# Patient Record
Sex: Male | Born: 1940 | Race: Black or African American | Hispanic: No | Marital: Married | Smoking: Never smoker
Health system: Southern US, Community
[De-identification: ages and names within clinical notes are randomized; demographics above are authoritative.]

## PROBLEM LIST (undated history)

## (undated) DIAGNOSIS — N400 Enlarged prostate without lower urinary tract symptoms: Secondary | ICD-10-CM

## (undated) DIAGNOSIS — E785 Hyperlipidemia, unspecified: Secondary | ICD-10-CM

## (undated) DIAGNOSIS — I2510 Atherosclerotic heart disease of native coronary artery without angina pectoris: Secondary | ICD-10-CM

## (undated) DIAGNOSIS — I10 Essential (primary) hypertension: Secondary | ICD-10-CM

## (undated) HISTORY — PX: HERNIA REPAIR: SHX51

---

## 2008-05-19 ENCOUNTER — Emergency Department (HOSPITAL_COMMUNITY): Admission: EM | Admit: 2008-05-19 | Discharge: 2008-05-20 | Payer: Self-pay | Admitting: Emergency Medicine

## 2011-01-12 ENCOUNTER — Inpatient Hospital Stay (INDEPENDENT_AMBULATORY_CARE_PROVIDER_SITE_OTHER)
Admission: RE | Admit: 2011-01-12 | Discharge: 2011-01-12 | Disposition: A | Discharge status: Home / Self Care | Payer: Medicare Other | Source: Ambulatory Visit | Attending: Emergency Medicine | Admitting: Emergency Medicine

## 2011-01-12 ENCOUNTER — Ambulatory Visit
Admission: RE | Admit: 2011-01-12 | Discharge: 2011-01-12 | Disposition: A | Discharge status: Home / Self Care | Payer: Medicare Other | Source: Ambulatory Visit | Attending: Emergency Medicine | Admitting: Emergency Medicine

## 2011-01-12 ENCOUNTER — Encounter: Payer: Self-pay | Admitting: Emergency Medicine

## 2011-01-12 ENCOUNTER — Other Ambulatory Visit: Payer: Self-pay | Admitting: Emergency Medicine

## 2011-01-12 DIAGNOSIS — IMO0002 Reserved for concepts with insufficient information to code with codable children: Secondary | ICD-10-CM

## 2011-01-12 DIAGNOSIS — R0602 Shortness of breath: Secondary | ICD-10-CM | POA: Insufficient documentation

## 2011-01-12 DIAGNOSIS — M25569 Pain in unspecified knee: Secondary | ICD-10-CM

## 2011-01-12 DIAGNOSIS — I10 Essential (primary) hypertension: Secondary | ICD-10-CM | POA: Insufficient documentation

## 2011-01-15 ENCOUNTER — Telehealth (INDEPENDENT_AMBULATORY_CARE_PROVIDER_SITE_OTHER): Payer: Self-pay | Admitting: Emergency Medicine

## 2011-07-09 LAB — URINALYSIS, ROUTINE W REFLEX MICROSCOPIC
Glucose, UA: NEGATIVE
Hgb urine dipstick: NEGATIVE
Specific Gravity, Urine: 1.038 — ABNORMAL HIGH
Urobilinogen, UA: 1

## 2011-09-13 NOTE — Progress Notes (Signed)
Summary: LEFT KNEE AND ANKLE PAIN   Vital Signs:  Patient Profile:   70 Years Old Male CC:      8 days ago awoke/got out of bed and left knee hurt Height:     67 inches Weight:      216.50 pounds O2 Sat:      98 % O2 treatment:    Room Air Temp:     97.6 degrees F oral Pulse rate:   55 / minute Resp:     16 per minute BP sitting:   163 / 84  Pt. in pain?   yes    Location:   left knee    Intensity:   10    Type:       aching  Vitals Entered By: Lavell Islam RN (January 12, 2011 10:55 AM)                   Updated Prior Medication List: CARDURA 1 MG TABS (DOXAZOSIN MESYLATE)   Current Allergies: No known allergies History of Present Illness History from: patient and wife Chief Complaint: 8 days ago awoke/got out of bed and left knee hurt History of Present Illness: 8 days ago awoke/got out of bed and left knee hurt. Pt complains of L knee pain. Location: L medial knee Onset: 8 days ago. Description/Quality of Pain: sharp and aching Intensity of pain: 8/10 when moving knee. 5/10 at rest. Modifying Factors: Tried linament, heating pad, advil, which helped a little. Trauma: None Symptoms Worse with: walking. And extending knee. Better with: rest. Associated sxs: pt isn't sure, but he noticed mild dyspnea earlier this am, but denies dyspnea now.  No wheezing. New L ankle swelling for several days. No calf or ankle pain. Denies chest pain, neck pain. No hx of gout. Only PMH is HTN, followed by Dr. Elesa Hacker, his PCP in [place]  No new medications. No hx of cardiac or pulm dz.  REVIEW OF SYSTEMS Constitutional Symptoms      Denies fever, chills, night sweats, weight loss, weight gain, and fatigue.  Eyes       Denies change in vision, eye pain, eye discharge, glasses, contact lenses, and eye surgery. Ear/Nose/Throat/Mouth       Complains of sinus problems.      Denies hearing loss/aids, change in hearing, ear pain, ear discharge, dizziness, frequent runny nose, frequent  nose bleeds, sore throat, hoarseness, and tooth pain or bleeding.  Respiratory       Complains of shortness of breath.      Denies dry cough, productive cough, wheezing, asthma, bronchitis, and emphysema/COPD.      Comments: mild SOB just today Cardiovascular       Denies murmurs, chest pain, and tires easily with exhertion.      Comments: on meds   Gastrointestinal       Denies stomach pain, nausea/vomiting, diarrhea, constipation, blood in bowel movements, and indigestion. Genitourniary       Denies painful urination, kidney stones, and loss of urinary control. Neurological       Denies paralysis, seizures, and fainting/blackouts. Musculoskeletal       Complains of joint pain, joint stiffness, decreased range of motion, swelling, and muscle weakness.      Denies muscle pain, redness, and gout.      Comments: left ankle to knee pain/edema Skin       Denies bruising, unusual mles/lumps or sores, and hair/skin or nail changes.  Psych  Denies mood changes, temper/anger issues, anxiety/stress, speech problems, depression, and sleep problems.  Past History:  Family History: Last updated: 01/12/2011 Family History of CAD Male 1st degree relative <60 Family History of CAD Male 1st degree relative <50  Social History: Last updated: 01/12/2011 non smoker Alcohol use-no Drug use-no  Past Medical History: Hypertension  Past Surgical History: Inguinal herniorrhaphy right  Family History: Family History of CAD Male 1st degree relative <60 Family History of CAD Male 1st degree relative <50  Social History: non smoker Alcohol use-no Drug use-no Drug Use:  no Physical Exam General appearance: well developed, well nourished, overwt male,no acute distress. Here with wife. Head: normocephalic, atraumatic Eyes: conjunctivae and lids normal. No icterus. Oral/Pharynx: tongue normal, posterior pharynx without erythema or exudate Neck: neck supple,  trachea midline, no masses.  Carotids 2 + and =, without bruits. No JVD. Chest/Lungs: no rales, wheezes, or rhonchi bilateral, breath sounds equal without effort Heart: bradycardic, regular rate and  rhythm, no murmur, gallops, or rubs Abdomen: soft, nt Extremities: L medial knee: moderatley swollen, tender. No obvious effusion. Patella nontender. Knee joint without instability. Neg anterior drawer sign. Mild + crepitus on flexion. + pain ellicited on flexion/extension. FROM. Equivically +  McMurray's sign. Neurological: grossly intact and non-focal Skin: no obvious rashes or lesions Note: Pt was observed walking , limps favoring L knee. No dyspnea on exertion observed. Left leg exam (continued): No calf swelling, tenderness, redness, heat or cords. Left ankle: +1 edema. (No edema R ankle). L ankle nt, from. R and L DP pulses, n/v distally intact. Pulse ox (RA) 98% Assessment New Problems: PES ANSERINUS TENDINITIS OR BURSITIS (ICD-726.61) DYSPNEA (ICD-786.05) KNEE PAIN, LEFT, ACUTE (ICD-719.46) FAMILY HISTORY OF CAD MALE 1ST DEGREE RELATIVE <50 (ICD-V17.3) FAMILY HISTORY OF CAD MALE 1ST DEGREE RELATIVE <60 (ICD-V16.49) HYPERTENSION (ICD-401.9)  EKG: Sinus Bradycardia (consistent with pt taking a beta blocker for HTN). No acute abnormalities. CXR: "Findings: The heart size and mediastinal contours are normal. There is a mild convex right thoracic scoliosis.  There is probable mild scarring or atelectasis in both lungs.  No confluent airspace opacity, edema or pleural effusion is identified.  There are no acute osseous findings.  Mild degenerative changes of the thoracic spine are noted. IMPRESSION: No acute cardiopulmonary process."  Xray L knee: "Findings: The mineralization and alignment are normal.  There is no evidence of acute fracture or dislocation.  The joint spaces are preserved.  There is minimal patellar spurring.  There is a small knee joint effusion. IMPRESSION: No acute osseous findings.   Small knee joint effusion."  Clinically, likely dx for L knee pain is pes anserine bursitis of L knee. (No calf tenderness or cords or heat to suggest DVT). No evidence for any acute cardioresp cause. No evidence of CHF.   Plan New Medications/Changes: FUROSEMIDE 40 MG TABS (FUROSEMIDE) 1 by mouth daily as needed swelling/fluid in leg(s)  #10 x 0, 01/12/2011, Lajean Manes MD ETODOLAC 400 MG TABS (ETODOLAC) 1 two times a day pc as needed knee pain  #20 x 0, 01/12/2011, Lajean Manes MD  New Orders: T-DG Chest 2 View [71020] T-DG Knee Complete 4 Views*L* [73564] Est. Patient Level IV [16109] EKG w/ Interpretation [93000] Knee Sleeve [L1825] Planning Comments:   Left knee sleeve.heat. F/U ortho to be scheduled within 1 week.  Continue current BP meds as rx'd by Dr.Church, but I explained he needs BP reck at Dr. Elesa Hacker within 2 weeks. Discussed tx at length. Risks, benefits, alternatives discussed. Pt  and wife. voiced understanding and agreement.  Follow Up: Dr Elesa Hacker, his PCP within 1-2 weeks, sooner prn  The patient and/or caregiver has been counseled thoroughly with regard to medications prescribed including dosage, schedule, interactions, rationale for use, and possible side effects and they verbalize understanding.  Diagnoses and expected course of recovery discussed and will return if not improved as expected or if the condition worsens. Patient and/or caregiver verbalized understanding.  Prescriptions: FUROSEMIDE 40 MG TABS (FUROSEMIDE) 1 by mouth daily as needed swelling/fluid in leg(s)  #10 x 0   Entered and Authorized by:   Lajean Manes MD   Signed by:   Lajean Manes MD on 01/12/2011   Method used:   Handwritten   RxID:   [id number] ETODOLAC 400 MG TABS (ETODOLAC) 1 two times a day pc as needed knee pain  #20 x 0   Entered and Authorized by:   Lajean Manes MD   Signed by:   Lajean Manes MD on 01/12/2011   Method used:   Handwritten   RxID:   [id number]   Orders  Added: 1)  T-DG Chest 2 View [71020] 2)  T-DG Knee Complete 4 Views*L* [73564] 3)  Est. Patient Level IV [84696] 4)  EKG w/ Interpretation [93000] 5)  Knee Sleeve [L1825]

## 2011-09-13 NOTE — Telephone Encounter (Signed)
  Phone Note Outgoing Call   Call placed by: Lavell Islam RN,  January 15, 2011 10:20 AM Summary of Call: Patient has returned to work today; wife states he is walking normally and no further SOB. Instructed wife to have patient call after work if he has any questions/concerns. Initial call taken by: Lavell Islam RN,  January 15, 2011 10:21 AM

## 2012-10-28 ENCOUNTER — Encounter (HOSPITAL_COMMUNITY): Payer: Self-pay | Admitting: *Deleted

## 2012-10-28 DIAGNOSIS — Z79899 Other long term (current) drug therapy: Secondary | ICD-10-CM | POA: Insufficient documentation

## 2012-10-28 DIAGNOSIS — I10 Essential (primary) hypertension: Secondary | ICD-10-CM | POA: Insufficient documentation

## 2012-10-28 NOTE — ED Notes (Signed)
High bp for x 8 days. Here b/c concerned about htn; did not call pcp.

## 2012-10-29 ENCOUNTER — Emergency Department (HOSPITAL_COMMUNITY)
Admission: EM | Admit: 2012-10-29 | Discharge: 2012-10-29 | Disposition: A | Discharge status: Home / Self Care | Payer: Medicare Other | Attending: Emergency Medicine | Admitting: Emergency Medicine

## 2012-10-29 DIAGNOSIS — I10 Essential (primary) hypertension: Secondary | ICD-10-CM

## 2012-10-29 HISTORY — DX: Essential (primary) hypertension: I10

## 2012-10-29 MED ORDER — LISINOPRIL 10 MG PO TABS
10.0000 mg | ORAL_TABLET | Freq: Once | ORAL | Status: AC
  Administered 2012-10-29: 10 mg via ORAL
  Filled 2012-10-29: qty 1

## 2012-10-29 MED ORDER — LISINOPRIL 10 MG PO TABS: 10.0000 mg | ORAL_TABLET | Freq: Every day | ORAL | Status: DC

## 2012-10-29 NOTE — ED Notes (Signed)
Patient presents stating that over the last 8 days his pressure has been going up.  Moved his prescription to [facility] from [facility].

## 2012-10-29 NOTE — ED Provider Notes (Addendum)
History     CSN: [id number]  Arrival date & time 10/28/12  2319   First MD Initiated Contact with Patient 10/29/12 0146      Chief Complaint  Patient presents with  . Hypertension    (Consider location/radiation/quality/duration/timing/severity/associated sxs/prior treatment) HPI Comments: 72 year old male who presents with a complaint of high blood pressure. The patient states that he has recently changed his prescription for metoprolol to an extended release version of the same medication. He has also changed pharmacies. Over the last several days he has noticed that his blood pressure has crept up from 140-170 and now 180 today. On arrival his blood pressure is 200/80. He denies any symptoms including headache, chest pain, shortness of breath, weakness, numbness, difficulty walking or problems with balance vision or speech. His blood pressure is gradually worsening, constant, nothing seems to make it better or worse despite taking his medications in a compliant manner  Patient is a 72 y.o. male presenting with hypertension. The history is provided by the patient and the spouse.  Hypertension    Past Medical History  Diagnosis Date  . Hypertension     No past surgical history on file.  No family history on file.  History  Substance Use Topics  . Smoking status: Never Smoker   . Smokeless tobacco: Not on file  . Alcohol Use: No      Review of Systems  All other systems reviewed and are negative.    Allergies  Review of patient's allergies indicates not on file.  Home Medications   Current Outpatient Rx  Name  Route  Sig  Dispense  Refill  . LISINOPRIL 10 MG PO TABS   Oral   Take 1 tablet (10 mg total) by mouth daily.   30 tablet   1     BP 204/84  Pulse 55  Temp 98 F (36.7 C) (Oral)  Resp 18  SpO2 99%  Physical Exam  Nursing note and vitals reviewed. Constitutional: He appears well-developed and well-nourished. No distress.  HENT:  Head:  Normocephalic and atraumatic.  Mouth/Throat: Oropharynx is clear and moist. No oropharyngeal exudate.  Eyes: Conjunctivae normal and EOM are normal. Pupils are equal, round, and reactive to light. Right eye exhibits no discharge. Left eye exhibits no discharge. No scleral icterus.  Neck: Normal range of motion. Neck supple. No JVD present. No thyromegaly present.  Cardiovascular: Normal rate, regular rhythm, normal heart sounds and intact distal pulses.  Exam reveals no gallop and no friction rub.   No murmur heard. Pulmonary/Chest: Effort normal and breath sounds normal. No respiratory distress. He has no wheezes. He has no rales.  Abdominal: Soft. Bowel sounds are normal. He exhibits no distension and no mass. There is no tenderness.  Musculoskeletal: Normal range of motion. He exhibits no edema and no tenderness.  Lymphadenopathy:    He has no cervical adenopathy.  Neurological: He is alert. Coordination normal.       Neurologic exam:  Speech clear, pupils equal round reactive to light, extraocular movements intact  Normal peripheral visual fields Cranial nerves III through XII normal including no facial droop Follows commands, moves all extremities x4, normal strength to bilateral upper and lower extremities at all major muscle groups including grip Sensation normal to light touch and pinprick Coordination intact, no limb ataxia, finger-nose-finger normal Rapid alternating movements normal No pronator drift Gait normal  Skin: Skin is warm and dry. No rash noted. No erythema.  Psychiatric: He has a normal mood and  affect. His behavior is normal.    ED Course  Procedures (including critical care time)  Labs Reviewed - No data to display No results found.   1. Hypertension       MDM  The patient does have a mild bradycardia with a pulse of 55, blood pressure 204/84. He is asymptomatic and at this time there is no indication for further testing however I will start lisinopril  once a day. I described to the patient the indications for return including side effects of the medications including angioedema. He will followup with his family Dr. for a repeat evaluation within 2 weeks.   ED ECG REPORT  I personally interpreted this EKG   Date: 10/29/2012   Rate: 52  Rhythm: sinus bradycardia  QRS Axis: normal  Intervals: normal  ST/T Wave abnormalities: normal  Conduction Disutrbances:none  Narrative Interpretation:   Old EKG Reviewed: none available      Vida Roller, MD 10/29/12 6213  Vida Roller, MD 10/29/12 [phone]

## 2012-10-29 NOTE — ED Notes (Signed)
BP left arm 171/82  BP right arm 156/77

## 2012-10-29 NOTE — ED Notes (Signed)
BP right arm 155/83  BP left arm 168/84

## 2015-03-02 ENCOUNTER — Emergency Department (HOSPITAL_BASED_OUTPATIENT_CLINIC_OR_DEPARTMENT_OTHER)
Admission: EM | Admit: 2015-03-02 | Discharge: 2015-03-02 | Disposition: A | Discharge status: Home / Self Care | Payer: Medicare Other | Attending: Emergency Medicine | Admitting: Emergency Medicine

## 2015-03-02 ENCOUNTER — Encounter (HOSPITAL_BASED_OUTPATIENT_CLINIC_OR_DEPARTMENT_OTHER): Payer: Self-pay | Admitting: *Deleted

## 2015-03-02 DIAGNOSIS — I10 Essential (primary) hypertension: Secondary | ICD-10-CM | POA: Insufficient documentation

## 2015-03-02 DIAGNOSIS — Y9289 Other specified places as the place of occurrence of the external cause: Secondary | ICD-10-CM | POA: Insufficient documentation

## 2015-03-02 DIAGNOSIS — S00412A Abrasion of left ear, initial encounter: Secondary | ICD-10-CM | POA: Insufficient documentation

## 2015-03-02 DIAGNOSIS — Z79899 Other long term (current) drug therapy: Secondary | ICD-10-CM | POA: Diagnosis not present

## 2015-03-02 DIAGNOSIS — X58XXXA Exposure to other specified factors, initial encounter: Secondary | ICD-10-CM | POA: Insufficient documentation

## 2015-03-02 DIAGNOSIS — S0991XA Unspecified injury of ear, initial encounter: Secondary | ICD-10-CM | POA: Diagnosis present

## 2015-03-02 DIAGNOSIS — Z7982 Long term (current) use of aspirin: Secondary | ICD-10-CM | POA: Diagnosis not present

## 2015-03-02 DIAGNOSIS — Y998 Other external cause status: Secondary | ICD-10-CM | POA: Diagnosis not present

## 2015-03-02 DIAGNOSIS — Y9389 Activity, other specified: Secondary | ICD-10-CM | POA: Diagnosis not present

## 2015-03-02 NOTE — Discharge Instructions (Signed)
°  SEEK IMMEDIATE MEDICAL CARE IF:  You develop severe pain.  You develop a fever or pus like drainage.  You have increased hearing loss or other problems. MAKE SURE YOU:   Understand these instructions.  Will watch your condition.  Will get help right away if you are not doing well or get worse. Document Released: 09/27/2005 Document Revised: 12/20/2011 Document Reviewed: 03/16/2007 [facility] Patient Information 2015 [place], [place]. This information is not intended to replace advice given to you by your health care provider. Make sure you discuss any questions you have with your health care provider.

## 2015-03-02 NOTE — ED Provider Notes (Signed)
CSN: [id number]     Arrival date & time 03/02/15  1621 History  This chart was scribed for Ronald Fraise, MD by Ronald Robinson, ED Scribe. This patient was seen in room MH06/MH06 and the patient's care was started 4:34 PM.  Chief Complaint  Patient presents with  . Otalgia   Patient is a 74 y.o. male presenting with ear pain. The history is provided by the patient. No language interpreter was used.  Otalgia Location:  Left Quality:  Aching Severity:  Moderate Associated symptoms: no fever and no headaches     HPI Comments: Ronald Robinson is a 74 y.o. male who presents to the Emergency Department complaining of otalgia in his left ear that occurred last night. Pt reports he was cleaning his ear with a q-tip last night and he noticed some blood on the tip. He states he stuck the q-tip back in his ear and noticed a "scab" that came out. He reports there has been no bleeding in his ear since then. He denies associated hearing loss, headache or fever. He states he applied hydrogen peroxide to affected ear with a q-tip afterwards. Pt denies prior surgical history on his ear.   Past Medical History  Diagnosis Date  . Hypertension    Past Surgical History  Procedure Laterality Date  . Hernia repair     No family history on file. History  Substance Use Topics  . Smoking status: Never Smoker   . Smokeless tobacco: Never Used  . Alcohol Use: Yes     Comment: rare beer  Review of Systems  Constitutional: Negative for fever.  HENT: Positive for ear pain.   Neurological: Negative for headaches.   Allergies  Review of patient's allergies indicates no known allergies.  Home Medications   Prior to Admission medications   Medication Sig Start Date End Date Taking? Authorizing Provider  amLODipine (NORVASC) 10 MG tablet Take 10 mg by mouth daily.   Yes Historical Provider, MD  finasteride (PROSCAR) 5 MG tablet Take 5 mg by mouth daily.   Yes Historical Provider, MD  tamsulosin (FLOMAX)  0.4 MG CAPS capsule Take 0.4 mg by mouth.   Yes Historical Provider, MD  aspirin 325 MG tablet Take 325 mg by mouth daily as needed. For pain    Historical Provider, MD  lisinopril (PRINIVIL,ZESTRIL) 10 MG tablet Take 1 tablet (10 mg total) by mouth daily. 10/29/12   Noemi Chapel, MD  metoprolol succinate (TOPROL-XL) 25 MG 24 hr tablet Take 25 mg by mouth daily.    Historical Provider, MD   Triage Vitals: BP 164/72 mmHg  Pulse 61  Temp(Src) 98.1 F (36.7 C) (Oral)  Resp 18  Ht 5\' 8"  (1.727 m)  Wt 198 lb (89.812 kg)  BMI 30.11 kg/m2  SpO2 98% Physical Exam   CONSTITUTIONAL: Well developed/well nourished HEAD: Normocephalic/atraumatic EYES: EOMI/PERRL ENMT: Mucous membranes moist, small abrasion in left ear canal, no active bleeding, left TM in tact  NECK: supple no meningeal signs SPINE/BACK:entire spine nontender CV: S1/S2 noted, no murmurs/rubs/gallops noted LUNGS: Lungs are clear to auscultation bilaterally, no apparent distress NEURO: Pt is awake/alert/appropriate, moves all extremitiesx4.  No facial droop.   EXTREMITIES: pulses normal/equal, full ROM SKIN: warm, color normal PSYCH: no abnormalities of mood noted, alert and oriented to situation  ED Course  Procedures  DIAGNOSTIC STUDIES: Oxygen Saturation is 98% on RA, normal by my interpretation.    COORDINATION OF CARE: 4:36 PM Discussed plans to discharge. Advised patient to decrease use of  Q-tips. Discussed treatment plan to not insert anything into ear, leave as is with patient at bedside and pt agreed to plan.  MDM   Final diagnoses:  Abrasion of left ear canal, initial encounter    Nursing notes including past medical history and social history reviewed and considered in documentation  I personally performed the services described in this documentation, which was scribed in my presence. The recorded information has been reviewed and is accurate.       Ronald Fraise, MD 03/02/15 [phone]

## 2015-03-02 NOTE — ED Notes (Signed)
Pt reports left ear pain after cleaning ear with a qtip last evening

## 2016-11-24 ENCOUNTER — Encounter (HOSPITAL_BASED_OUTPATIENT_CLINIC_OR_DEPARTMENT_OTHER): Payer: Self-pay | Admitting: Emergency Medicine

## 2016-11-24 ENCOUNTER — Encounter (HOSPITAL_COMMUNITY)
Admission: EM | Disposition: A | Discharge status: Home / Self Care | Payer: Self-pay | Source: Home / Self Care | Attending: Cardiology

## 2016-11-24 ENCOUNTER — Inpatient Hospital Stay (HOSPITAL_BASED_OUTPATIENT_CLINIC_OR_DEPARTMENT_OTHER)
Admission: EM | Admit: 2016-11-24 | Discharge: 2016-11-26 | DRG: 281 | Disposition: A | Discharge status: Home / Self Care | Payer: Medicare Other | Attending: Cardiology | Admitting: Cardiology

## 2016-11-24 ENCOUNTER — Emergency Department (HOSPITAL_BASED_OUTPATIENT_CLINIC_OR_DEPARTMENT_OTHER): Payer: Medicare Other

## 2016-11-24 DIAGNOSIS — I471 Supraventricular tachycardia: Secondary | ICD-10-CM | POA: Diagnosis present

## 2016-11-24 DIAGNOSIS — I25110 Atherosclerotic heart disease of native coronary artery with unstable angina pectoris: Secondary | ICD-10-CM | POA: Diagnosis not present

## 2016-11-24 DIAGNOSIS — I2583 Coronary atherosclerosis due to lipid rich plaque: Secondary | ICD-10-CM

## 2016-11-24 DIAGNOSIS — I10 Essential (primary) hypertension: Secondary | ICD-10-CM | POA: Diagnosis present

## 2016-11-24 DIAGNOSIS — I214 Non-ST elevation (NSTEMI) myocardial infarction: Principal | ICD-10-CM | POA: Diagnosis present

## 2016-11-24 DIAGNOSIS — Z7982 Long term (current) use of aspirin: Secondary | ICD-10-CM

## 2016-11-24 DIAGNOSIS — I2510 Atherosclerotic heart disease of native coronary artery without angina pectoris: Secondary | ICD-10-CM | POA: Diagnosis present

## 2016-11-24 DIAGNOSIS — N400 Enlarged prostate without lower urinary tract symptoms: Secondary | ICD-10-CM | POA: Diagnosis present

## 2016-11-24 DIAGNOSIS — Z8249 Family history of ischemic heart disease and other diseases of the circulatory system: Secondary | ICD-10-CM | POA: Diagnosis not present

## 2016-11-24 DIAGNOSIS — Z9889 Other specified postprocedural states: Secondary | ICD-10-CM

## 2016-11-24 DIAGNOSIS — Z79899 Other long term (current) drug therapy: Secondary | ICD-10-CM | POA: Diagnosis not present

## 2016-11-24 DIAGNOSIS — I480 Paroxysmal atrial fibrillation: Secondary | ICD-10-CM | POA: Diagnosis present

## 2016-11-24 DIAGNOSIS — E785 Hyperlipidemia, unspecified: Secondary | ICD-10-CM | POA: Diagnosis present

## 2016-11-24 HISTORY — DX: Atherosclerotic heart disease of native coronary artery without angina pectoris: I25.10

## 2016-11-24 HISTORY — PX: LEFT HEART CATH AND CORONARY ANGIOGRAPHY: CATH118249

## 2016-11-24 HISTORY — DX: Benign prostatic hyperplasia without lower urinary tract symptoms: N40.0

## 2016-11-24 LAB — TSH: TSH: 1.911 u[IU]/mL (ref 0.350–4.500)

## 2016-11-24 LAB — COMPREHENSIVE METABOLIC PANEL
ALT: 29 U/L (ref 17–63)
AST: 37 U/L (ref 15–41)
Albumin: 3.8 g/dL (ref 3.5–5.0)
Alkaline Phosphatase: 68 U/L (ref 38–126)
Anion gap: 7 (ref 5–15)
BUN: 20 mg/dL (ref 6–20)
CALCIUM: 9.3 mg/dL (ref 8.9–10.3)
CO2: 25 mmol/L (ref 22–32)
Chloride: 104 mmol/L (ref 101–111)
Creatinine, Ser: 0.65 mg/dL (ref 0.61–1.24)
GFR calc non Af Amer: >60 mL/min (ref >60)
GLUCOSE: 145 mg/dL — AB (ref 65–99)
POTASSIUM: 3.7 mmol/L (ref 3.5–5.1)
Sodium: 136 mmol/L (ref 135–145)
TOTAL PROTEIN: 7 g/dL (ref 6.5–8.1)
Total Bilirubin: 0.7 mg/dL (ref 0.3–1.2)

## 2016-11-24 LAB — MAGNESIUM: Magnesium: 2 mg/dL (ref 1.7–2.4)

## 2016-11-24 LAB — CBC WITH DIFFERENTIAL/PLATELET
Basophils Absolute: 0 10*3/uL (ref 0.0–0.1)
Basophils Relative: 0 %
Eosinophils Absolute: 0.1 10*3/uL (ref 0.0–0.7)
Eosinophils Relative: 2 %
HEMATOCRIT: 46.6 % (ref 39.0–52.0)
Hemoglobin: 15.7 g/dL (ref 13.0–17.0)
LYMPHS PCT: 32 %
Lymphs Abs: 1.8 10*3/uL (ref 0.7–4.0)
MCH: 27.3 pg (ref 26.0–34.0)
MCHC: 33.7 g/dL (ref 30.0–36.0)
MCV: 81 fL (ref 78.0–100.0)
MONO ABS: 0.6 10*3/uL (ref 0.1–1.0)
MONOS PCT: 10 %
NEUTROS ABS: 3.3 10*3/uL (ref 1.7–7.7)
Neutrophils Relative %: 56 %
Platelets: 215 10*3/uL (ref 150–400)
RBC: 5.75 MIL/uL (ref 4.22–5.81)
RDW: 14.5 % (ref 11.5–15.5)
WBC: 5.8 10*3/uL (ref 4.0–10.5)

## 2016-11-24 LAB — RAPID URINE DRUG SCREEN, HOSP PERFORMED
AMPHETAMINES: NOT DETECTED
BARBITURATES: NOT DETECTED
BENZODIAZEPINES: NOT DETECTED
COCAINE: NOT DETECTED
Opiates: NOT DETECTED
Tetrahydrocannabinol: NOT DETECTED

## 2016-11-24 LAB — TROPONIN I
TROPONIN I: 2.13 ng/mL — AB (ref <0.03)
TROPONIN I: 2.76 ng/mL — AB (ref <0.03)
Troponin I: 1.34 ng/mL (ref <0.03)
Troponin I: 4.78 ng/mL (ref <0.03)

## 2016-11-24 LAB — ETHANOL

## 2016-11-24 LAB — PROTIME-INR
INR: 0.99
Prothrombin Time: 13.1 seconds (ref 11.4–15.2)

## 2016-11-24 SURGERY — LEFT HEART CATH AND CORONARY ANGIOGRAPHY

## 2016-11-24 MED ORDER — FENTANYL CITRATE (PF) 100 MCG/2ML IJ SOLN
INTRAMUSCULAR | Status: AC
  Filled 2016-11-24: qty 2

## 2016-11-24 MED ORDER — ONDANSETRON HCL 4 MG/2ML IJ SOLN: 4.0000 mg | Freq: Four times a day (QID) | INTRAMUSCULAR | Status: DC | PRN

## 2016-11-24 MED ORDER — VERAPAMIL HCL 2.5 MG/ML IV SOLN
INTRAVENOUS | Status: AC
  Filled 2016-11-24: qty 2

## 2016-11-24 MED ORDER — NITROGLYCERIN 2 % TD OINT
0.5000 [in_us] | TOPICAL_OINTMENT | Freq: Four times a day (QID) | TRANSDERMAL | Status: DC
  Administered 2016-11-24 – 2016-11-25 (×5): 0.5 [in_us] via TOPICAL
  Filled 2016-11-24 (×2): qty 30

## 2016-11-24 MED ORDER — IOPAMIDOL (ISOVUE-370) INJECTION 76%
INTRAVENOUS | Status: AC
  Filled 2016-11-24: qty 50

## 2016-11-24 MED ORDER — LIDOCAINE HCL (PF) 1 % IJ SOLN
INTRAMUSCULAR | Status: DC | PRN
  Administered 2016-11-24: 2 mL via INTRADERMAL

## 2016-11-24 MED ORDER — SODIUM CHLORIDE 0.9% FLUSH
3.0000 mL | Freq: Two times a day (BID) | INTRAVENOUS | Status: DC
  Administered 2016-11-24 – 2016-11-25 (×3): 3 mL via INTRAVENOUS

## 2016-11-24 MED ORDER — ASPIRIN 81 MG PO CHEW: 81.0000 mg | CHEWABLE_TABLET | Freq: Every day | ORAL | Status: DC

## 2016-11-24 MED ORDER — IOPAMIDOL (ISOVUE-370) INJECTION 76%
INTRAVENOUS | Status: AC
  Filled 2016-11-24: qty 100

## 2016-11-24 MED ORDER — ATORVASTATIN CALCIUM 40 MG PO TABS: 40.0000 mg | ORAL_TABLET | Freq: Every day | ORAL | Status: DC

## 2016-11-24 MED ORDER — ZOLPIDEM TARTRATE 5 MG PO TABS: 5.0000 mg | ORAL_TABLET | Freq: Every evening | ORAL | Status: DC | PRN

## 2016-11-24 MED ORDER — ACETAMINOPHEN 325 MG PO TABS
650.0000 mg | ORAL_TABLET | ORAL | Status: DC | PRN
  Administered 2016-11-25: 650 mg via ORAL
  Filled 2016-11-24: qty 2

## 2016-11-24 MED ORDER — SODIUM CHLORIDE 0.9 % IV SOLN: 250.0000 mL | INTRAVENOUS | Status: DC | PRN

## 2016-11-24 MED ORDER — HEPARIN SODIUM (PORCINE) 1000 UNIT/ML IJ SOLN
INTRAMUSCULAR | Status: AC
  Filled 2016-11-24: qty 1

## 2016-11-24 MED ORDER — SODIUM CHLORIDE 0.9 % IV SOLN
INTRAVENOUS | Status: DC
  Administered 2016-11-24: 75 mL/h via INTRAVENOUS
  Administered 2016-11-24: 12:00:00 via INTRAVENOUS

## 2016-11-24 MED ORDER — MIDAZOLAM HCL 2 MG/2ML IJ SOLN
INTRAMUSCULAR | Status: AC
  Filled 2016-11-24: qty 2

## 2016-11-24 MED ORDER — SODIUM CHLORIDE 0.9% FLUSH: 3.0000 mL | INTRAVENOUS | Status: DC | PRN

## 2016-11-24 MED ORDER — ATORVASTATIN CALCIUM 80 MG PO TABS
80.0000 mg | ORAL_TABLET | Freq: Every day | ORAL | Status: DC
  Administered 2016-11-24 – 2016-11-26 (×3): 80 mg via ORAL
  Filled 2016-11-24 (×3): qty 1

## 2016-11-24 MED ORDER — MIDAZOLAM HCL 2 MG/2ML IJ SOLN
INTRAMUSCULAR | Status: DC | PRN
  Administered 2016-11-24: 1 mg via INTRAVENOUS

## 2016-11-24 MED ORDER — ALPRAZOLAM 0.25 MG PO TABS: 0.2500 mg | ORAL_TABLET | Freq: Two times a day (BID) | ORAL | Status: DC | PRN

## 2016-11-24 MED ORDER — ASPIRIN 325 MG PO TABS
325.0000 mg | ORAL_TABLET | Freq: Once | ORAL | Status: AC
  Administered 2016-11-24: 325 mg via ORAL
  Filled 2016-11-24: qty 1

## 2016-11-24 MED ORDER — RANOLAZINE ER 500 MG PO TB12
500.0000 mg | ORAL_TABLET | Freq: Two times a day (BID) | ORAL | Status: DC
  Administered 2016-11-24 – 2016-11-26 (×4): 500 mg via ORAL
  Filled 2016-11-24 (×5): qty 1

## 2016-11-24 MED ORDER — HEPARIN (PORCINE) IN NACL 2-0.9 UNIT/ML-% IJ SOLN
INTRAMUSCULAR | Status: DC | PRN
  Administered 2016-11-24: 1000 mL

## 2016-11-24 MED ORDER — HEPARIN (PORCINE) IN NACL 100-0.45 UNIT/ML-% IJ SOLN
1100.0000 [IU]/h | INTRAMUSCULAR | Status: DC
  Filled 2016-11-24: qty 250

## 2016-11-24 MED ORDER — ASPIRIN EC 81 MG PO TBEC
81.0000 mg | DELAYED_RELEASE_TABLET | Freq: Every day | ORAL | Status: DC
  Administered 2016-11-25 – 2016-11-26 (×2): 81 mg via ORAL
  Filled 2016-11-24 (×3): qty 1

## 2016-11-24 MED ORDER — ISOSORBIDE MONONITRATE ER 30 MG PO TB24
30.0000 mg | ORAL_TABLET | Freq: Every day | ORAL | Status: DC
  Administered 2016-11-24 – 2016-11-26 (×3): 30 mg via ORAL
  Filled 2016-11-24 (×4): qty 1

## 2016-11-24 MED ORDER — LIDOCAINE HCL (PF) 1 % IJ SOLN
INTRAMUSCULAR | Status: AC
  Filled 2016-11-24: qty 30

## 2016-11-24 MED ORDER — DILTIAZEM HCL 25 MG/5ML IV SOLN
10.0000 mg | Freq: Once | INTRAVENOUS | Status: DC
  Filled 2016-11-24: qty 5

## 2016-11-24 MED ORDER — CLOPIDOGREL BISULFATE 300 MG PO TABS
300.0000 mg | ORAL_TABLET | Freq: Once | ORAL | Status: AC
  Administered 2016-11-24: 300 mg via ORAL
  Filled 2016-11-24 (×2): qty 1

## 2016-11-24 MED ORDER — DIAZEPAM 5 MG PO TABS: 5.0000 mg | ORAL_TABLET | Freq: Four times a day (QID) | ORAL | Status: DC | PRN

## 2016-11-24 MED ORDER — ENOXAPARIN SODIUM 100 MG/ML ~~LOC~~ SOLN
1.0000 mg/kg | Freq: Once | SUBCUTANEOUS | Status: AC
  Administered 2016-11-24: 85 mg via SUBCUTANEOUS
  Filled 2016-11-24: qty 1

## 2016-11-24 MED ORDER — IOPAMIDOL (ISOVUE-370) INJECTION 76%
INTRAVENOUS | Status: DC | PRN
  Administered 2016-11-24: 110 mL via INTRA_ARTERIAL

## 2016-11-24 MED ORDER — ACETAMINOPHEN 325 MG PO TABS: 650.0000 mg | ORAL_TABLET | ORAL | Status: DC | PRN

## 2016-11-24 MED ORDER — VERAPAMIL HCL 2.5 MG/ML IV SOLN
INTRAVENOUS | Status: DC | PRN
  Administered 2016-11-24: 10 mL via INTRA_ARTERIAL

## 2016-11-24 MED ORDER — HEPARIN SODIUM (PORCINE) 1000 UNIT/ML IJ SOLN
INTRAMUSCULAR | Status: DC | PRN
  Administered 2016-11-24: 4500 [IU] via INTRAVENOUS

## 2016-11-24 MED ORDER — NITROGLYCERIN 0.4 MG SL SUBL: 0.4000 mg | SUBLINGUAL_TABLET | SUBLINGUAL | Status: DC | PRN

## 2016-11-24 MED ORDER — HEPARIN (PORCINE) IN NACL 100-0.45 UNIT/ML-% IJ SOLN
1050.0000 [IU]/h | INTRAMUSCULAR | Status: DC
  Administered 2016-11-24: 1100 [IU]/h via INTRAVENOUS
  Administered 2016-11-25: 1200 [IU]/h via INTRAVENOUS
  Filled 2016-11-24 (×2): qty 250

## 2016-11-24 MED ORDER — SODIUM CHLORIDE 0.9 % IV SOLN
INTRAVENOUS | Status: DC
  Administered 2016-11-24: 10:00:00 via INTRAVENOUS

## 2016-11-24 MED ORDER — FENTANYL CITRATE (PF) 100 MCG/2ML IJ SOLN
INTRAMUSCULAR | Status: DC | PRN
  Administered 2016-11-24: 25 ug via INTRAVENOUS

## 2016-11-24 MED ORDER — HEPARIN (PORCINE) IN NACL 2-0.9 UNIT/ML-% IJ SOLN
INTRAMUSCULAR | Status: AC
  Filled 2016-11-24: qty 1000

## 2016-11-24 SURGICAL SUPPLY — 16 items
CATH EXPO 5F FL3.5 (CATHETERS) ×3 IMPLANT
CATH EXPO 5FR ANG PIGTAIL 145 (CATHETERS) ×3 IMPLANT
CATH LAUNCHER 5F EBU3.0 (CATHETERS) ×1 IMPLANT
CATH LAUNCHER 5F RADL (CATHETERS) ×1 IMPLANT
CATH OPTITORQUE TIG 4.0 5F (CATHETERS) ×3 IMPLANT
CATHETER LAUNCHER 5F EBU3.0 (CATHETERS) ×3
CATHETER LAUNCHER 5F RADL (CATHETERS) ×3
DEVICE RAD COMP TR BAND LRG (VASCULAR PRODUCTS) ×3 IMPLANT
GLIDESHEATH SLEND SS 6F .021 (SHEATH) ×3 IMPLANT
GUIDEWIRE INQWIRE 1.5J.035X260 (WIRE) ×1 IMPLANT
INQWIRE 1.5J .035X260CM (WIRE) ×3
KIT HEART LEFT (KITS) ×3 IMPLANT
PACK CARDIAC CATHETERIZATION (CUSTOM PROCEDURE TRAY) ×3 IMPLANT
SYR MEDRAD MARK V 150ML (SYRINGE) ×3 IMPLANT
TRANSDUCER W/STOPCOCK (MISCELLANEOUS) ×3 IMPLANT
TUBING CIL FLEX 10 FLL-RA (TUBING) ×3 IMPLANT

## 2016-11-24 NOTE — Progress Notes (Signed)
ANTICOAGULATION CONSULT NOTE - Initial Consult  Pharmacy Consult for heparin Indication: chest pain/ACS  No Known Allergies  Patient Measurements: Height: 5\' 7"  (170.2 cm) Weight: 189 lb 12.8 oz (86.1 kg) IBW/kg (Calculated) : 66.1 Heparin Dosing Weight: 85kg  Vital Signs: Temp: 97.5 F (36.4 C) (02/14 0604) Temp Source: Oral (02/14 0604) BP: 149/71 (02/14 0604) Pulse Rate: 58 (02/14 0604)  Labs:  Recent Labs  11/24/16 0012 11/24/16 0420  HGB 15.7  --   HCT 46.6  --   PLT 215  --   LABPROT 13.1  --   INR 0.99  --   CREATININE 0.65  --   TROPONINI 1.34* 2.13*    Estimated Creatinine Clearance: 84.9 mL/min (by C-G formula based on SCr of 0.65 mg/dL).   Medical History: Past Medical History:  Diagnosis Date  . Enlarged prostate   . Hypertension     Medications:  Prescriptions Prior to Admission  Medication Sig Dispense Refill Last Dose  . amLODipine (NORVASC) 10 MG tablet Take 10 mg by mouth daily.     Marland Kitchen aspirin 325 MG tablet Take 325 mg by mouth daily as needed. For pain   Past Month at Unknown  . finasteride (PROSCAR) 5 MG tablet Take 5 mg by mouth daily.     Marland Kitchen lisinopril (PRINIVIL,ZESTRIL) 10 MG tablet Take 1 tablet (10 mg total) by mouth daily. 30 tablet 1   . metoprolol succinate (TOPROL-XL) 25 MG 24 hr tablet Take 25 mg by mouth daily.   10/28/2012 at 1800  . tamsulosin (FLOMAX) 0.4 MG CAPS capsule Take 0.4 mg by mouth.      Scheduled:  . [START ON 11/25/2016] aspirin EC  81 mg Oral Daily  . atorvastatin  40 mg Oral q1800  . nitroGLYCERIN  0.5 inch Topical Q6H    Assessment: 76yo male c/o sharp central CP, notes that he has similar pain intermittently over past year that resolves w/ prune juice and having BM, found to be in Afib at [facility] but concerted to NSR, troponin found to be elevated, to begin heparin.  Goal of Therapy:  Heparin level 0.3-0.7 units/ml Monitor platelets by anticoagulation protocol: Yes   Plan:  Rec'd Lovenox 85mg  this am at  [facility]; will begin heparin gtt 10he after LMWH at 1100 units/hr and monitor heparin levels and CBC.  Wynona Neat, PharmD, BCPS  11/24/2016,7:21 AM

## 2016-11-24 NOTE — H&P (Signed)
Patient ID: Ronald Robinson MRN: [id number], DOB/AGE: 12/30/1940   Admit date: 11/24/2016   Primary Physician: Annetta Maw, MD Primary Cardiologist: Dr Irish Lack (new)  HPI: 76 y/o AA male, works in Magazine features editor, married, non smoker. He has no history of CAD but has been evaluated for chest pain in the past. He has a negative stress echo at [facility] in 2013 that was negative. He has HTN and a strong family history of CAD (his father, 3 brother, and 2 sisters all with a history of CAD). The pt presented to [place] round midnight last night. He presented with a complaint of SSCP "burning". He said his symptoms come on after he eats and the he has had these symptoms "for two years". He insists that nothing else unusual happened last night, he just decided to go to the ER and have it checked out. He denies any exertional symptoms, associated SOB, or radiation to his jaw or arms. In the ED his EKG initially showed AF with CVR. He was unaware of palpitations or tachycardia.  His Troponin was 1.34-2.13. His subsequent EKGs show subtle AS ST elevations and TWI. He is pain free. Plavix 300 mg given at [facility] and the pt was transferred here.    Problem List: Past Medical History:  Diagnosis Date  . Enlarged prostate   . Hypertension     Past Surgical History:  Procedure Laterality Date  . HERNIA REPAIR       Allergies: No Known Allergies   Home Medications Prior to Admission medications   Medication Sig Start Date End Date Taking? Authorizing Provider  amLODipine (NORVASC) 10 MG tablet Take 10 mg by mouth daily.    Historical Provider, MD  aspirin 325 MG tablet Take 325 mg by mouth daily as needed. For pain    Historical Provider, MD  finasteride (PROSCAR) 5 MG tablet Take 5 mg by mouth daily.    Historical Provider, MD  lisinopril (PRINIVIL,ZESTRIL) 10 MG tablet Take 1 tablet (10 mg total) by mouth daily. 10/29/12   Noemi Chapel, MD  metoprolol  succinate (TOPROL-XL) 25 MG 24 hr tablet Take 25 mg by mouth daily.    Historical Provider, MD  tamsulosin (FLOMAX) 0.4 MG CAPS capsule Take 0.4 mg by mouth.    Historical Provider, MD     Family History  Problem Relation Age of Onset  . Coronary artery disease Father   . Coronary artery disease Sister   . Coronary artery disease Brother      Social History   Social History  . Marital status: Married    Spouse name: N/A  . Number of children: N/A  . Years of education: N/A   Occupational History  . Not on file.   Social History Main Topics  . Smoking status: Never Smoker  . Smokeless tobacco: Never Used  . Alcohol use Yes     Comment: rare beer  . Drug use: No  . Sexual activity: Not on file   Other Topics Concern  . Not on file   Social History Narrative  . No narrative on file     Review of Systems: General: negative for chills, fever, night sweats or weight changes.  Cardiovascular: negative for dyspnea on exertion, edema, orthopnea, palpitations, paroxysmal nocturnal dyspnea or shortness of breath HEENT: negative for any visual disturbances, blindness, glaucoma Dermatological: negative for rash Respiratory: negative for cough, hemoptysis, or wheezing Urologic: negative for hematuria or dysuria Abdominal: negative for  nausea, vomiting, diarrhea, bright red blood per rectum, melena, or hematemesis Neurologic: negative for visual changes, syncope, or dizziness Musculoskeletal: negative for back pain, joint pain, or swelling Psych: cooperative and appropriate All other systems reviewed and are otherwise negative except as noted above.  Physical Exam: Blood pressure (!) 149/71, pulse (!) 58, temperature 97.5 F (36.4 C), temperature source Oral, resp. rate 20, height 5\' 7"  (1.702 m), weight 189 lb 12.8 oz (86.1 kg), SpO2 99 %.  General appearance: alert, cooperative, no distress and looks younger than stated age Neck: no carotid bruit and no JVD Lungs: clear to  auscultation bilaterally Heart: regular rate and rhythm Abdomen: soft, non-tender; bowel sounds normal; no masses,  no organomegaly Extremities: extremities normal, atraumatic, no cyanosis or edema Pulses: 2+ and symmetric Skin: Skin color, texture, turgor normal. No rashes or lesions Neurologic: Grossly normal    Labs:   Results for orders placed or performed during the hospital encounter of 11/24/16 (from the past 24 hour(s))  CBC with Differential/Platelet     Status: None   Collection Time: 11/24/16 12:12 AM  Result Value Ref Range   WBC 5.8 4.0 - 10.5 K/uL   RBC 5.75 4.22 - 5.81 MIL/uL   Hemoglobin 15.7 13.0 - 17.0 g/dL   HCT 46.6 39.0 - 52.0 %   MCV 81.0 78.0 - 100.0 fL   MCH 27.3 26.0 - 34.0 pg   MCHC 33.7 30.0 - 36.0 g/dL   RDW 14.5 11.5 - 15.5 %   Platelets 215 150 - 400 K/uL   Neutrophils Relative % 56 %   Neutro Abs 3.3 1.7 - 7.7 K/uL   Lymphocytes Relative 32 %   Lymphs Abs 1.8 0.7 - 4.0 K/uL   Monocytes Relative 10 %   Monocytes Absolute 0.6 0.1 - 1.0 K/uL   Eosinophils Relative 2 %   Eosinophils Absolute 0.1 0.0 - 0.7 K/uL   Basophils Relative 0 %   Basophils Absolute 0.0 0.0 - 0.1 K/uL  Protime-INR     Status: None   Collection Time: 11/24/16 12:12 AM  Result Value Ref Range   Prothrombin Time 13.1 11.4 - 15.2 seconds   INR 0.99   Troponin I     Status: Abnormal   Collection Time: 11/24/16 12:12 AM  Result Value Ref Range   Troponin I 1.34 (HH) <0.03 ng/mL  Comprehensive metabolic panel     Status: Abnormal   Collection Time: 11/24/16 12:12 AM  Result Value Ref Range   Sodium 136 135 - 145 mmol/L   Potassium 3.7 3.5 - 5.1 mmol/L   Chloride 104 101 - 111 mmol/L   CO2 25 22 - 32 mmol/L   Glucose, Bld 145 (H) 65 - 99 mg/dL   BUN 20 6 - 20 mg/dL   Creatinine, Ser 0.65 0.61 - 1.24 mg/dL   Calcium 9.3 8.9 - 10.3 mg/dL   Total Protein 7.0 6.5 - 8.1 g/dL   Albumin 3.8 3.5 - 5.0 g/dL   AST 37 15 - 41 U/L   ALT 29 17 - 63 U/L   Alkaline Phosphatase 68  38 - 126 U/L   Total Bilirubin 0.7 0.3 - 1.2 mg/dL   GFR calc non Af Amer >60 >60 mL/min   GFR calc Af Amer >60 >60 mL/min   Anion gap 7 5 - 15  Ethanol     Status: None   Collection Time: 11/24/16 12:12 AM  Result Value Ref Range   Alcohol, Ethyl (B) <5 <5 mg/dL  Magnesium     Status: None   Collection Time: 11/24/16 12:12 AM  Result Value Ref Range   Magnesium 2.0 1.7 - 2.4 mg/dL  Rapid urine drug screen (hospital performed)     Status: None   Collection Time: 11/24/16  1:45 AM  Result Value Ref Range   Opiates NONE DETECTED NONE DETECTED   Cocaine NONE DETECTED NONE DETECTED   Benzodiazepines NONE DETECTED NONE DETECTED   Amphetamines NONE DETECTED NONE DETECTED   Tetrahydrocannabinol NONE DETECTED NONE DETECTED   Barbiturates NONE DETECTED NONE DETECTED  Troponin I     Status: Abnormal   Collection Time: 11/24/16  4:20 AM  Result Value Ref Range   Troponin I 2.13 (HH) <0.03 ng/mL     Radiology/Studies: Dg Chest 2 View  Result Date: 11/24/2016 CLINICAL DATA:  Sharp midsternal chest pain. EXAM: CHEST  2 VIEW COMPARISON:  01/12/2011 CXR FINDINGS: Top normal size cardiac silhouette. Slight uncoiling of the thoracic aorta without aneurysm. There is aortic atherosclerosis. No pulmonary consolidation, effusion or pneumothorax. No overt pulmonary edema. Degenerative change along the dorsal spine. IMPRESSION: No active cardiopulmonary disease.  Aortic atherosclerosis. Electronically Signed   By: Ashley Royalty M.D.   On: 11/24/2016 00:50    EKG:NSR, SB- ST changes and TWI V2-V5  ASSESSMENT AND PLAN:  Principal Problem:   Non-ST elevation (NSTEMI) myocardial infarction [facility]) Active Problems:   Essential hypertension   Family history of coronary artery disease   PAF (paroxysmal atrial fibrillation) (HCC)   BPH (benign prostatic hyperplasia)   PLAN: Start Heparin, NTG paste, and statin. He is not on a beta blocker secondary to bradycardia. Cath today.   Angelena Form,  PA-C 11/24/2016, 7:39 AM [phone]  I have examined the patient and reviewed assessment and plan and discussed with patient.  Agree with above as stated.  Patient with abnormal ECG.  Suspect significant LAD lesion.  Discussed Risks and benefits of cath.  He is willing to proceed.   Troponin mildly increased.  2+ right radial pulse.  No recent bleeding issues.;  No planed surgey.  SHould be ok for DES if needed.   Larae Grooms

## 2016-11-24 NOTE — ED Notes (Signed)
Pt returned from xray and has converted.  He urinated in xray.  Pt denies any pain or discomfort in chest at this time.

## 2016-11-24 NOTE — Progress Notes (Signed)
ANTICOAGULATION CONSULT NOTE - FOLLOW UP  Pharmacy Consult:  Heparin Indication: chest pain/ACS  No Known Allergies  Patient Measurements: Height: 5\' 7"  (170.2 cm) Weight: 189 lb 12.8 oz (86.1 kg) IBW/kg (Calculated) : 66.1 Heparin Dosing Weight: 85kg  Vital Signs: Temp: 98.1 F (36.7 C) (02/14 1117) Temp Source: Oral (02/14 1117) BP: 123/67 (02/14 1117) Pulse Rate: 57 (02/14 1117)  Labs:  Recent Labs  11/24/16 0012 11/24/16 0420 11/24/16 0726  HGB 15.7  --   --   HCT 46.6  --   --   PLT 215  --   --   LABPROT 13.1  --   --   INR 0.99  --   --   CREATININE 0.65  --   --   TROPONINI 1.34* 2.13* 2.76*    Estimated Creatinine Clearance: 84.9 mL/min (by C-G formula based on SCr of 0.65 mg/dL).    Assessment: 61 YOM presented [facility] with chest pain and was given Lovenox prior to transfer to [facility].  Before the time to start heparin, patient was taken to the cath lab.  Pharmacy received consult to start heparin gtt 10 hours after sheath removal (sheath removed around 1100 per procedural log).  Awaiting CVTS evaluation for CABG.   Goal of Therapy:  Heparin level 0.3-0.7 units/ml Monitor platelets by anticoagulation protocol: Yes    Plan:  - At 2100, start heparin gtt at 1100 units/hr - Check 8 hr heparin level - Daily HL / CBC    Robet Crutchfield D. Mina Marble, PharmD, BCPS Pager:  [phone] 11/24/2016, 11:51 AM

## 2016-11-24 NOTE — ED Notes (Signed)
Pt ambulated to restroom. 

## 2016-11-24 NOTE — ED Notes (Signed)
Attempted to administer pt's medication and he is refusing.  Attempted to educate pt as to why he is getting the medication and he interrupts nurse and is very dismissive.  Attempted to explain the severity of pt's condition and he again interrupts nurse during attempted teaching.  Family at bedside during interaction.  At this time, pt tells nurse to go away and that he does "not need anymore teaching."

## 2016-11-24 NOTE — Progress Notes (Signed)
New Admission Note:   Arrival Method: From [facility] via [facility] Mental Orientation: A&O Telemetry: Box 2w01 Assessment: Completed Skin: Intact IV: L AC      R Hand Pain: denies any pain Tubes: None Safety Measures: Safety Fall Prevention Plan has been discussed  Admission 2 West Orientation: Patient has been orientated to the room, unit and staff.  Family: none present at bedside  Orders to be reviewed and implemented. Will continue to monitor the patient. Call light has been placed within reach and bed alarm has been activated. Tele Box applied CCMD and On call MD notified    Isac Caddy, RN

## 2016-11-24 NOTE — Interval H&P Note (Signed)
Cath Lab Visit (complete for each Cath Lab visit)  Clinical Evaluation Leading to the Procedure:   ACS: Yes.    Non-ACS:    Anginal Classification: CCS III  Anti-ischemic medical therapy: Maximal Therapy (2 or more classes of medications)  Non-Invasive Test Results: No non-invasive testing performed  Prior CABG: No previous CABG      History and Physical Interval Note:  11/24/2016 9:58 AM  Ronald Robinson  has presented today for surgery, with the diagnosis of mi  The various methods of treatment have been discussed with the patient and family. After consideration of risks, benefits and other options for treatment, the patient has consented to  Procedure(s): Left Heart Cath and Coronary Angiography (N/A) as a surgical intervention .  The patient's history has been reviewed, patient examined, no change in status, stable for surgery.  I have reviewed the patient's chart and labs.  Questions were answered to the patient's satisfaction.     Shelva Majestic

## 2016-11-24 NOTE — ED Notes (Signed)
Pt still denies chest pain and denies discomfort

## 2016-11-24 NOTE — ED Notes (Signed)
Pt's family member stepped out to inform nurse that they have changed pt's mind about the medication.

## 2016-11-24 NOTE — ED Notes (Signed)
Pt continues to deny chest pain or discomfort

## 2016-11-24 NOTE — ED Triage Notes (Signed)
Pt reports sharp pain in center of chest since this morning. Pt states he has been having this pain intermittently for over 1 year and usually improves after drinking prune juice and having a BM.

## 2016-11-24 NOTE — ED Provider Notes (Addendum)
[place] DEPT [facility] Provider Note   CSN: [id number] Arrival date & time: 11/24/16  0000     History   Chief Complaint Chief Complaint  Patient presents with  . Chest Pain    HPI [place] Fadeley is a 76 y.o. male past medical history of hypertension presenting today with chest pain. Patient states this has occurred to him intermittently for years. It is normally after he eats. He is a burning substernal chest pain without radiation. He has no shortness of breath, vomiting, or diaphoresis. He normally takes prune juice and has a bowel movement, this resolved his pain. He presents tonight because he finally wants to know what is going on. He denies tinnitus so being any worse than prior. He denies any cardiac history or history of atrial fibrillation.  No further complaints. He currently states off his symptoms have resolved.  10 Systems reviewed and are negative for acute change except as noted in the HPI.   HPI  Past Medical History:  Diagnosis Date  . Enlarged prostate   . Hypertension     Patient Active Problem List   Diagnosis Date Noted  . HYPERTENSION 01/12/2011  . KNEE PAIN, LEFT, ACUTE 01/12/2011  . PES ANSERINUS TENDINITIS OR BURSITIS 01/12/2011  . DYSPNEA 01/12/2011    Past Surgical History:  Procedure Laterality Date  . HERNIA REPAIR         Home Medications    Prior to Admission medications   Medication Sig Start Date End Date Taking? Authorizing Provider  amLODipine (NORVASC) 10 MG tablet Take 10 mg by mouth daily.    Historical Provider, MD  aspirin 325 MG tablet Take 325 mg by mouth daily as needed. For pain    Historical Provider, MD  finasteride (PROSCAR) 5 MG tablet Take 5 mg by mouth daily.    Historical Provider, MD  lisinopril (PRINIVIL,ZESTRIL) 10 MG tablet Take 1 tablet (10 mg total) by mouth daily. 10/29/12   Noemi Chapel, MD  metoprolol succinate (TOPROL-XL) 25 MG 24 hr tablet Take 25 mg by mouth daily.    Historical Provider, MD    tamsulosin (FLOMAX) 0.4 MG CAPS capsule Take 0.4 mg by mouth.    Historical Provider, MD    Family History No family history on file.  Social History Social History  Substance Use Topics  . Smoking status: Never Smoker  . Smokeless tobacco: Never Used  . Alcohol use Yes     Comment: rare beer     Allergies   Patient has no known allergies.   Review of Systems Review of Systems   Physical Exam Updated Vital Signs Wt 184 lb (83.5 kg)   BMI 27.98 kg/m   Physical Exam  Constitutional: He is oriented to person, place, and time. Vital signs are normal. He appears well-developed and well-nourished.  Non-toxic appearance. He does not appear ill. No distress.  HENT:  Head: Normocephalic and atraumatic.  Nose: Nose normal.  Mouth/Throat: Oropharynx is clear and moist. No oropharyngeal exudate.  Eyes: Conjunctivae and EOM are normal. Pupils are equal, round, and reactive to light. No scleral icterus.  Neck: Normal range of motion. Neck supple. No tracheal deviation, no edema, no erythema and normal range of motion present. No thyroid mass and no thyromegaly present.  Cardiovascular: S1 normal, S2 normal, normal heart sounds, intact distal pulses and normal pulses.  Exam reveals no gallop and no friction rub.   No murmur heard. You regularly irregular rhythm with tachycardia  Pulmonary/Chest: Effort normal and breath  sounds normal. No respiratory distress. He has no wheezes. He has no rhonchi. He has no rales.  Abdominal: Soft. Normal appearance and bowel sounds are normal. He exhibits no distension, no ascites and no mass. There is no hepatosplenomegaly. There is no tenderness. There is no rebound, no guarding and no CVA tenderness.  Musculoskeletal: Normal range of motion. He exhibits no edema or tenderness.  Lymphadenopathy:    He has no cervical adenopathy.  Neurological: He is alert and oriented to person, place, and time. He has normal strength. No cranial nerve deficit or  sensory deficit.  Skin: Skin is warm, dry and intact. No petechiae and no rash noted. He is not diaphoretic. No erythema. No pallor.  Nursing note and vitals reviewed.    ED Treatments / Results  Labs (all labs ordered are listed, but only abnormal results are displayed) Labs Reviewed  CBC WITH DIFFERENTIAL/PLATELET  PROTIME-INR  TROPONIN I  COMPREHENSIVE METABOLIC PANEL  RAPID URINE DRUG SCREEN, HOSP PERFORMED  ETHANOL  MAGNESIUM    EKG  EKG Interpretation  Date/Time:  Wednesday November 24 2016 00:08:59 EST Ventricular Rate:  130 PR Interval:    QRS Duration: 85 QT Interval:  280 QTC Calculation: 386 R Axis:   12 Text Interpretation:  Atrial fibrillation Nonspecific T abnormalities, lateral leads a fib is new Confirmed by Glynn Octave [phone]) on 11/24/2016 12:22:18 AM       Radiology No results found.  Procedures Procedures (including critical care time)  Medications Ordered in ED Medications  diltiazem (CARDIZEM) injection 10 mg (not administered)     Initial Impression / Assessment and Plan / ED Course  I have reviewed the triage vital signs and the nursing notes.  Pertinent labs & imaging results that were available during my care of the patient were reviewed by me and considered in my medical decision making (see chart for details).    Patient presents to emergency department for chest pain. His chest pain history sounds like gastric reflux and he states this episode is no different. However he is in A. fib with RVR. We'll obtain laboratory studies and give IV diltiazem for treatment. He remains hemodynamically stable.    1:10 AM patient spontaneously converted to NSR.  EKG reveals some concern for Brugada with biphasic T waves.  In addition, troponin is 1.34.  Plan to call cardiology for admission for NSTEMI.  Aspirin given. He is currently chest pain free.   230am, - I spoke with cardiology who recs for lovenox and plavix for likely cath  tomorrow.  This was ordered.  [facility] called for transport.      CRITICAL CARE Performed by: Everlene Balls   Total critical care time: 50 minutes - NSTEMI  Critical care time was exclusive of separately billable procedures and treating other patients.  Critical care was necessary to treat or prevent imminent or life-threatening deterioration.  Critical care was time spent personally by me on the following activities: development of treatment plan with patient and/or surrogate as well as nursing, discussions with consultants, evaluation of patient's response to treatment, examination of patient, obtaining history from patient or surrogate, ordering and performing treatments and interventions, ordering and review of laboratory studies, ordering and review of radiographic studies, pulse oximetry and re-evaluation of patient's condition.   Final Clinical Impressions(s) / ED Diagnoses   Final diagnoses:  None    New Prescriptions New Prescriptions   No medications on file     Everlene Balls, MD 11/24/16 0111  Everlene Balls, MD 11/24/16 [phone]

## 2016-11-24 NOTE — ED Notes (Signed)
Pt is very receptive at this time.  He states that his mother had a mix up with a blood thinner when she was at another hospital and that is why he is apprehensive.  Spoke with pt about his concerns and discussed the reason for his treatment.  Continued to address pt's concerns and allow him to verbalize his apprehension.  Offered pt information on his new diagnosis and he would like to have some.

## 2016-11-24 NOTE — ED Notes (Signed)
Attempted report to floor.  

## 2016-11-25 LAB — BASIC METABOLIC PANEL
Anion gap: 7 (ref 5–15)
BUN: 17 mg/dL (ref 6–20)
CO2: 24 mmol/L (ref 22–32)
Calcium: 8.8 mg/dL — ABNORMAL LOW (ref 8.9–10.3)
Chloride: 107 mmol/L (ref 101–111)
Creatinine, Ser: 0.71 mg/dL (ref 0.61–1.24)
GFR calc Af Amer: >60 mL/min (ref >60)
GFR calc non Af Amer: >60 mL/min (ref >60)
Glucose, Bld: 87 mg/dL (ref 65–99)
Potassium: 3.8 mmol/L (ref 3.5–5.1)
Sodium: 138 mmol/L (ref 135–145)

## 2016-11-25 LAB — CBC
HCT: 40.7 % (ref 39.0–52.0)
HEMOGLOBIN: 13.2 g/dL (ref 13.0–17.0)
MCH: 26.5 pg (ref 26.0–34.0)
MCHC: 32.4 g/dL (ref 30.0–36.0)
MCV: 81.6 fL (ref 78.0–100.0)
Platelets: 197 10*3/uL (ref 150–400)
RBC: 4.99 MIL/uL (ref 4.22–5.81)
RDW: 14.3 % (ref 11.5–15.5)
WBC: 5 10*3/uL (ref 4.0–10.5)

## 2016-11-25 LAB — TROPONIN I: TROPONIN I: 4.68 ng/mL — AB (ref <0.03)

## 2016-11-25 LAB — LIPID PANEL
CHOLESTEROL: 146 mg/dL (ref 0–200)
HDL: 49 mg/dL (ref >40)
LDL Cholesterol: 90 mg/dL (ref 0–99)
TRIGLYCERIDES: 34 mg/dL (ref <150)
Total CHOL/HDL Ratio: 3 RATIO
VLDL: 7 mg/dL (ref 0–40)

## 2016-11-25 LAB — HEPARIN LEVEL (UNFRACTIONATED)
HEPARIN UNFRACTIONATED: 0.59 [IU]/mL (ref 0.30–0.70)
Heparin Unfractionated: 0.26 IU/mL — ABNORMAL LOW (ref 0.30–0.70)

## 2016-11-25 NOTE — Progress Notes (Addendum)
ANTICOAGULATION CONSULT NOTE - FOLLOW UP  Pharmacy Consult:  Heparin Indication: chest pain/ACS  No Known Allergies  Patient Measurements: Height: 5\' 7"  (170.2 cm) Weight: 189 lb 12.8 oz (86.1 kg) IBW/kg (Calculated) : 66.1 Heparin Dosing Weight: 85kg  Vital Signs: Temp: 98.8 F (37.1 C) (02/15 1003) Temp Source: Oral (02/15 1003) BP: 128/71 (02/15 1003) Pulse Rate: 55 (02/15 0524)  Labs:  Recent Labs  11/24/16 0012  11/24/16 0726 11/24/16 1248 11/25/16 0217 11/25/16 0754 11/25/16 1002  HGB 15.7  --   --   --  13.2  --   --   HCT 46.6  --   --   --  40.7  --   --   PLT 215  --   --   --  197  --   --   LABPROT 13.1  --   --   --   --   --   --   INR 0.99  --   --   --   --   --   --   HEPARINUNFRC  --   --   --   --  0.26*  --  0.59  CREATININE 0.65  --   --   --  0.71  --   --   TROPONINI 1.34*  < > 2.76* 4.78*  --  4.68*  --   < > = values in this interval not displayed.  Estimated Creatinine Clearance: 84.9 mL/min (by C-G formula based on SCr of 0.71 mg/dL).    Assessment: 43 YOM presented to [facility] with chest pain and was given Lovenox prior to transfer to [facility].  Before the time to start heparin, patient was taken to the cath lab.  Pharmacy received consult post-cath to start heparin while awaiting CVTS evaluation for CABG.  Heparin level therapeutic; no bleeding reported.   Goal of Therapy:  Heparin level 0.3-0.7 units/ml Monitor platelets by anticoagulation protocol: Yes    Plan:  - Continue heparin gtt at 1200 units/hr - Daily HL / CBC - Consider discontinuing NTG ointment since patient was started on Imdur    Babbie Dondlinger D. Mina Marble, PharmD, BCPS Pager:  [phone] 11/25/2016, 11:21 AM

## 2016-11-25 NOTE — Progress Notes (Signed)
Brief episode of SVT this AM then back to SR SB. No change in meds.

## 2016-11-25 NOTE — Progress Notes (Signed)
ANTICOAGULATION CONSULT NOTE - Follow Up Consult  Pharmacy Consult for heparin Indication: CAD awaiting surgical consult  Labs:  Recent Labs  11/24/16 0012 11/24/16 0420 11/24/16 0726 11/24/16 1248 11/25/16 0217  HGB 15.7  --   --   --  13.2  HCT 46.6  --   --   --  40.7  PLT 215  --   --   --  197  LABPROT 13.1  --   --   --   --   INR 0.99  --   --   --   --   HEPARINUNFRC  --   --   --   --  0.26*  CREATININE 0.65  --   --   --  0.71  TROPONINI 1.34* 2.13* 2.76* 4.78*  --     Assessment: 75yo male subtherapeutic on heparin after resumed post-cath.  Goal of Therapy:  Heparin level 0.3-0.7 units/ml   Plan:  Will increase heparin gtt slightly to 1200 units/hr and check level in 6hr.  Wynona Neat, PharmD, BCPS  11/25/2016,3:42 AM

## 2016-11-25 NOTE — Progress Notes (Signed)
Ronald Kicks, NP notified that patient had some runs of SVT,patient resting quietly in bed, denies chest pain and was asymptomatic, patient's wife at bedside, call bell within reach, new orders received , will continue to monitor

## 2016-11-25 NOTE — Progress Notes (Signed)
Progress Note  Patient Name: Ronald Robinson Date of Encounter: 11/25/2016  Primary Cardiologist: Dr. Cherlynn Kaiser   Subjective   No chest burning , maintaining SR to SB no dizziness, no SOB   Inpatient Medications    Scheduled Meds: . aspirin EC  81 mg Oral Daily  . atorvastatin  80 mg Oral q1800  . isosorbide mononitrate  30 mg Oral Daily  . nitroGLYCERIN  0.5 inch Topical Q6H  . ranolazine  500 mg Oral BID  . sodium chloride flush  3 mL Intravenous Q12H   Continuous Infusions: . sodium chloride Stopped (11/25/16 0631)  . heparin 1,200 Units/hr (11/25/16 0417)   PRN Meds: sodium chloride, acetaminophen, ALPRAZolam, diazepam, nitroGLYCERIN, ondansetron (ZOFRAN) IV, sodium chloride flush, zolpidem   Vital Signs    Vitals:   11/24/16 1300 11/24/16 1320 11/24/16 1947 11/25/16 0524  BP: 117/65 116/71 114/66 (!) 112/56  Pulse: (!) 54 (!) 55 64 (!) 55  Resp:   18 18  Temp:   98.1 F (36.7 C) 98.6 F (37 C)  TempSrc:   Oral Oral  SpO2: 98% 99% 99% 97%  Weight:      Height:       No intake or output data in the 24 hours ending 11/25/16 0658 Filed Weights   11/24/16 0004 11/24/16 0604  Weight: 184 lb (83.5 kg) 189 lb 12.8 oz (86.1 kg)    Telemetry    SR to SB to 48 at night - Personally Reviewed  ECG    SB at 52 with deep T wave inversion in V2-V4 increased from last night - Personally Reviewed  Physical Exam   GEN: No acute distress.   Neck: No JVD Cardiac: RRR, no murmurs, rubs, or gallops.  Respiratory: Clear to auscultation bilaterally. GI: Soft, nontender, non-distended  MS: No edema; No deformity. Radial cath site without hematoma Neuro:  Nonfocal  Psych: Normal affect   Labs    Chemistry Recent Labs Lab 11/24/16 0012 11/25/16 0217  NA 136 138  K 3.7 3.8  CL 104 107  CO2 25 24  GLUCOSE 145* 87  BUN 20 17  CREATININE 0.65 0.71  CALCIUM 9.3 8.8*  PROT 7.0  --   ALBUMIN 3.8  --   AST 37  --   ALT 29  --   ALKPHOS 68  --   BILITOT 0.7   --   GFRNONAA >60 >60  GFRAA >60 >60  ANIONGAP 7 7     Hematology Recent Labs Lab 11/24/16 0012 11/25/16 0217  WBC 5.8 5.0  RBC 5.75 4.99  HGB 15.7 13.2  HCT 46.6 40.7  MCV 81.0 81.6  MCH 27.3 26.5  MCHC 33.7 32.4  RDW 14.5 14.3  PLT 215 197    Cardiac Enzymes Recent Labs Lab 11/24/16 0012 11/24/16 0420 11/24/16 0726 11/24/16 1248  TROPONINI 1.34* 2.13* 2.76* 4.78*   No results for input(s): TROPIPOC in the last 168 hours.   BNPNo results for input(s): BNP, PROBNP in the last 168 hours.   DDimer No results for input(s): DDIMER in the last 168 hours.   Radiology    Dg Chest 2 View  Result Date: 11/24/2016 CLINICAL DATA:  Sharp midsternal chest pain. EXAM: CHEST  2 VIEW COMPARISON:  01/12/2011 CXR FINDINGS: Top normal size cardiac silhouette. Slight uncoiling of the thoracic aorta without aneurysm. There is aortic atherosclerosis. No pulmonary consolidation, effusion or pneumothorax. No overt pulmonary edema. Degenerative change along the dorsal spine. IMPRESSION: No active cardiopulmonary disease.  Aortic atherosclerosis. Electronically Signed   By: Ashley Royalty M.D.   On: 11/24/2016 00:50    Cardiac Studies   11/24/16 Procedures   Left Heart Cath and Coronary Angiography  Conclusion     Prox RCA to Mid RCA lesion, 70 %stenosed.  Post Atrio lesion, 90 %stenosed.  Ramus lesion, 80 %stenosed.  Prox LAD to Mid LAD lesion, 95 %stenosed.  Dist LAD-2 lesion, 95 %stenosed.  Dist LAD-1 lesion, 90 %stenosed.  Ost 1st Mrg lesion, 90 %stenosed.  Mid Cx lesion, 80 %stenosed.  Dist Cx lesion, 99 %stenosed.  Dist RCA lesion, 40 %stenosed.   Relatively preserved global LV contractility with an catenoid  appearing left ventricle.  LVEDP is normal.  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   RECOMMENDATION: Surgical consultation will be obtained.  However, the patient may have poor targets,  particularly in the LAD for revascularization.  The patient apparently received a dose of Plavix 300 mg at [facility] before transferring to [facility] hospital; this will be discontinued for surgical evaluation.  Aggressive medical therapy will be implemented with beta blocker, nitrates, continuation of amlodipine and lisinopril, as well as the addition of ranolazine.  High potency statin therapy will be implemented.  If the patient is not felt to be an operative candidate, would recommend institution of dual platelet therapy.      Patient Profile     76 y.o. male with no history of CAD but has been evaluated for chest pain in the past. He has a negative stress echo at [facility] in 2013.  He has HTN and a strong family history of CAD (his father, 3 brother, and 2 sisters all with a history of CAD). The pt presented to [place] round midnight 11/23/16. He presented with a complaint of SSCP "burning". He said his symptoms come on after he eats and the he has had these symptoms "for two years". He insists that nothing else unusual happened last night, he just decided to go to the ER and have it checked out. He denies any exertional symptoms, associated SOB, or radiation to his jaw or arms. In the ED his EKG initially showed AF with CVR. He was unaware of palpitations or tachycardia.  His Troponin was 1.34-2.13. His subsequent EKGs show subtle AS ST elevations and TWI. He is pain free. Plavix 300 mg given at [facility] and the pt was transferred here.   Assessment & Plan    NSTEMI -  cardiac cath yesterday, with severe diffuse multivessel CAD with LAD, ramus, LCX and RCA.  Surgical consult obtained. Aggressive medical therapy. Pt did receive 300 mg of plavix yesterday.  Will recheck troponin today.  Continue IV heparin.  Will check with surgery today, pt wants his wife here for consult.  CAD see above.  PAF - SR currently  HTN- essential.    HLD- LDL 90 now on high dose  statin.   Signed, Cecilie Kicks, NP  11/25/2016, 6:58 AM    I have examined the patient and reviewed assessment and plan and discussed with patient.  Agree with above as stated.  Patient with no further chest discomfort.  I personally reviewed the films.  Diffusely diseased LAD.  Awaiting CVTS opinion on revascularization.  Regardless, would continue IV heparin until tomorrow morning for a total of 48 hours.  Then decided on medical therapy going forward.   Larae Grooms

## 2016-11-25 NOTE — Care Management Note (Signed)
Case Management Note Marvetta Gibbons RN, BSN Unit 2W-Case Manager [phone]  Patient Details  Name: Ronald Robinson MRN: [id number] Date of Birth: 28-Sep-1941  Subjective/Objective:  Pt admitted with NSTEMI, s/p with MVD, ?CABG awaiting consult                  Action/Plan: PTA Pt lived at home with wife- CM will follow for d/c needs post op CABG- would anticipate return home with wife.   Expected Discharge Date:                  Expected Discharge Plan:  Home/Self Care  In-House Referral:     Discharge planning Services  CM Consult  Post Acute Care Choice:    Choice offered to:     DME Arranged:    DME Agency:     HH Arranged:    HH Agency:     Status of Service:  In process, will continue to follow  If discussed at Long Length of Stay Meetings, dates discussed:    Additional Comments:  Dawayne Patricia, RN 11/25/2016, 12:02 PM

## 2016-11-26 ENCOUNTER — Telehealth: Payer: Self-pay | Admitting: Cardiology

## 2016-11-26 ENCOUNTER — Encounter (HOSPITAL_COMMUNITY): Payer: Self-pay | Admitting: Cardiology

## 2016-11-26 DIAGNOSIS — Z9889 Other specified postprocedural states: Secondary | ICD-10-CM

## 2016-11-26 DIAGNOSIS — I25110 Atherosclerotic heart disease of native coronary artery with unstable angina pectoris: Secondary | ICD-10-CM

## 2016-11-26 DIAGNOSIS — I2510 Atherosclerotic heart disease of native coronary artery without angina pectoris: Secondary | ICD-10-CM

## 2016-11-26 DIAGNOSIS — E782 Mixed hyperlipidemia: Secondary | ICD-10-CM

## 2016-11-26 DIAGNOSIS — I2583 Coronary atherosclerosis due to lipid rich plaque: Secondary | ICD-10-CM

## 2016-11-26 HISTORY — DX: Atherosclerotic heart disease of native coronary artery without angina pectoris: I25.10

## 2016-11-26 LAB — CBC
HCT: 40.9 % (ref 39.0–52.0)
Hemoglobin: 13.5 g/dL (ref 13.0–17.0)
MCH: 26.9 pg (ref 26.0–34.0)
MCHC: 33 g/dL (ref 30.0–36.0)
MCV: 81.6 fL (ref 78.0–100.0)
PLATELETS: 175 10*3/uL (ref 150–400)
RBC: 5.01 MIL/uL (ref 4.22–5.81)
RDW: 14.6 % (ref 11.5–15.5)
WBC: 5.2 10*3/uL (ref 4.0–10.5)

## 2016-11-26 LAB — HEPARIN LEVEL (UNFRACTIONATED): Heparin Unfractionated: 0.92 IU/mL — ABNORMAL HIGH (ref 0.30–0.70)

## 2016-11-26 MED ORDER — CLOPIDOGREL BISULFATE 75 MG PO TABS: 75.0000 mg | ORAL_TABLET | Freq: Every day | ORAL | 11 refills | Status: DC

## 2016-11-26 MED ORDER — CLOPIDOGREL BISULFATE 75 MG PO TABS
75.0000 mg | ORAL_TABLET | Freq: Every day | ORAL | Status: DC
  Administered 2016-11-26: 75 mg via ORAL
  Filled 2016-11-26: qty 1

## 2016-11-26 MED ORDER — ISOSORBIDE MONONITRATE ER 30 MG PO TB24: 30.0000 mg | ORAL_TABLET | Freq: Every day | ORAL | 3 refills | Status: DC

## 2016-11-26 MED ORDER — ATORVASTATIN CALCIUM 80 MG PO TABS: 80.0000 mg | ORAL_TABLET | Freq: Every day | ORAL | 6 refills | Status: DC

## 2016-11-26 MED ORDER — ACETAMINOPHEN 325 MG PO TABS: 650.0000 mg | ORAL_TABLET | ORAL | 2 refills | Status: AC | PRN

## 2016-11-26 MED ORDER — NITROGLYCERIN 0.4 MG SL SUBL: 0.4000 mg | SUBLINGUAL_TABLET | SUBLINGUAL | 12 refills | Status: DC | PRN

## 2016-11-26 NOTE — Progress Notes (Signed)
ANTICOAGULATION CONSULT NOTE - Follow Up Consult  Pharmacy Consult for Heparin  Indication: CAD awaiting CVTS consult  No Known Allergies  Patient Measurements: Height: 5\' 7"  (170.2 cm) Weight: 189 lb 12.8 oz (86.1 kg) IBW/kg (Calculated) : 66.1  Vital Signs: Temp: 98.6 F (37 C) (02/15 2033) Temp Source: Oral (02/15 2033) BP: 122/59 (02/15 2033) Pulse Rate: 49 (02/15 2033)  Labs:  Recent Labs  11/24/16 0012  11/24/16 0726 11/24/16 1248 11/25/16 0217 11/25/16 0754 11/25/16 1002 11/26/16 0207  HGB 15.7  --   --   --  13.2  --   --  13.5  HCT 46.6  --   --   --  40.7  --   --  40.9  PLT 215  --   --   --  197  --   --  175  LABPROT 13.1  --   --   --   --   --   --   --   INR 0.99  --   --   --   --   --   --   --   HEPARINUNFRC  --   --   --   --  0.26*  --  0.59 0.92*  CREATININE 0.65  --   --   --  0.71  --   --   --   TROPONINI 1.34*  < > 2.76* 4.78*  --  4.68*  --   --   < > = values in this interval not displayed.  Estimated Creatinine Clearance: 84.9 mL/min (by C-G formula based on SCr of 0.71 mg/dL).  Assessment: Heparin while awaiting CVTS consult, heparin level elevated this AM, minimal cath site bleeding that has resolved  Goal of Therapy:  Heparin level 0.3-0.7 units/ml Monitor platelets by anticoagulation protocol: Yes   Plan:  -Dec heparin to 1050 units/hr -1200 HL  Narda Bonds 11/26/2016,3:20 AM

## 2016-11-26 NOTE — Discharge Instructions (Signed)
Call [facility] at [phone] if any bleeding, swelling or drainage at cath site.  May shower, no tub baths for 48 hours for groin sticks. No lifting over 5 pounds for 3 days.  No Driving for 3 days  Heart Attack A heart attack (myocardial infarction, MI) causes damage to your heart that cannot be fixed. A heart attack can happen when a heart (coronary) artery becomes blocked or narrowed. This cuts off the blood supply and oxygen to your heart. When one or more of your coronary arteries becomes blocked, that area of your heart begins to die. This causes the pain you feel during a heart attack. Heart attack pain can also occur in one part of the body but be felt in another part of the body (referred pain). You may feel referred heart attack pain in your left arm, neck, or jaw. Pain may even be felt in the right arm. What are the causes? Many conditions can cause a heart attack. These include:  Atherosclerosis. This is when a fatty substance (plaque) gradually builds up in the arteries. This buildup can block or reduce the blood supply to one or more of the heart arteries.  A blood clot. A blood clot can develop suddenly when plaque breaks up (ruptures) within a heart artery. A blood clot can block the heart artery, which prevents blood flow to the heart.  Severe tightening (spasm) of the heart artery. This cuts off blood flow through the artery. What increases the risk?   People at risk for heart attack usually have one or more of the following risk factors:  High blood pressure (hypertension).  High cholesterol.  Smoking.  Being male.  Being overweight or obese.  Older aged.  A family history of heart disease.  Lack of exercise.  Diabetes.  Stress.  Drinking too much alcohol.  Using illegal street drugs, such as cocaine and methamphetamines. What are the signs or symptoms? Heart attack symptoms can vary from person to person. Symptoms depend on factors  like gender and age.  In both men and women, heart attack symptoms can include the following:  Chest pain. This may feel like crushing, squeezing, or a feeling of pressure.  Shortness of breath.  Heartburn or indigestion with or without vomiting, shortness of breath, or sweating.  Sudden cold sweats.  Sudden light-headedness.  Upper back pain.  Women can have unique heart attack symptoms, such as:  Unexplained feelings of nervousness or anxiety.  Discomfort between the shoulder blades or upper back.  Tingling in the hands and arms.  Older people (of both genders) can have subtle heart attack symptoms, such as:  Sweating.  Shortness of breath.  General tiredness or not feeling well. How is this diagnosed? Diagnosing a heart attack involves several tests. They include:  An assessment of your vital signs. This includes checking your:  Heart rhythm.  Blood pressure.  Breathing rate.  Oxygen level.  An ECG (electrocardiogram) to measure the electrical activity of your heart.  Blood tests called cardiac markers. In these tests, blood is drawn at scheduled times to check for the specific proteins or enzymes released by damaged heart muscle.  A chest X-ray.  An echocardiogram to evaluate heart motion and blood flow.  Coronary angiography to look at the heart arteries. How is this treated? Treatment for a heart attack may include:  Medicine that breaks up or dissolves blood clots in the heart artery.  Angioplasty.  Cardiac stent placement.  Intra-aortic balloon pump  therapy (IABP).  Open heart surgery (coronary artery bypass graft, CABG). Follow these instructions at home:  Take medicines only as directed by your health care provider. You may need to take medicine after a heart attack to:  Keep your blood from clotting too easily.  Control your blood pressure.  Lower your cholesterol.  Control abnormal heart rhythms.  Do not take the following  medicines unless your health care provider approves:  Nonsteroidal anti-inflammatory drugs (NSAIDs), such as ibuprofen, naproxen, or celecoxib.  Vitamin supplements that contain vitamin A, vitamin E, or both.  Hormone replacement therapy that contains estrogen with or without progestin.  Make lifestyle changes as directed by your health care provider. These may include:  Quitting smoking, if you smoke.  Getting regular exercise. Ask your health care provider to suggest some activities that are safe for you.  Eating a heart-healthy diet. A registered dietitian can help you learn healthy eating options.  Maintaining a healthy weight.  Managing diabetes, if necessary.  Reducing stress.  Limiting how much alcohol you drink. Get help right away if:  You have sudden, unexplained chest discomfort.  You have sudden, unexplained discomfort in your arms, back, neck, or jaw.  You have shortness of breath at any time.  You suddenly start to sweat or your skin gets clammy.  You feel nauseous or vomit.  You suddenly feel light-headed or dizzy.  Your heart begins to beat fast or feels like it is skipping beats. These symptoms may represent a serious problem that is an emergency. Do not wait to see if the symptoms will go away. Get medical help right away. Call your local emergency services (911 in the [place] Do not drive yourself to the hospital.  This information is not intended to replace advice given to you by your health care provider. Make sure you discuss any questions you have with your health care provider. Document Released: 09/27/2005 Document Revised: 03/04/2016 Document Reviewed: 11/30/2013 Elsevier Interactive Patient Education  2017 [employer] Inc[employer]   Low-Sodium Eating Plan Sodium raises blood pressure and causes water to be held in the body. Getting less sodium from food will help lower your blood pressure, reduce any swelling, and protect your heart, liver, and kidneys. We  get sodium by adding salt (sodium chloride) to food. Most of our sodium comes from canned, boxed, and frozen foods. Restaurant foods, fast foods, and pizza are also very high in sodium. Even if you take medicine to lower your blood pressure or to reduce fluid in your body, getting less sodium from your food is important. What is my plan? Most people should limit their sodium intake to 2,300 mg a day. Your health care provider recommends that you limit your sodium intake to __________ a day. What do I need to know about this eating plan? For the low-sodium eating plan, you will follow these general guidelines: Choose foods with a % Daily Value for sodium of less than 5% (as listed on the food label). Use salt-free seasonings or herbs instead of table salt or sea salt. Check with your health care provider or pharmacist before using salt substitutes. Eat fresh foods. Eat more vegetables and fruits. Limit canned vegetables. If you do use them, rinse them well to decrease the sodium. Limit cheese to 1 oz (28 g) per day. Eat lower-sodium products, often labeled as "lower sodium" or "no salt added." Avoid foods that contain monosodium glutamate (MSG). MSG is sometimes added to [place] food and some canned foods. Check food labels (  Nutrition Facts labels) on foods to learn how much sodium is in one serving. Eat more home-cooked food and less restaurant, buffet, and fast food. When eating at a restaurant, ask that your food be prepared with less salt, or no salt if possible. How do I read food labels for sodium information? The Nutrition Facts label lists the amount of sodium in one serving of the food. If you eat more than one serving, you must multiply the listed amount of sodium by the number of servings. Food labels may also identify foods as: Sodium free--Less than 5 mg in a serving. Very low sodium--35 mg or less in a serving. Low sodium--140 mg or less in a serving. Light in sodium--50% less  sodium in a serving. For example, if a food that usually has 300 mg of sodium is changed to become light in sodium, it will have 150 mg of sodium. Reduced sodium--25% less sodium in a serving. For example, if a food that usually has 400 mg of sodium is changed to reduced sodium, it will have 300 mg of sodium. What foods can I eat? Grains  Low-sodium cereals, including oats, puffed wheat and rice, and shredded wheat cereals. Low-sodium crackers. Unsalted rice and pasta. Lower-sodium bread. Vegetables  Frozen or fresh vegetables. Low-sodium or reduced-sodium canned vegetables. Low-sodium or reduced-sodium tomato sauce and paste. Low-sodium or reduced-sodium tomato and vegetable juices. Fruits  Fresh, frozen, and canned fruit. Fruit juice. Meat and Other Protein Products  Low-sodium canned tuna and salmon. Fresh or frozen meat, poultry, seafood, and fish. Lamb. Unsalted nuts. Dried beans, peas, and lentils without added salt. Unsalted canned beans. Homemade soups without salt. Eggs. Dairy  Milk. Soy milk. Ricotta cheese. Low-sodium or reduced-sodium cheeses. Yogurt. Condiments  Fresh and dried herbs and spices. Salt-free seasonings. Onion and garlic powders. Low-sodium varieties of mustard and ketchup. Fresh or refrigerated horseradish. Lemon juice. Fats and Oils  Reduced-sodium salad dressings. Unsalted butter. Other  Unsalted popcorn and pretzels. The items listed above may not be a complete list of recommended foods or beverages. Contact your dietitian for more options.  What foods are not recommended? Grains  Instant hot cereals. Bread stuffing, pancake, and biscuit mixes. Croutons. Seasoned rice or pasta mixes. Noodle soup cups. Boxed or frozen macaroni and cheese. Self-rising flour. Regular salted crackers. Vegetables  Regular canned vegetables. Regular canned tomato sauce and paste. Regular tomato and vegetable juices. Frozen vegetables in sauces. Salted [place] fries. Olives. Angie Fava.  Relishes. Sauerkraut. Salsa. Meat and Other Protein Products  Salted, canned, smoked, spiced, or pickled meats, seafood, or fish. Bacon, ham, sausage, hot dogs, corned beef, chipped beef, and packaged luncheon meats. Salt pork. Jerky. Pickled herring. Anchovies, regular canned tuna, and sardines. Salted nuts. Dairy  Processed cheese and cheese spreads. Cheese curds. Blue cheese and cottage cheese. Buttermilk. Condiments  Onion and garlic salt, seasoned salt, table salt, and sea salt. Canned and packaged gravies. Worcestershire sauce. Tartar sauce. Barbecue sauce. Teriyaki sauce. Soy sauce, including reduced sodium. Steak sauce. Fish sauce. Oyster sauce. Cocktail sauce. Horseradish that you find on the shelf. Regular ketchup and mustard. Meat flavorings and tenderizers. Bouillon cubes. Hot sauce. Tabasco sauce. Marinades. Taco seasonings. Relishes. Fats and Oils  Regular salad dressings. Salted butter. Margarine. Ghee. Bacon fat. Other  Potato and tortilla chips. Corn chips and puffs. Salted popcorn and pretzels. Canned or dried soups. Pizza. Frozen entrees and pot pies. The items listed above may not be a complete list of foods and beverages to avoid. Contact your  dietitian for more information.  This information is not intended to replace advice given to you by your health care provider. Make sure you discuss any questions you have with your health care provider. Document Released: 03/19/2002 Document Revised: 03/04/2016 Document Reviewed: 08/01/2013 Elsevier Interactive Patient Education  2017 [employer].

## 2016-11-26 NOTE — Discharge Summary (Signed)
Discharge Summary    Patient ID: Ronald Robinson,  MRN: [id number], DOB/AGE: March 12, 1941 76 y.o.  Admit date: 11/24/2016 Discharge date: 11/26/2016  Primary Care Provider: Annetta Maw Primary Cardiologist: Dr. Lendell Caprice  Discharge Diagnoses    Principal Problem:   Non-ST elevation (NSTEMI) myocardial infarction [facility]) Active Problems:   Essential hypertension   Family history of coronary artery disease   PAF (paroxysmal atrial fibrillation) (HCC)   BPH (benign prostatic hyperplasia)   CAD in native artery    S/P cardiac cath: Severe 3 vessel disease, not a candidate for CABG. Medical treatment  Allergies No Known Allergies   History of Present Illness     76 y.o.malewith no history of CAD but has been evaluated for chest pain in the past. He has a negative stress echo at [facility] in 2013. He has HTN and a strong family history of CAD (his father, 3 brother, and 2 sisters all with a history of CAD). The pt presented to [place] round midnight 11/23/16. He presented with a complaint of SSCP "burning". He said his symptoms come on after he eats and the he has had these symptoms "for two years". He insists that nothing else unusual happened last night, he just decided to go to the ER and have it checked out. He denies any exertional symptoms, associated SOB, or radiation to his jaw or arms. In the ED his EKG initially showed AF with CVR. He was unaware of palpitations or tachycardia. His Troponin was 1.34-2.13. His subsequent EKGs show subtle AS ST elevations and TWI.  pt was transferred to [facility] for [facility]ology admission.  Hospital Course     Consultants: Cardiothoracic Surgery  Patient had a cardiac cath using the right radial approach while in the ED which showed diffuse multivessel CAD involving the LAD, ramus intermediate, left circumflex, and RCA.  Pt not amenable to stent placement. CTS consulted and saw patient, they do not feel like CABG is the best  treatment, his LAD system not graft able. The LCX and RCA systems are diffusely diseased and it is not possible to graft beyond the disease. Dr. Cyndia Bent recommends medical treatment.  Dr. Irish Lack has seen patient today and deemed ready for discharge home. All follow-up appointments have been scheduled.   Plan is to stop heparin and Ranexa. Restart Pllavix, patient able to ambulate without angina per cardiac rehab.  Antianginal therapy Imdur, can add back Ranexa if needed. Pepcid initiated for home. _____________  Discharge Vitals Blood pressure 114/62, pulse 60, temperature 98.2 F (36.8 C), temperature source Oral, resp. rate 18, height 5\' 7"  (1.702 m), weight 189 lb 12.8 oz (86.1 kg), SpO2 97 %.  Filed Weights   11/24/16 0004 11/24/16 0604  Weight: 184 lb (83.5 kg) 189 lb 12.8 oz (86.1 kg)    Labs & Radiologic Studies     CBC  Recent Labs  11/24/16 0012 11/25/16 0217 11/26/16 0207  WBC 5.8 5.0 5.2  NEUTROABS 3.3  --   --   HGB 15.7 13.2 13.5  HCT 46.6 40.7 40.9  MCV 81.0 81.6 81.6  PLT 215 197 0000000   Basic Metabolic Panel  Recent Labs  11/24/16 0012 11/25/16 0217  NA 136 138  K 3.7 3.8  CL 104 107  CO2 25 24  GLUCOSE 145* 87  BUN 20 17  CREATININE 0.65 0.71  CALCIUM 9.3 8.8*  MG 2.0  --    Liver Function Tests  Recent Labs  11/24/16 0012  AST 37  ALT 29  ALKPHOS 68  BILITOT 0.7  PROT 7.0  ALBUMIN 3.8   No results for input(s): LIPASE, AMYLASE in the last 72 hours. Cardiac Enzymes  Recent Labs  11/24/16 0726 11/24/16 1248 11/25/16 0754  TROPONINI 2.76* 4.78* 4.68*   BNP Invalid input(s): POCBNP D-Dimer No results for input(s): DDIMER in the last 72 hours. Hemoglobin A1C No results for input(s): HGBA1C in the last 72 hours. Fasting Lipid Panel  Recent Labs  11/25/16 0217  CHOL 146  HDL 49  LDLCALC 90  TRIG 34  CHOLHDL 3.0   Thyroid Function Tests  Recent Labs  11/24/16 0726  TSH 1.911    Dg Chest 2 View  Result Date:  11/24/2016 CLINICAL DATA:  Sharp midsternal chest pain. EXAM: CHEST  2 VIEW COMPARISON:  01/12/2011 CXR FINDINGS: Top normal size cardiac silhouette. Slight uncoiling of the thoracic aorta without aneurysm. There is aortic atherosclerosis. No pulmonary consolidation, effusion or pneumothorax. No overt pulmonary edema. Degenerative change along the dorsal spine. IMPRESSION: No active cardiopulmonary disease.  Aortic atherosclerosis. Electronically Signed   By: Ashley Royalty M.D.   On: 11/24/2016 00:50   Diagnostic Studies/Procedures    Left Heart Cath and Coronary Angiography 11/24/2016   Prox RCA to Mid RCA lesion, 70 %stenosed.  Post Atrio lesion, 90 %stenosed.  Ramus lesion, 80 %stenosed.  Prox LAD to Mid LAD lesion, 95 %stenosed.  Dist LAD-2 lesion, 95 %stenosed.  Dist LAD-1 lesion, 90 %stenosed.  Ost 1st Mrg lesion, 90 %stenosed.  Mid Cx lesion, 80 %stenosed.  Dist Cx lesion, 99 %stenosed.  Dist RCA lesion, 40 %stenosed.  The left ventricle has a catenoid appearance. Ejection fraction is 55-60%. LVEDP 13 mm Hg.   Relatively preserved global LV contractility with an catenoid  appearing left ventricle.  LVEDP is normal.  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   RECOMMENDATION: Surgical consultation will be obtained.  However, the patient may have poor targets, particularly in the LAD for revascularization.  The patient apparently received a dose of Plavix 300 mg at [facility] before transferring to [facility] hospital; this will be discontinued for surgical evaluation.  Aggressive medical therapy will be implemented with beta blocker, nitrates, continuation of amlodipine and lisinopril, as well as the addition of ranolazine.  High potency statin therapy will be implemented.  If the patient is not felt to be an operative candidate, would recommend institution of dual platelet therapy. _____________  Disposition   Pt  is being discharged home today in good condition.  Follow-up Plans & Appointments    Follow-up Information    [facility]. Go on 11/30/2016.   Why:  2:00 am appoinment on February 20 with Ellen Henri, PA-C. Please arrive at 1:45 pm to check in. Contact information: [place] [phone]         Discharge Instructions    Amb Referral to Cardiac Rehabilitation    Complete by:  As directed    Diagnosis:  NSTEMI     Discharge Medications    Aspirin prescribed at discharge?  Yes High Intensity Statin Prescribed? (Lipitor 40-80mg  or Crestor 20-40mg ): Yes Beta Blocker Prescribed? No: due to bradycardia For EF 45% or less, Was ACEI/ARB Prescribed? No: EF 55-50% ADP Receptor Inhibitor Prescribed? (i.e. Plavix etc.-Includes Medically Managed Patients): Yes For EF <45%, Aldosterone Inhibitor Prescribed? No: EF 55-60% Was EF assessed during THIS hospitalization? Yes  Was Cardiac Rehab II ordered? (Included Medically managed Patients): Yes    Medication List    STOP taking these medications   amLODipine 10 MG tablet Commonly known as:  NORVASC     TAKE these medications   acetaminophen 325 MG tablet Commonly known as:  TYLENOL Take 2 tablets (650 mg total) by mouth every 4 (four) hours as needed for headache or mild pain.   aspirin 81 MG tablet Take 81 mg by mouth daily.   atorvastatin 80 MG tablet Commonly known as:  LIPITOR Take 1 tablet (80 mg total) by mouth daily at 6 PM.   clopidogrel 75 MG tablet Commonly known as:  PLAVIX Take 1 tablet (75 mg total) by mouth daily. Start taking on:  11/27/2016   finasteride 5 MG tablet Commonly known as:  PROSCAR Take 5 mg by mouth daily.   isosorbide mononitrate 30 MG 24 hr tablet Commonly known as:  IMDUR Take 1 tablet (30 mg total) by mouth daily. Start taking on:  11/27/2016   lisinopril 10 MG tablet Commonly known  as:  PRINIVIL,ZESTRIL Take 1 tablet (10 mg total) by mouth daily.   nitroGLYCERIN 0.4 MG SL tablet Commonly known as:  NITROSTAT Place 1 tablet (0.4 mg total) under the tongue every 5 (five) minutes x 3 doses as needed for chest pain.   tamsulosin 0.4 MG Caps capsule Commonly known as:  FLOMAX Take 0.4 mg by mouth daily.   VITAMIN D PO Take 1 capsule by mouth daily.        Outstanding Labs/Studies   1. Follow-up in 5-7 days  Duration of Discharge Encounter   Greater than 30 minutes including physician time.  Kristopher Glee PA-C 11/26/2016, 3:09 PM    I have examined the patient and reviewed assessment and plan and discussed with patient.  Agree with above as stated.  No good option for revascularization.  Plan for medical therapy. COntinue Imdur.  If he has more angina, plan for additional antianginal therapy.  Beta blocker limited by slow HR. Started atorvastatin, LDL target 70. Continue DAPT for now.  Could consider stopping aspirin in the future. Agressive BP, and lipid control.   Explained to patient and wife.  He would like a new primary care MD in [place].  Will try to arrange this at f/u appt in a week or so.    Larae Grooms

## 2016-11-26 NOTE — Progress Notes (Signed)
Patient in a stable condition, discharge education completed with patient and wife at bedside, they verbalised understanding, patient belongings at bedside, iv removed , tele dc ccmd notified

## 2016-11-26 NOTE — Care Management Note (Signed)
Case Management Note Marvetta Gibbons RN, BSN Unit 2W-Case Manager [phone]  Patient Details  Name: Ronald Robinson MRN: [id number] Date of Birth: [birth year]  Subjective/Objective:  Pt admitted with NSTEMI, s/p with MVD, ?CABG awaiting consult                  Action/Plan: PTA Pt lived at home with wife- CM will follow for d/c needs post op CABG- would anticipate return home with wife.   Expected Discharge Date:  11/26/16               Expected Discharge Plan:  Home/Self Care  In-House Referral:     Discharge planning Services  CM Consult  Post Acute Care Choice:    Choice offered to:     DME Arranged:    DME Agency:     HH Arranged:    HH Agency:     Status of Service:  In process, will continue to follow  If discussed at Long Length of Stay Meetings, dates discussed:    Additional Comments:  11/26/16- 53- Ronald Burgett RN, CM- CVTS has consulted and determined pt not a good candidate for CABG and best option to tx medically- pt to d/c home with wife- no CM needs noted.   Dawayne Patricia, RN 11/26/2016, 4:23 PM

## 2016-11-26 NOTE — Progress Notes (Signed)
Patient ID: Ronald Robinson, male   DOB: [birth year], 76 y.o.   MRN: [id number]  Cardiothoracic Surgery:  Full consult to follow. I have reviewed his cath, examined him and his chart, talked with him and his wife. I don't think CABG is the best treatment for him. His LAD system is not graftable. The LCX and RCA systems are diffusely diseased and it is not possible to graft beyond the disease. He has mild chest burning but no other symptoms and normal energy level, still working every day. I would treat him medically.

## 2016-11-26 NOTE — Progress Notes (Addendum)
Patient Name: Ronald Robinson Date of Encounter: 11/26/2016  Primary Cardiologist:  Dr. Glenice Bow Problem List     Principal Problem:   Non-ST elevation (NSTEMI) myocardial infarction [facility]) Active Problems:   Essential hypertension   Family history of coronary artery disease   PAF (paroxysmal atrial fibrillation) (HCC)   BPH (benign prostatic hyperplasia)     Subjective   No chest burning since transfer to [facility]. Patient is feeling well. Reports surgery has seen him this morning and recommends medical management of cardiac disease. No complaints at this time.  Inpatient Medications    Scheduled Meds: . aspirin EC  81 mg Oral Daily  . atorvastatin  80 mg Oral q1800  . isosorbide mononitrate  30 mg Oral Daily  . ranolazine  500 mg Oral BID  . sodium chloride flush  3 mL Intravenous Q12H   Continuous Infusions: . sodium chloride Stopped (11/25/16 0631)  . heparin 1,050 Units/hr (11/26/16 0344)   PRN Meds: sodium chloride, acetaminophen, ALPRAZolam, diazepam, nitroGLYCERIN, ondansetron (ZOFRAN) IV, sodium chloride flush, zolpidem   Vital Signs    Vitals:   11/25/16 1003 11/25/16 1450 11/25/16 2033 11/26/16 0342  BP: 128/71 113/69 (!) 122/59 138/87  Pulse:  (!) 51 (!) 49 (!) 58  Resp:  12 17 17   Temp: 98.8 F (37.1 C) 98.7 F (37.1 C) 98.6 F (37 C) 97.8 F (36.6 C)  TempSrc: Oral Oral Oral Oral  SpO2: 99% 99% 98% 98%  Weight:      Height:        Intake/Output Summary (Last 24 hours) at 11/26/16 0850 Last data filed at 11/25/16 2227  Gross per 24 hour  Intake           839.63 ml  Output                0 ml  Net           839.63 ml   Filed Weights   11/24/16 0004 11/24/16 0604  Weight: 184 lb (83.5 kg) 189 lb 12.8 oz (86.1 kg)    Physical Exam   GEN:No acute distress.   Neck:No JVD Cardiac:RRR, no murmurs, rubs, or gallops.  Respiratory:Clear to auscultation bilaterally. [id number], nontender, non-distended  MS:No edema; No deformity.  Radial cath site without hematoma Neuro:Nonfocal  Psych: Normal affect   Labs    CBC  Recent Labs  11/24/16 0012 11/25/16 0217 11/26/16 0207  WBC 5.8 5.0 5.2  NEUTROABS 3.3  --   --   HGB 15.7 13.2 13.5  HCT 46.6 40.7 40.9  MCV 81.0 81.6 81.6  PLT 215 197 0000000   Basic Metabolic Panel  Recent Labs  11/24/16 0012 11/25/16 0217  NA 136 138  K 3.7 3.8  CL 104 107  CO2 25 24  GLUCOSE 145* 87  BUN 20 17  CREATININE 0.65 0.71  CALCIUM 9.3 8.8*  MG 2.0  --    Liver Function Tests  Recent Labs  11/24/16 0012  AST 37  ALT 29  ALKPHOS 68  BILITOT 0.7  PROT 7.0  ALBUMIN 3.8   No results for input(s): LIPASE, AMYLASE in the last 72 hours. Cardiac Enzymes  Recent Labs  11/24/16 0726 11/24/16 1248 11/25/16 0754  TROPONINI 2.76* 4.78* 4.68*   Fasting Lipid Panel  Recent Labs  11/25/16 0217  CHOL 146  HDL 49  LDLCALC 90  TRIG 34  CHOLHDL 3.0   Thyroid Function Tests  Recent Labs  11/24/16 0726  TSH  1.911    Telemetry    NS to NB, occasional PVC - Personally Reviewed  ECG    None, no new EKG's since 11/24/2016  Radiology    None  Cardiac Studies   Left Heart Cath and Coronary Angiography 11/2016   Prox RCA to Mid RCA lesion, 70 %stenosed.  Post Atrio lesion, 90 %stenosed.  Ramus lesion, 80 %stenosed.  Prox LAD to Mid LAD lesion, 95 %stenosed.  Dist LAD-2 lesion, 95 %stenosed.  Dist LAD-1 lesion, 90 %stenosed.  Ost 1st Mrg lesion, 90 %stenosed.  Mid Cx lesion, 80 %stenosed.  Dist Cx lesion, 99 %stenosed.  Dist RCA lesion, 40 %stenosed.   Relatively preserved global LV contractility with an catenoid  appearing left ventricle.  LVEDP is normal.  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   RECOMMENDATION: Surgical consultation will be obtained.  However, the patient may have poor targets, particularly in the LAD for revascularization.  The patient apparently  received a dose of Plavix 300 mg at [facility] before transferring to [facility] hospital; this will be discontinued for surgical evaluation.  Aggressive medical therapy will be implemented with beta blocker, nitrates, continuation of amlodipine and lisinopril, as well as the addition of ranolazine.  High potency statin therapy will be implemented.  If the patient is not felt to be an operative candidate, would recommend institution of dual platelet therapy.   Patient Profile     76 y.o. male with no history of CAD but has been evaluated for chest pain in the past. He has a negative stress echo at [facility] in 2013.  He has HTN and a strong family history of CAD (his father, 3 brother, and 2 sisters all with a history of CAD). The pt presented to [place] round midnight 11/23/16. He presented with a complaint of SSCP "burning". He said his symptoms come on after he eats and the he has had these symptoms "for two years". He insists that nothing else unusual happened last night, he just decided to go to the ER and have it checked out. He denies any exertional symptoms, associated SOB, or radiation to his jaw or arms. In the ED his EKG initially showed AF with CVR. He was unaware of palpitations or tachycardia. His Troponin was 1.34-2.13. His subsequent EKGs show subtle AS ST elevations and TWI. He is pain free. Plavix 300 mg given at [facility] and the pt was transferred here  Assessment & Plan    NSTEMI -  cardiac cath yesterday, with severe diffuse multivessel CAD with LAD, ramus, LCX and RCA.  Surgical consult obtained- still pending.  - Continue aggressive medical therapy. - Pt did receive 300 mg of plavix on 11/24/16, discontinued - 11/25/2016; Troponin 4.68,  continue IV heparin.   - Cardiothoracic Surgery recommends medical management  CAD - see above.  PAF -  SR currently  HTN - essential.    HLD - LDL 90 now on high dose statin.  Hypocalcemia- Mildly low  calcium 8.8  Signed, GREENE,TIFFANY G, PA-C  11/26/2016, 8:50 AM    I have examined the patient and reviewed assessment and plan and discussed with patient.  Agree with above as stated.  Plan for medical therapy.  2 days post cath.  Stop heparin. Normal LV function. Medical therapy.  HR already slow. Restart Plavix 75 mg daily. If he does well without angina, can discharge later today.  Stop Ranexa.  Try  to keep antianginal regimen simpler with just Imdur for now.  Add back Ranexa if needed.   Larae Grooms

## 2016-11-26 NOTE — Telephone Encounter (Signed)
New message    TOC appt made on 2/21 at 2:00pm per Delos Haring.

## 2016-11-26 NOTE — Telephone Encounter (Signed)
New message    Delos Haring called back-pt is Dr. Irish Lack. TOC appt rescheduled with Brittainy Simmons on 2.20 at 2pm.

## 2016-11-26 NOTE — Consult Note (Signed)
[address]       ,[place] [address]             [phone]      Cardiothoracic Surgery Consultation  Reason for Consult: severe multi-vessel coronary artery disease Referring Physician: Dr. Dorris Singh Ronald Robinson is an 76 y.o. male.  HPI:   The patient is a 76 year old gentleman with hypertension, PAF, strong family history of heart disease who was admitted through [place] after presenting with SSCP that he describes as a burning. He has had this for a few years and frequently comes on after eating. He works every day doing Magazine features editor and says that he does not get this with exertion and has normal energy level. He just decided to get it checked out. He is followed by a PCP in [facility]. He had an ECG in the ED showing AF with controlled rate and troponin elevated at 1.34-2.13. Subsequent ECG showed subtle AS ST elevation and TWI. He was given 300 mg Plavix at [facility] and transferred here. Cath on 2/14 shows 3 vessel disease with diffusely diseased vessels and normal EF.  Past Medical History:  Diagnosis Date  . CAD in native artery  11/26/2016  . Enlarged prostate   . Hypertension     Past Surgical History:  Procedure Laterality Date  . HERNIA REPAIR    . LEFT HEART CATH AND CORONARY ANGIOGRAPHY N/A 11/24/2016   Procedure: Left Heart Cath and Coronary Angiography;  Surgeon: Troy Sine, MD;  Location: [employer] [facility];  Service: Cardiovascular;  Laterality: N/A;    Family History  Problem Relation Age of Onset  . Coronary artery disease Father   . Coronary artery disease Sister   . Coronary artery disease Brother     Social History:  reports that he has never smoked. He has never used smokeless tobacco. He reports that he drinks alcohol. He reports that he does not use drugs.  Allergies: No Known Allergies  Medications:  I have reviewed the patient's current medications. Prior to Admission:  Prescriptions Prior to Admission    Medication Sig Dispense Refill Last Dose  . amLODipine (NORVASC) 10 MG tablet Take 10 mg by mouth daily.   Past Week at Unknown time  . aspirin 81 MG tablet Take 81 mg by mouth daily.    Past Week at Unknown time  . Cholecalciferol (VITAMIN D PO) Take 1 capsule by mouth daily.   Past Week at Unknown time  . finasteride (PROSCAR) 5 MG tablet Take 5 mg by mouth daily.   Past Week at Unknown time  . tamsulosin (FLOMAX) 0.4 MG CAPS capsule Take 0.4 mg by mouth daily.    Past Week at Unknown time  . lisinopril (PRINIVIL,ZESTRIL) 10 MG tablet Take 1 tablet (10 mg total) by mouth daily. (Patient not taking: Reported on 11/25/2016) 30 tablet 1 Not Taking at Unknown time   Scheduled: . aspirin EC  81 mg Oral Daily  . atorvastatin  80 mg Oral q1800  . clopidogrel  75 mg Oral Daily  . isosorbide mononitrate  30 mg Oral Daily  . sodium chloride flush  3 mL Intravenous Q12H   Continuous: . sodium chloride Stopped (11/25/16 0631)   XQJ:JHERDE chloride, acetaminophen, ALPRAZolam, diazepam, nitroGLYCERIN, ondansetron (ZOFRAN) IV, sodium chloride flush, zolpidem Anti-infectives    None      Results for orders placed or performed during the hospital encounter of 11/24/16 (from the past  48 hour(s))  Basic metabolic panel     Status: Abnormal   Collection Time: 11/25/16  2:17 AM  Result Value Ref Range   Sodium 138 135 - 145 mmol/L   Potassium 3.8 3.5 - 5.1 mmol/L   Chloride 107 101 - 111 mmol/L   CO2 24 22 - 32 mmol/L   Glucose, Bld 87 65 - 99 mg/dL   BUN 17 6 - 20 mg/dL   Creatinine, Ser 0.71 0.61 - 1.24 mg/dL   Calcium 8.8 (L) 8.9 - 10.3 mg/dL   GFR calc non Af Amer >60 >60 mL/min   GFR calc Af Amer >60 >60 mL/min    Comment: (NOTE) The eGFR has been calculated using the CKD EPI equation. This calculation has not been validated in all clinical situations. eGFR's persistently <60 mL/min signify possible Chronic Kidney Disease.    Anion gap 7 5 - 15  Heparin level (unfractionated)      Status: Abnormal   Collection Time: 11/25/16  2:17 AM  Result Value Ref Range   Heparin Unfractionated 0.26 (L) 0.30 - 0.70 IU/mL    Comment:        IF HEPARIN RESULTS ARE BELOW EXPECTED VALUES, AND PATIENT DOSAGE HAS BEEN CONFIRMED, SUGGEST FOLLOW UP TESTING OF ANTITHROMBIN III LEVELS.   CBC     Status: None   Collection Time: 11/25/16  2:17 AM  Result Value Ref Range   WBC 5.0 4.0 - 10.5 K/uL   RBC 4.99 4.22 - 5.81 MIL/uL   Hemoglobin 13.2 13.0 - 17.0 g/dL   HCT 40.7 39.0 - 52.0 %   MCV 81.6 78.0 - 100.0 fL   MCH 26.5 26.0 - 34.0 pg   MCHC 32.4 30.0 - 36.0 g/dL   RDW 14.3 11.5 - 15.5 %   Platelets 197 150 - 400 K/uL  Lipid panel     Status: None   Collection Time: 11/25/16  2:17 AM  Result Value Ref Range   Cholesterol 146 0 - 200 mg/dL   Triglycerides 34 <150 mg/dL   HDL 49 >40 mg/dL   Total CHOL/HDL Ratio 3.0 RATIO   VLDL 7 0 - 40 mg/dL   LDL Cholesterol 90 0 - 99 mg/dL    Comment:        Total Cholesterol/HDL:CHD Risk Coronary Heart Disease Risk Table                     Men   Women  1/2 Average Risk   3.4   3.3  Average Risk       5.0   4.4  2 X Average Risk   9.6   7.1  3 X Average Risk  23.4   11.0        Use the calculated Patient Ratio above and the CHD Risk Table to determine the patient's CHD Risk.        ATP III CLASSIFICATION (LDL):  <100     mg/dL   Optimal  100-129  mg/dL   Near or Above                    Optimal  130-159  mg/dL   Borderline  160-189  mg/dL   High  >190     mg/dL   Very High   Troponin I     Status: Abnormal   Collection Time: 11/25/16  7:54 AM  Result Value Ref Range   Troponin I 4.68 (HH) <0.03 ng/mL  Comment: CRITICAL VALUE NOTED.  VALUE IS CONSISTENT WITH PREVIOUSLY REPORTED AND CALLED VALUE.  Heparin level (unfractionated)     Status: None   Collection Time: 11/25/16 10:02 AM  Result Value Ref Range   Heparin Unfractionated 0.59 0.30 - 0.70 IU/mL    Comment:        IF HEPARIN RESULTS ARE BELOW EXPECTED VALUES,  AND PATIENT DOSAGE HAS BEEN CONFIRMED, SUGGEST FOLLOW UP TESTING OF ANTITHROMBIN III LEVELS.   Heparin level (unfractionated)     Status: Abnormal   Collection Time: 11/26/16  2:07 AM  Result Value Ref Range   Heparin Unfractionated 0.92 (H) 0.30 - 0.70 IU/mL    Comment:        IF HEPARIN RESULTS ARE BELOW EXPECTED VALUES, AND PATIENT DOSAGE HAS BEEN CONFIRMED, SUGGEST FOLLOW UP TESTING OF ANTITHROMBIN III LEVELS.   CBC     Status: None   Collection Time: 11/26/16  2:07 AM  Result Value Ref Range   WBC 5.2 4.0 - 10.5 K/uL   RBC 5.01 4.22 - 5.81 MIL/uL   Hemoglobin 13.5 13.0 - 17.0 g/dL   HCT 40.9 39.0 - 52.0 %   MCV 81.6 78.0 - 100.0 fL   MCH 26.9 26.0 - 34.0 pg   MCHC 33.0 30.0 - 36.0 g/dL   RDW 14.6 11.5 - 15.5 %   Platelets 175 150 - 400 K/uL    No results found.  Review of Systems  Constitutional: Negative for diaphoresis and malaise/fatigue.  HENT: Negative.   Eyes: Negative.   Respiratory: Negative for shortness of breath.   Cardiovascular: Positive for chest pain. Negative for palpitations, orthopnea, leg swelling and PND.  Gastrointestinal: Negative.   Genitourinary: Negative.   Musculoskeletal: Negative.   Skin: Negative.   Neurological: Negative.   Endo/Heme/Allergies: Negative.   Psychiatric/Behavioral: Negative.    Blood pressure 114/62, pulse 60, temperature 98.2 F (36.8 C), temperature source Oral, resp. rate 18, height 5' 7"  (1.702 m), weight 86.1 kg (189 lb 12.8 oz), SpO2 97 %. Physical Exam  Constitutional: He is oriented to person, place, and time. He appears well-developed and well-nourished. No distress.  HENT:  Head: Normocephalic and atraumatic.  Mouth/Throat: Oropharynx is clear and moist.  Eyes: EOM are normal. Pupils are equal, round, and reactive to light.  Neck: Normal range of motion. Neck supple. No JVD present. Thyromegaly present.  Cardiovascular:  No murmur heard. Irregular rhythm  Respiratory: Effort normal and breath sounds  normal. No respiratory distress.  GI: Soft. Bowel sounds are normal. He exhibits no distension. There is no tenderness.  Musculoskeletal: He exhibits no edema.  Neurological: He is alert and oriented to person, place, and time.  Skin: Skin is warm and dry.  Psychiatric: He has a normal mood and affect.   Ronald Robinson  Cardiac catheterization  Order# [id number]  Reading physician: Troy Sine, MD Ordering physician: Troy Sine, MD Study date: 11/24/16  Physicians   Panel Physicians Referring Physician Case Authorizing Physician  Troy Sine, MD (Primary)    Procedures   Left Heart Cath and Coronary Angiography  Conclusion     Prox RCA to Mid RCA lesion, 70 %stenosed.  Post Atrio lesion, 90 %stenosed.  Ramus lesion, 80 %stenosed.  Prox LAD to Mid LAD lesion, 95 %stenosed.  Dist LAD-2 lesion, 95 %stenosed.  Dist LAD-1 lesion, 90 %stenosed.  Ost 1st Mrg lesion, 90 %stenosed.  Mid Cx lesion, 80 %stenosed.  Dist Cx lesion, 99 %stenosed.  Dist RCA lesion, 40 %stenosed.  Relatively preserved global LV contractility with an catenoid  appearing left ventricle.  LVEDP is normal.  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   RECOMMENDATION: Surgical consultation will be obtained.  However, the patient may have poor targets, particularly in the LAD for revascularization.  The patient apparently received a dose of Plavix 300 mg at [facility] [place] [facility] before transferring to [facility] hospital; this will be discontinued for surgical evaluation.  Aggressive medical therapy will be implemented with beta blocker, nitrates, continuation of amlodipine and lisinopril, as well as the addition of ranolazine.  High potency statin therapy will be implemented.  If the patient is not felt to be an operative candidate, would recommend institution of dual platelet therapy.   Indications   Non-ST elevation (NSTEMI) myocardial  infarction (HCC) [I21.4 (ICD-10-CM)]  Procedural Details/Technique   Technical Details Mr. Mines is a 76 year old African-American male who has a history of hypertension, and very strong family history for CAD. The patient has had intermittent chest pain. In 2013 reported stress test was normal. He recently developed increasing episodes of recurrent chest discomfort. He was presented to [facility] [place] with chest pain. He was given Plavix 300 mg. He was transferred to [facility] hospital for admission. His ECG has shown anterolateral T-wave inversion and troponins are mildly positive consistent with a non-ST segment elevation MI. He is referred for cardiac catheterization.  The patient was brought to the cardiac catheterization lab in the fasting state. The patient was premedicated with Versed 1 mg and fentanyl 25 mcg. A right radial approach was utilized after an Allen's test verified adequate circulation. The right radial artery was punctured via the Seldinger technique, and a 6 [place] Glidesheath Slender was inserted without difficulty. A radial cocktail consisting of Verapamil 3 mg was administered. The patient received weight adjusted heparin. A safety J wire was advanced into the ascending aorta. Diagnostic catheterization was done with a 5 [place] TIG 4.0 catheter . The RCA and ultimately an EBU catheter was necessary for selective engagement into the left coronary system.. A 5 French pigtail catheter was used for left ventriculography. A TR radial band was applied for hemostasis. The patient left the catheterization laboratory in stable condition.   Estimated blood loss <50 mL.  During this procedure the patient was administered the following to achieve and maintain moderate conscious sedation: Versed 1 mg, Fentanyl 25 mcg, while the patient's heart rate, blood pressure, and oxygen saturation were continuously monitored. The period of conscious sedation was 41 minutes, of which I was present  face-to-face 100% of this time.    Coronary Findings   Dominance: Right  Left Main  The left main coronary artery was normal and trifurcated into an LAD, and intermediate vessel, and left circumflex coronary artery.  Left Anterior Descending  There is severe diffuse disease throughout the vessel. The LAD was severely diffusely diseased. There was diffuse 95% proximal stenosis, diffuse 90% mid stenoses, and diffuse 95% distal stenoses.  Prox LAD to Mid LAD lesion, 95% stenosed.  Dist LAD-1 lesion, 90% stenosed.  Dist LAD-2 lesion, 95% stenosed.  Ramus Intermedius  The ramus vessel had diffuse 80% proximal stenoses.  Ramus lesion, 80% stenosed.  Left Circumflex  There is mild the vessel. Left circumflex vessel gave rise to a proximal marginal vessel that had 90% ostial stenosis. There was 80% stenosis in the region of this marginal takeoff. The mid AV groove circumflex was 99 100% occluded with faint trickle of  antegrade flow distally.  Mid Cx lesion, 80% stenosed.  Dist Cx lesion, 99% stenosed.  First Obtuse Marginal Branch  Ost 1st Mrg lesion, 90% stenosed.  Third Obtuse Marginal Branch  Vessel is small in size.  Right Coronary Artery  There is mild diffuse disease throughout the vessel. RCA had diffuse disease. There was 70% focal proximal stenosis, diffuse 30-40% stenoses in the mid segment, and significant diffuse irregularities in the PDA with 90% diffuse continuation branch stenosis.  Prox RCA to Mid RCA lesion, 70% stenosed.  Dist RCA lesion, 40% stenosed.  Right Posterior Descending Artery  There is moderate disease in the vessel.  Right Posterior Atrioventricular Branch  Post Atrio lesion, 90% stenosed.  Wall Motion              Left Heart   Left Ventricle The left ventricle has a catenoid appearance. Ejection fraction is 55-60%. LVEDP 13 mm Hg.    Coronary Diagrams   Diagnostic Diagram     Implants     No implant documentation for this case.  PACS Images    Show images for Cardiac catheterization   Link to Procedure Log   Procedure Log    Hemo Data   Flowsheet Row Most Recent Value  AO Systolic Pressure 591 mmHg  AO Diastolic Pressure 61 mmHg  AO Mean 80 mmHg  LV Systolic Pressure 368 mmHg  LV Diastolic Pressure 5 mmHg  LV EDP 13 mmHg  Arterial Occlusion Pressure Extended Systolic Pressure 599 mmHg  Arterial Occlusion Pressure Extended Diastolic Pressure 55 mmHg  Arterial Occlusion Pressure Extended Mean Pressure 82 mmHg  Left Ventricular Apex Extended Systolic Pressure 234 mmHg  Left Ventricular Apex Extended Diastolic Pressure 4 mmHg  Left Ventricular Apex Extended EDP Pressure 13 mmHg    Assessment/Plan:  He has severe diffuse three vessel coronary disease with normal LV function and presented with a couple year history of substernal chest burning usually after eating and some ECG changes with mildly positive troponin. He has normal stamina and no shortness of breath. I have personally reviewed his cath films. His LAD system is not graftable. The PDA is diffusely diseased and it is not possible to graft beyond the disease. The OM also has high grade distal stenosis which I could not graft beyond. The ramus is small. I don't think CABG is going to help him and I would recommend continued medical therapy and attention to maximum cardiac risk factor reduction. I reviewed the cath film results with him and his wife and my recommendations and answered their many questions.   I spent 60 minutes performing this consultation and > 50% of this time was spent face to face counseling and coordinating the care of this patient's multi-vessel coronary artery disease.  Gaye Pollack 11/26/2016, 3:44 PM

## 2016-11-26 NOTE — Progress Notes (Signed)
CARDIAC REHAB PHASE I   PRE:  Rate/Rhythm: 60 SR  BP:  Supine:   Sitting: 144/62  Standing:    SaO2: 100%RA  MODE:  Ambulation: 550 ft   POST:  Rate/Rhythm: 75 SR  BP:  Supine:   Sitting: 150/65  Standing:    SaO2: 100%RA' 1400-1512 Pt walked 550 ft on RA with steady gait. Tolerated well. No CP. MI education completed with pt and wife who voiced understanding. Reviewed NTG use, risk factors, MI restrictions, heart healthy diet, ex ed. Discussed CRP 2 and will refer to [place] program. Pt prefers to come here than [place] program.   Graylon Good, RN BSN  11/26/2016 3:05 PM

## 2016-11-29 ENCOUNTER — Encounter: Payer: Self-pay | Admitting: Cardiology

## 2016-11-29 NOTE — Telephone Encounter (Signed)
Follow Up:; ° ° °Returning your call. °

## 2016-11-29 NOTE — Telephone Encounter (Signed)
LM TO CALL BACK ./CY 

## 2016-11-29 NOTE — Telephone Encounter (Signed)
PER  PT  FEELS  REAL GOOD  NO QUESTIONS OR COMPLAINTS   IS  AWARE OF  APPT TOM  AT  2:00 PM  WITH BRITTAINY  SIMMONS .Adonis Housekeeper

## 2016-11-30 ENCOUNTER — Encounter: Payer: Self-pay | Admitting: *Deleted

## 2016-11-30 ENCOUNTER — Encounter (INDEPENDENT_AMBULATORY_CARE_PROVIDER_SITE_OTHER): Payer: Self-pay

## 2016-11-30 ENCOUNTER — Encounter: Payer: Self-pay | Admitting: Cardiology

## 2016-11-30 ENCOUNTER — Ambulatory Visit (INDEPENDENT_AMBULATORY_CARE_PROVIDER_SITE_OTHER): Payer: Medicare Other | Admitting: Cardiology

## 2016-11-30 VITALS — BP 124/60 | HR 69 | Ht 67.0 in | Wt 187.0 lb

## 2016-11-30 DIAGNOSIS — I480 Paroxysmal atrial fibrillation: Secondary | ICD-10-CM

## 2016-11-30 NOTE — Progress Notes (Signed)
11/30/2016 Ronald Robinson   02/26/1941  [id number]  Primary Physician Annetta Maw, MD Primary Cardiologist: Ronald Robinson    Reason for Visit/CC: Ronald Robinson For CAD s/p NSTEMI; PAF  HPI:  Ronald Robinson presents to clinic today for f/u. He is a 76 y/o male with prior h/o HTN who was recently diagnosed with CAD. He initially presented to [facility] on 11/23/16 with a complaint of SSCP.  In the ED his EKG initially showed AF with CVR. He was unaware of palpitations or tachycardia. His Troponin was 1.34-2.13. His subsequent EKGs show subtle ST elevations and TWI.  Pt was transferred to Ronald Robinson for cardiology admission. Patient had a cardiac cath using the right radial approach  which showed diffuse multivessel CAD involving the LAD, ramus intermediate, left circumflex, and RCA.  EF normal at 55-60%. Pt not amenable to stent placement. CTS consulted and saw patient, they do not feel like CABG is the best treatment, his LAD system not graft able. The LCX and RCA systems are diffusely diseased and it is not possible to graft beyond the disease.  Ronald Robinson recommended medical therapy. He was placed on ASA, Plavix, Imdur and  Lisinopril.  He presents to clinic for post Robinson f/u. He reports that he has done well. He denies any recurrent CP. No dyspnea. He did have one episode of "indigestion" last week that occurred after eating cabbage and was relieved after drinking buttermilk. He denies any further recurrence. No exertional CP. He denies any palpitations. His EKG today shows NSR. BP is well controlled. He reports full medication compliance. No post cath complications. His radial cath site is stable.   Current Meds  Medication Sig  . acetaminophen (TYLENOL) 325 MG tablet Take 2 tablets (650 mg total) by mouth every 4 (four) hours as needed for headache or mild pain.  Marland Kitchen aspirin 81 MG tablet Take 81 mg by mouth daily.   Marland Kitchen atorvastatin (LIPITOR) 80 MG tablet Take 1 tablet (80 mg total) by mouth daily  at 6 PM.  . Cholecalciferol (VITAMIN D PO) Take 1 capsule by mouth daily.  . clopidogrel (PLAVIX) 75 MG tablet Take 1 tablet (75 mg total) by mouth daily.  . finasteride (PROSCAR) 5 MG tablet Take 5 mg by mouth daily.  . isosorbide mononitrate (IMDUR) 30 MG 24 hr tablet Take 1 tablet (30 mg total) by mouth daily.  Marland Kitchen lisinopril (PRINIVIL,ZESTRIL) 10 MG tablet Take 1 tablet (10 mg total) by mouth daily.  . nitroGLYCERIN (NITROSTAT) 0.4 MG SL tablet Place 1 tablet (0.4 mg total) under the tongue every 5 (five) minutes x 3 doses as needed for chest pain.  . tamsulosin (FLOMAX) 0.4 MG CAPS capsule Take 0.4 mg by mouth daily.    No Known Allergies Past Medical History:  Diagnosis Date  . CAD in native artery  11/26/2016  . Enlarged prostate   . Hypertension    Family History  Problem Relation Age of Onset  . Coronary artery disease Father   . Coronary artery disease Sister   . Coronary artery disease Brother    Past Surgical History:  Procedure Laterality Date  . HERNIA REPAIR    . LEFT HEART CATH AND CORONARY ANGIOGRAPHY N/A 11/24/2016   Procedure: Left Heart Cath and Coronary Angiography;  Surgeon: Ronald Sine, MD;  Location: [facility];  Service: Cardiovascular;  Laterality: N/A;   Social History   Social History  . Marital status: Married    Spouse name: N/A  .  Number of children: N/A  . Years of education: N/A   Occupational History  . Not on file.   Social History Main Topics  . Smoking status: Never Smoker  . Smokeless tobacco: Never Used  . Alcohol use Yes     Comment: rare beer  . Drug use: No  . Sexual activity: Not on file   Other Topics Concern  . Not on file   Social History Narrative  . No narrative on file     Review of Systems: General: negative for chills, fever, night sweats or weight changes.  Cardiovascular: negative for chest pain, dyspnea on exertion, edema, orthopnea, palpitations, paroxysmal nocturnal dyspnea or shortness of  breath Dermatological: negative for rash Respiratory: negative for cough or wheezing Urologic: negative for hematuria Abdominal: negative for nausea, vomiting, diarrhea, bright red blood per rectum, melena, or hematemesis Neurologic: negative for visual changes, syncope, or dizziness All other systems reviewed and are otherwise negative except as noted above.   Physical Exam:  Blood pressure 124/60, pulse 69, height 5\' 7"  (1.702 m), weight 187 lb (84.8 kg), SpO2 98 %.  General appearance: alert, cooperative and no distress Neck: no carotid bruit and no JVD Lungs: clear to auscultation bilaterally Heart: regular rate and rhythm, S1, S2 normal, no murmur, click, rub or gallop Extremities: extremities normal, atraumatic, no cyanosis or edema Pulses: 2+ and symmetric Skin: Skin color, texture, turgor normal. No rashes or lesions Neurologic: Grossly normal  EKG NSR. No ischemia.   ASSESSMENT AND PLAN:   1. CAD: s/p recent NSTEMI. Cardiac cath showed severe diffuse multivessel CAD with LAD, ramus, LCX and RCA. Seen by CT surgery and deemed not a good candidate for CABG given poor targets. Medical therapy recommended. He is stable w/o recurrent angina. Continue DAPT with ASA + Plavix, Imdur and Lipitor.   2. HTN: controlled on current regimen. Check repeat BMP today given he was just placed on Lisinopril.   3. PAF: atrial fibrillation noted on admission EKG but it was noted that he converted during hospitalization and remained in NSR there after. EKG today shows NSR. His CHA2DS2 VASc score is at least 3 for HTN, vascular disease and age 34-74. Anticoagulation was not addressed during his hospitalization. ? If this was just transient atrial fibrillation related to his ACS. Given his calculated CHA2DS2 VASc score and potential risk for stroke, I recommend 30 day evaluation with an outpatient montior to r/o possible recurrence of atrial fibrillation. If he proves to have recurrent atrial  fibrillation, would recommend iniitation of long term oral anticoagulation. For now, continue ASA and Plavix.     PLAN  F/u in 5-6 weeks for repeat assessment after heart monitor.   Lyda Jester [facility] 11/30/2016 4:34 PM

## 2016-11-30 NOTE — Patient Instructions (Addendum)
Medication Instructions:   Your physician recommends that you continue on your current medications as directed. Please refer to the Current Medication list given to you today.   If you need a refill on your cardiac medications before your next appointment, please call your pharmacy.  Labwork: BMET TODAY    Testing/Procedures: Your physician has recommended that you wear an event monitor. Event monitors are medical devices that record the heart's electrical activity. Doctors most often [place] these monitors to diagnose arrhythmias. Arrhythmias are problems with the speed or rhythm of the heartbeat. The monitor is a small, portable device. You can wear one while you do your normal daily activities. This is usually used to diagnose what is causing palpitations/syncope (passing out).   Follow-Up:  IN 5 TO 6 WEEKS WITH DR Irish Lack AFTER MONITOR    Any Other Special Instructions Will Be Listed Below (If Applicable).                                                                                                                                                   Heart-Healthy Eating Plan Introduction Heart-healthy meal planning includes:  Limiting unhealthy fats.  Increasing healthy fats.  Making other small dietary changes. You may need to talk with your doctor or a diet specialist (dietitian) to create an eating plan that is right for you. What types of fat should I choose?  Choose healthy fats. These include olive oil and canola oil, flaxseeds, walnuts, almonds, and seeds.  Eat more omega-3 fats. These include salmon, mackerel, sardines, tuna, flaxseed oil, and ground flaxseeds. Try to eat fish at least twice each week.  Limit saturated fats.  Saturated fats are often found in animal products, such as meats, butter, and cream.  Plant sources of saturated fats include palm oil, palm kernel oil, and coconut oil.  Avoid foods with partially hydrogenated oils in them. These include  stick margarine, some tub margarines, cookies, crackers, and other baked goods. These contain trans fats. What general guidelines do I need to follow?  Check food labels carefully. Identify foods with trans fats or high amounts of saturated fat.  Fill one half of your plate with vegetables and green salads. Eat 4-5 servings of vegetables per day. A serving of vegetables is:  1 cup of raw leafy vegetables.   cup of raw or cooked cut-up vegetables.   cup of vegetable juice.  Fill one fourth of your plate with whole grains. Look for the word "whole" as the first word in the ingredient list.  Fill one fourth of your plate with lean protein foods.  Eat 4-5 servings of fruit per day. A serving of fruit is:  One medium whole fruit.   cup of dried fruit.   cup of fresh, frozen, or canned fruit.   cup of 100% fruit juice.  Eat more foods  that contain soluble fiber. These include apples, broccoli, carrots, beans, peas, and barley. Try to get 20-30 g of fiber per day.  Eat more home-cooked food. Eat less restaurant, buffet, and fast food.  Limit or avoid alcohol.  Limit foods high in starch and sugar.  Avoid fried foods.  Avoid frying your food. Try baking, boiling, grilling, or broiling it instead. You can also reduce fat by:  Removing the skin from poultry.  Removing all visible fats from meats.  Skimming the fat off of stews, soups, and gravies before serving them.  Steaming vegetables in water or broth.  Lose weight if you are overweight.  Eat 4-5 servings of nuts, legumes, and seeds per week:  One serving of dried beans or legumes equals  cup after being cooked.  One serving of nuts equals 1 ounces.  One serving of seeds equals  ounce or one tablespoon.  You may need to keep track of how much salt or sodium you eat. This is especially true if you have high blood pressure. Talk with your doctor or dietitian to get more information. What foods can I  eat? Grains  Breads, including [place], white, pita, wheat, raisin, rye, oatmeal, and [place]. Tortillas that are neither fried nor made with lard or trans fat. Low-fat rolls, including hotdog and hamburger buns and English muffins. Biscuits. Muffins. Waffles. Pancakes. Light popcorn. Whole-grain cereals. Flatbread. Melba toast. Pretzels. Breadsticks. Rusks. Low-fat snacks. Low-fat crackers, including oyster, saltine, matzo, graham, animal, and rye. Rice and pasta, including brown rice and pastas that are made with whole wheat. Vegetables  All vegetables. Fruits  All fruits, but limit coconut. Meats and Other Protein Sources  Lean, well-trimmed beef, veal, pork, and lamb. Chicken and [place] without skin. All fish and shellfish. Wild duck, rabbit, pheasant, and venison. Egg whites or low-cholesterol egg substitutes. Dried beans, peas, lentils, and tofu. Seeds and most nuts. Dairy  Low-fat or nonfat cheeses, including ricotta, string, and mozzarella. Skim or 1% milk that is liquid, powdered, or evaporated. Buttermilk that is made with low-fat milk. Nonfat or low-fat yogurt. Beverages  Mineral water. Diet carbonated beverages. Sweets and Desserts  Sherbets and fruit ices. Honey, jam, marmalade, jelly, and syrups. Meringues and gelatins. Pure sugar candy, such as hard candy, jelly beans, gumdrops, mints, marshmallows, and small amounts of dark chocolate. [employer] Eat all sweets and desserts in moderation. Fats and Oils  Nonhydrogenated (trans-free) margarines. Vegetable oils, including soybean, sesame, sunflower, olive, peanut, safflower, corn, canola, and cottonseed. Salad dressings or mayonnaise made with a vegetable oil. Limit added fats and oils that you use for cooking, baking, salads, and as spreads. Other  Cocoa powder. Coffee and tea. All seasonings and condiments. The items listed above may not be a complete list of recommended foods or beverages. Contact your dietitian for more  options.  What foods are not recommended? Grains  Breads that are made with saturated or trans fats, oils, or whole milk. Croissants. Butter rolls. Cheese breads. Sweet rolls. Donuts. Buttered popcorn. Chow mein noodles. High-fat crackers, such as cheese or butter crackers. Meats and Other Protein Sources  Fatty meats, such as hotdogs, short ribs, sausage, spareribs, bacon, rib eye roast or steak, and mutton. High-fat deli meats, such as salami and bologna. Caviar. Domestic duck and goose. Organ meats, such as kidney, liver, sweetbreads, and heart. Dairy  Cream, sour cream, cream cheese, and creamed cottage cheese. Whole-milk cheeses, including blue (bleu), Monte[place] Jack, [place], [place], American, [place], Swiss, cheddar, [place], and [place].  Whole or 2% milk that is liquid, evaporated, or condensed. Whole buttermilk. Cream sauce or high-fat cheese sauce. Yogurt that is made from whole milk. Beverages  Regular sodas and juice drinks with added sugar. Sweets and Desserts  Frosting. Pudding. Cookies. Cakes other than angel food cake. Candy that has milk chocolate or white chocolate, hydrogenated fat, butter, coconut, or unknown ingredients. Buttered syrups. Full-fat ice cream or ice cream drinks. Fats and Oils  Gravy that has suet, meat fat, or shortening. Cocoa butter, hydrognated oils, palm oil, coconut oil, palm kernel oil. These can often be found in baked products, candy, fried foods, nondairy creamers, and whipped toppings. Solid fats and shortenings, including bacon fat, salt pork, lard, and butter. Nondairy cream substitutes, such as coffee creamers and sour cream substitutes. Salad dressings that are made of unknown oils, cheese, or sour cream. The items listed above may not be a complete list of foods and beverages to avoid. Contact your dietitian for more information.  This information is not intended to replace advice given to you by your health care provider. Make sure you discuss any  questions you have with your health care provider. Document Released: 03/28/2012 Document Revised: 03/04/2016 Document Reviewed: 03/21/2014  2017 Elsevier

## 2016-12-01 ENCOUNTER — Ambulatory Visit: Payer: Medicare Other | Admitting: Nurse Practitioner

## 2016-12-01 LAB — BASIC METABOLIC PANEL
BUN/Creatinine Ratio: 17 (ref 10–24)
BUN: 13 mg/dL (ref 8–27)
CO2: 21 mmol/L (ref 18–29)
Calcium: 9.5 mg/dL (ref 8.6–10.2)
Chloride: 96 mmol/L (ref 96–106)
Creatinine, Ser: 0.77 mg/dL (ref 0.76–1.27)
GFR calc Af Amer: 103 (ref >59)
GFR calc non Af Amer: 89 (ref >59)
Glucose: 92 mg/dL (ref 65–99)
Potassium: 4.2 mmol/L (ref 3.5–5.2)
Sodium: 140 mmol/L (ref 134–144)

## 2016-12-04 ENCOUNTER — Other Ambulatory Visit: Payer: Self-pay

## 2016-12-04 ENCOUNTER — Emergency Department (HOSPITAL_COMMUNITY): Payer: Medicare Other

## 2016-12-04 ENCOUNTER — Inpatient Hospital Stay (HOSPITAL_COMMUNITY)
Admission: EM | Admit: 2016-12-04 | Discharge: 2016-12-06 | DRG: 281 | Disposition: A | Discharge status: Home / Self Care | Payer: Medicare Other | Attending: Cardiology | Admitting: Cardiology

## 2016-12-04 ENCOUNTER — Encounter (HOSPITAL_COMMUNITY): Payer: Self-pay | Admitting: Emergency Medicine

## 2016-12-04 DIAGNOSIS — I2583 Coronary atherosclerosis due to lipid rich plaque: Secondary | ICD-10-CM

## 2016-12-04 DIAGNOSIS — Z8249 Family history of ischemic heart disease and other diseases of the circulatory system: Secondary | ICD-10-CM | POA: Diagnosis not present

## 2016-12-04 DIAGNOSIS — Z9889 Other specified postprocedural states: Secondary | ICD-10-CM

## 2016-12-04 DIAGNOSIS — I2510 Atherosclerotic heart disease of native coronary artery without angina pectoris: Secondary | ICD-10-CM

## 2016-12-04 DIAGNOSIS — I214 Non-ST elevation (NSTEMI) myocardial infarction: Secondary | ICD-10-CM | POA: Diagnosis not present

## 2016-12-04 DIAGNOSIS — I25110 Atherosclerotic heart disease of native coronary artery with unstable angina pectoris: Principal | ICD-10-CM | POA: Diagnosis present

## 2016-12-04 DIAGNOSIS — Z7982 Long term (current) use of aspirin: Secondary | ICD-10-CM

## 2016-12-04 DIAGNOSIS — I200 Unstable angina: Secondary | ICD-10-CM | POA: Diagnosis not present

## 2016-12-04 DIAGNOSIS — R001 Bradycardia, unspecified: Secondary | ICD-10-CM | POA: Diagnosis present

## 2016-12-04 DIAGNOSIS — I480 Paroxysmal atrial fibrillation: Secondary | ICD-10-CM

## 2016-12-04 DIAGNOSIS — N400 Enlarged prostate without lower urinary tract symptoms: Secondary | ICD-10-CM | POA: Diagnosis present

## 2016-12-04 DIAGNOSIS — E785 Hyperlipidemia, unspecified: Secondary | ICD-10-CM | POA: Diagnosis present

## 2016-12-04 DIAGNOSIS — I10 Essential (primary) hypertension: Secondary | ICD-10-CM | POA: Diagnosis present

## 2016-12-04 DIAGNOSIS — Z79899 Other long term (current) drug therapy: Secondary | ICD-10-CM | POA: Diagnosis not present

## 2016-12-04 HISTORY — DX: Hyperlipidemia, unspecified: E78.5

## 2016-12-04 LAB — CBC
HCT: 43.6 % (ref 39.0–52.0)
Hemoglobin: 14.4 g/dL (ref 13.0–17.0)
MCH: 27.2 pg (ref 26.0–34.0)
MCHC: 33 g/dL (ref 30.0–36.0)
MCV: 82.3 fL (ref 78.0–100.0)
PLATELETS: 248 10*3/uL (ref 150–400)
RBC: 5.3 MIL/uL (ref 4.22–5.81)
RDW: 14.1 % (ref 11.5–15.5)
WBC: 5 10*3/uL (ref 4.0–10.5)

## 2016-12-04 LAB — MRSA PCR SCREENING: MRSA by PCR: NEGATIVE

## 2016-12-04 LAB — HEPATIC FUNCTION PANEL
ALBUMIN: 3.6 g/dL (ref 3.5–5.0)
ALT: 31 U/L (ref 17–63)
AST: 27 U/L (ref 15–41)
Alkaline Phosphatase: 72 U/L (ref 38–126)
Bilirubin, Direct: <0.1 mg/dL — ABNORMAL LOW (ref 0.1–0.5)
TOTAL PROTEIN: 6.7 g/dL (ref 6.5–8.1)
Total Bilirubin: 0.4 mg/dL (ref 0.3–1.2)

## 2016-12-04 LAB — BASIC METABOLIC PANEL
Anion gap: 12 (ref 5–15)
BUN: 18 mg/dL (ref 6–20)
CHLORIDE: 105 mmol/L (ref 101–111)
CO2: 21 mmol/L — ABNORMAL LOW (ref 22–32)
CREATININE: 0.78 mg/dL (ref 0.61–1.24)
Calcium: 9.3 mg/dL (ref 8.9–10.3)
GFR calc Af Amer: >60 mL/min (ref >60)
GFR calc non Af Amer: >60 mL/min (ref >60)
GLUCOSE: 103 mg/dL — AB (ref 65–99)
POTASSIUM: 4.1 mmol/L (ref 3.5–5.1)
SODIUM: 138 mmol/L (ref 135–145)

## 2016-12-04 LAB — TROPONIN I
Troponin I: 1.67 ng/mL (ref <0.03)
Troponin I: 1.7 ng/mL (ref <0.03)
Troponin I: 1.6 ng/mL (ref <0.03)

## 2016-12-04 LAB — I-STAT TROPONIN, ED: Troponin i, poc: 1.25 ng/mL (ref 0.00–0.08)

## 2016-12-04 LAB — PROTIME-INR
INR: 1.16
PROTHROMBIN TIME: 14.8 s (ref 11.4–15.2)

## 2016-12-04 LAB — HEPARIN LEVEL (UNFRACTIONATED): Heparin Unfractionated: 0.64 IU/mL (ref 0.30–0.70)

## 2016-12-04 LAB — MAGNESIUM: MAGNESIUM: 2.1 mg/dL (ref 1.7–2.4)

## 2016-12-04 MED ORDER — LISINOPRIL 10 MG PO TABS
10.0000 mg | ORAL_TABLET | Freq: Every day | ORAL | Status: DC
  Administered 2016-12-04 – 2016-12-06 (×3): 10 mg via ORAL
  Filled 2016-12-04 (×3): qty 1

## 2016-12-04 MED ORDER — CLOPIDOGREL BISULFATE 75 MG PO TABS
75.0000 mg | ORAL_TABLET | Freq: Every day | ORAL | Status: DC
  Administered 2016-12-04: 75 mg via ORAL
  Filled 2016-12-04: qty 1

## 2016-12-04 MED ORDER — NITROGLYCERIN 0.4 MG SL SUBL: 0.4000 mg | SUBLINGUAL_TABLET | SUBLINGUAL | Status: DC | PRN

## 2016-12-04 MED ORDER — VITAMIN D 1000 UNITS PO TABS
1000.0000 [IU] | ORAL_TABLET | Freq: Every day | ORAL | Status: DC
  Administered 2016-12-04 – 2016-12-06 (×3): 1000 [IU] via ORAL
  Filled 2016-12-04 (×4): qty 1

## 2016-12-04 MED ORDER — PANTOPRAZOLE SODIUM 40 MG PO TBEC
40.0000 mg | DELAYED_RELEASE_TABLET | Freq: Every day | ORAL | Status: DC
  Administered 2016-12-04 – 2016-12-06 (×3): 40 mg via ORAL
  Filled 2016-12-04 (×3): qty 1

## 2016-12-04 MED ORDER — SODIUM CHLORIDE 0.9 % IV SOLN
INTRAVENOUS | Status: DC
  Administered 2016-12-04: 10 mL/h via INTRAVENOUS

## 2016-12-04 MED ORDER — ALUM & MAG HYDROXIDE-SIMETH 200-200-20 MG/5ML PO SUSP
30.0000 mL | ORAL | Status: DC | PRN
  Administered 2016-12-04: 30 mL via ORAL
  Filled 2016-12-04: qty 30

## 2016-12-04 MED ORDER — ALPRAZOLAM 0.25 MG PO TABS: 0.2500 mg | ORAL_TABLET | Freq: Two times a day (BID) | ORAL | Status: DC | PRN

## 2016-12-04 MED ORDER — HEPARIN (PORCINE) IN NACL 100-0.45 UNIT/ML-% IJ SOLN
900.0000 [IU]/h | INTRAMUSCULAR | Status: DC
  Administered 2016-12-04 – 2016-12-05 (×2): 1000 [IU]/h via INTRAVENOUS
  Filled 2016-12-04 (×2): qty 250

## 2016-12-04 MED ORDER — ATORVASTATIN CALCIUM 80 MG PO TABS
80.0000 mg | ORAL_TABLET | Freq: Every day | ORAL | Status: DC
  Administered 2016-12-04 – 2016-12-05 (×2): 80 mg via ORAL
  Filled 2016-12-04 (×2): qty 1

## 2016-12-04 MED ORDER — FINASTERIDE 5 MG PO TABS
5.0000 mg | ORAL_TABLET | Freq: Every day | ORAL | Status: DC
  Administered 2016-12-04 – 2016-12-06 (×3): 5 mg via ORAL
  Filled 2016-12-04 (×3): qty 1

## 2016-12-04 MED ORDER — ACETAMINOPHEN 325 MG PO TABS: 650.0000 mg | ORAL_TABLET | ORAL | Status: DC | PRN

## 2016-12-04 MED ORDER — RANOLAZINE ER 500 MG PO TB12
500.0000 mg | ORAL_TABLET | Freq: Two times a day (BID) | ORAL | Status: DC
  Administered 2016-12-04 – 2016-12-06 (×4): 500 mg via ORAL
  Filled 2016-12-04 (×4): qty 1

## 2016-12-04 MED ORDER — GI COCKTAIL ~~LOC~~
30.0000 mL | Freq: Once | ORAL | Status: AC
  Administered 2016-12-04: 30 mL via ORAL
  Filled 2016-12-04: qty 30

## 2016-12-04 MED ORDER — AMIODARONE HCL 200 MG PO TABS
200.0000 mg | ORAL_TABLET | Freq: Two times a day (BID) | ORAL | Status: DC
  Administered 2016-12-04 – 2016-12-06 (×5): 200 mg via ORAL
  Filled 2016-12-04 (×5): qty 1

## 2016-12-04 MED ORDER — ASPIRIN 81 MG PO TABS: 81.0000 mg | ORAL_TABLET | Freq: Every day | ORAL | Status: DC

## 2016-12-04 MED ORDER — HEPARIN BOLUS VIA INFUSION
4000.0000 [IU] | Freq: Once | INTRAVENOUS | Status: AC
  Administered 2016-12-04: 4000 [IU] via INTRAVENOUS
  Filled 2016-12-04: qty 4000

## 2016-12-04 MED ORDER — NITROGLYCERIN IN D5W 200-5 MCG/ML-% IV SOLN
0.0000 ug/min | INTRAVENOUS | Status: DC
  Administered 2016-12-04: 5 ug/min via INTRAVENOUS
  Filled 2016-12-04: qty 250

## 2016-12-04 MED ORDER — ONDANSETRON HCL 4 MG/2ML IJ SOLN: 4.0000 mg | Freq: Four times a day (QID) | INTRAMUSCULAR | Status: DC | PRN

## 2016-12-04 MED ORDER — ASPIRIN 81 MG PO CHEW
324.0000 mg | CHEWABLE_TABLET | ORAL | Status: AC
  Filled 2016-12-04: qty 4

## 2016-12-04 MED ORDER — ASPIRIN EC 81 MG PO TBEC
81.0000 mg | DELAYED_RELEASE_TABLET | Freq: Every day | ORAL | Status: DC
  Administered 2016-12-05 – 2016-12-06 (×2): 81 mg via ORAL
  Filled 2016-12-04 (×2): qty 1

## 2016-12-04 MED ORDER — ZOLPIDEM TARTRATE 5 MG PO TABS: 5.0000 mg | ORAL_TABLET | Freq: Every evening | ORAL | Status: DC | PRN

## 2016-12-04 MED ORDER — TAMSULOSIN HCL 0.4 MG PO CAPS
0.4000 mg | ORAL_CAPSULE | Freq: Every day | ORAL | Status: DC
  Administered 2016-12-04 – 2016-12-06 (×3): 0.4 mg via ORAL
  Filled 2016-12-04 (×3): qty 1

## 2016-12-04 MED ORDER — ASPIRIN 300 MG RE SUPP: 300.0000 mg | RECTAL | Status: AC

## 2016-12-04 NOTE — Progress Notes (Signed)
ANTICOAGULATION CONSULT NOTE - Initial Consult  Pharmacy Consult for heparin Indication: chest pain/ACS  No Known Allergies  Patient Measurements: Height: 5\' 7"  (170.2 cm) Weight: 187 lb (84.8 kg) IBW/kg (Calculated) : 66.1  Vital Signs: Temp: 98.4 F (36.9 C) (02/24 0547) Temp Source: Oral (02/24 0547) BP: 132/87 (02/24 0600) Pulse Rate: 66 (02/24 0600)  Estimated Creatinine Clearance: 84.3 mL/min (by C-G formula based on SCr of 0.77 mg/dL).   Medical History: Past Medical History:  Diagnosis Date  . CAD in native artery  11/26/2016  . Enlarged prostate   . Hypertension     Assessment: 76yo male c/o sudden onset of burning in center of chest, had been in Afib w/ RVR but spontaneously converted, istat troponin found to be elevated, to begin heparin.  Goal of Therapy:  Heparin level 0.3-0.7 units/ml Monitor platelets by anticoagulation protocol: Yes   Plan:  Will give heparin 4000 units IV bolus x1 followed by gtt at 1000 units/hr and monitor heparin levels and CBC.  Wynona Neat, PharmD, BCPS  12/04/2016,6:36 AM

## 2016-12-04 NOTE — Progress Notes (Signed)
Pt having chest pain. Pt looks flushed VSS, RN increased nitro to 42mcg. Pt states pain is 1/10

## 2016-12-04 NOTE — ED Notes (Signed)
Dr Leonides Schanz given troponin results 1.25

## 2016-12-04 NOTE — ED Provider Notes (Signed)
Medical screening examination/treatment/procedure(s) were conducted as a shared visit with non-physician practitioner(s) and myself.  I personally evaluated the patient during the encounter.   EKG Interpretation  Date/Time:  Saturday December 04 2016 06:02:35 EST Ventricular Rate:  72 PR Interval:    QRS Duration: 94 QT Interval:  348 QTC Calculation: 381 R Axis:   50 Text Interpretation:  Sinus rhythm Borderline low voltage, extremity leads Abnormal T, consider ischemia, anterior leads Confirmed by Hue Steveson,  DO, Draxton Luu [phone]) on 12/04/2016 6:05:10 AM      Patient is a 76 year old male with significant coronary artery disease who underwent catheterization on 11/24/16 who is not considered a surgical revascularization candidate and they recommended medical therapy who presents to the emergency department today with burning chest pain and diaphoresis. Reports completely pain-free currently. Denies any assistive shortness of breath, nausea or vomiting. His EKG shows biphasic T waves and T-wave inversions in anterior leads appear similar compared to his prior EKG. I did discuss patient's case and EKG with cardiology fellow on call Dr. William Dalton who agrees that this is not a STEMI given patient chest pain-free and no significant change in his EKG. He is hemodynamically stable currently. Troponin is 1.25. Chest x-ray clear. He does have cardiomegaly which appears stable. We will start heparin drip. Patient has received aspirin. Cardiology to see patient for admission.   [place], DO 12/04/16 [phone]

## 2016-12-04 NOTE — ED Triage Notes (Signed)
Brought from home for c/o burning in center of chest.  Patient reports getting up to go to the bathroom and had sudden onset of burning in center of chest.  Recent hx of this.  Was seen at [facility] and transferred here.  Pt was in afib w/rvr and converted after having a bm.  Tonight when ems arrived rate was elevated showing uncontrolled afib.  Pt converted on his own a few minutes later.  After a few minutes changed to ST.  Again converted to SR and has been SR since.  Denies having any pain or sob at this time.

## 2016-12-04 NOTE — ED Notes (Addendum)
Admitting MD at the bedside.  

## 2016-12-04 NOTE — ED Notes (Signed)
CRITICAL VALUE ALERT  Critical value received: Troponin 1.67   Date of notification: 12/04/2016   Time of notification:  0708   Critical value read back: Yes  Nurse who received alert:  Delon Sacramento RN   MD notified (1st page): Ward  Time of first page:  0709  MD notified (2nd page):  Time of second page:  Responding MD:  Ward  Time MD responded:  [phone]

## 2016-12-04 NOTE — Progress Notes (Signed)
Ronald Robinson for heparin Indication: chest pain/ACS  No Known Allergies  Patient Measurements: Height: 5\' 11"  (180.3 cm) Weight: 178 lb 12.8 oz (81.1 kg) IBW/kg (Calculated) : 75.3  Vital Signs: Temp: 98.2 F (36.8 C) (02/24 1252) Temp Source: Oral (02/24 1252) BP: 145/76 (02/24 1610) Pulse Rate: 73 (02/24 1610)  Estimated Creatinine Clearance: 86.3 mL/min (by C-G formula based on SCr of 0.78 mg/dL).   Medical History: Past Medical History:  Diagnosis Date  . CAD in native artery  11/26/2016  . Enlarged prostate   . Hyperlipidemia LDL goal <70 12/04/2016  . Hypertension     Assessment: 76yo male c/o sudden onset of burning in center of chest, had been in Afib w/ RVR but spontaneously converted, istat troponin found to be elevated, to begin heparin.  Initial heparin level is therapeutic at 0.64. CBC stable.   Goal of Therapy:  Heparin level 0.3-0.7 units/ml Monitor platelets by anticoagulation protocol: Yes   Plan:  1. Continue heparin infusion at 1000 units/hr 2. Heparin level in am to serve as confirmatory  3. Daily heparin level and CBC   Vincenza Hews, PharmD, BCPS 12/04/2016, 7:23 PM

## 2016-12-04 NOTE — ED Notes (Signed)
Admitting Team at the bedside 

## 2016-12-04 NOTE — ED Provider Notes (Addendum)
[facility] DEPT Provider Note   CSN: [id number] Arrival date & time: 12/04/16  0543     History   Chief Complaint Chief Complaint  Patient presents with  . Irregular Heart Beat    HPI Ronald Robinson is a 76 y.o. male.  The history is provided by the patient. No language interpreter was used.  Chest Pain   This is a new problem. The current episode started less than 1 hour ago. The problem occurs constantly. The problem has been gradually worsening. The pain is associated with exertion. The pain is present in the substernal region. The quality of the pain is described as brief. The pain does not radiate. The symptoms are aggravated by certain positions. Associated symptoms include cough. Pertinent negatives include no abdominal pain, no malaise/fatigue and no nausea. He has tried nothing for the symptoms. The treatment provided mild relief. Risk factors include male gender.  His past medical history is significant for CAD.  Procedure history is positive for cardiac catheterization.  Pt reports he began having pain when he got up to go to the bathroom this am.  Pt reports pain improved and he is now pain free.    Past Medical History:  Diagnosis Date  . CAD in native artery  11/26/2016  . Enlarged prostate   . Hypertension     Patient Active Problem List   Diagnosis Date Noted  . CAD in native artery  11/26/2016  . S/P cardiac cath: Severe 3 vessel disease, not a candidate for CABG. Medical treatment 11/26/2016  . Non-ST elevation (NSTEMI) myocardial infarction ([place]) 11/24/2016  . Family history of coronary artery disease 11/24/2016  . PAF (paroxysmal atrial fibrillation) ([place]) 11/24/2016  . BPH (benign prostatic hyperplasia) 11/24/2016  . Essential hypertension 01/12/2011  . KNEE PAIN, LEFT, ACUTE 01/12/2011  . PES ANSERINUS TENDINITIS OR BURSITIS 01/12/2011  . DYSPNEA 01/12/2011    Past Surgical History:  Procedure Laterality Date  . HERNIA REPAIR    . LEFT HEART  CATH AND CORONARY ANGIOGRAPHY N/A 11/24/2016   Procedure: Left Heart Cath and Coronary Angiography;  Surgeon: Troy Sine, MD;  Location: [facility];  Service: Cardiovascular;  Laterality: N/A;       Home Medications    Prior to Admission medications   Medication Sig Start Date End Date Taking? Authorizing Provider  acetaminophen (TYLENOL) 325 MG tablet Take 2 tablets (650 mg total) by mouth every 4 (four) hours as needed for headache or mild pain. 11/26/16  Yes Tiffany Carlota Raspberry, PA-C  aspirin 81 MG tablet Take 81 mg by mouth daily.    Yes Historical Provider, MD  atorvastatin (LIPITOR) 80 MG tablet Take 1 tablet (80 mg total) by mouth daily at 6 PM. 11/26/16  Yes Tiffany Carlota Raspberry, PA-C  Cholecalciferol (VITAMIN D PO) Take 1 capsule by mouth daily.   Yes Historical Provider, MD  clopidogrel (PLAVIX) 75 MG tablet Take 1 tablet (75 mg total) by mouth daily. 11/27/16  Yes Tiffany Carlota Raspberry, PA-C  finasteride (PROSCAR) 5 MG tablet Take 5 mg by mouth daily.   Yes Historical Provider, MD  isosorbide mononitrate (IMDUR) 30 MG 24 hr tablet Take 1 tablet (30 mg total) by mouth daily. 11/27/16  Yes Tiffany Carlota Raspberry, PA-C  lisinopril (PRINIVIL,ZESTRIL) 10 MG tablet Take 1 tablet (10 mg total) by mouth daily. 10/29/12  Yes Noemi Chapel, MD  nitroGLYCERIN (NITROSTAT) 0.4 MG SL tablet Place 1 tablet (0.4 mg total) under the tongue every 5 (five) minutes x 3 doses as needed  for chest pain. 11/26/16  Yes Tiffany Carlota Raspberry, PA-C  tamsulosin (FLOMAX) 0.4 MG CAPS capsule Take 0.4 mg by mouth daily.    Yes Historical Provider, MD    Family History Family History  Problem Relation Age of Onset  . Coronary artery disease Father   . Coronary artery disease Sister   . Coronary artery disease Brother     Social History Social History  Substance Use Topics  . Smoking status: Never Smoker  . Smokeless tobacco: Never Used  . Alcohol use Yes     Comment: rare beer     Allergies   Patient has no known  allergies.   Review of Systems Review of Systems  Constitutional: Negative for malaise/fatigue.  Respiratory: Positive for cough.   Cardiovascular: Positive for chest pain.  Gastrointestinal: Negative for abdominal pain and nausea.  All other systems reviewed and are negative.    Physical Exam Updated Vital Signs BP 150/76   Pulse 64   Temp 98.4 F (36.9 C) (Oral)   Resp 20   Ht 5\' 7"  (1.702 m)   Wt 84.8 kg   SpO2 97%   BMI 29.29 kg/m   Physical Exam  Constitutional: He appears well-developed and well-nourished.  HENT:  Head: Normocephalic.  Right Ear: External ear normal.  Left Ear: External ear normal.  Mouth/Throat: Oropharynx is clear and moist.  Eyes: Conjunctivae and EOM are normal. Pupils are equal, round, and reactive to light.  Neck: Normal range of motion. Neck supple.  Cardiovascular: Normal rate and regular rhythm.   Pulmonary/Chest: Effort normal.  Abdominal: Soft.  Musculoskeletal: Normal range of motion.  Neurological: He is alert.  Skin: Skin is warm.  Psychiatric: He has a normal mood and affect.  Nursing note and vitals reviewed.    ED Treatments / Results  Labs (all labs ordered are listed, but only abnormal results are displayed) Labs Reviewed  BASIC METABOLIC PANEL - Abnormal; Notable for the following:       Result Value   CO2 21 (*)    Glucose, Bld 103 (*)    All other components within normal limits  TROPONIN I - Abnormal; Notable for the following:    Troponin I 1.67 (*)    All other components within normal limits  I-STAT TROPOININ, ED - Abnormal; Notable for the following:    Troponin i, poc 1.25 (*)    All other components within normal limits  CBC  HEPARIN LEVEL (UNFRACTIONATED)  I-STAT TROPOININ, ED    EKG  EKG Interpretation  Date/Time:  Saturday December 04 2016 06:02:35 EST Ventricular Rate:  72 PR Interval:    QRS Duration: 94 QT Interval:  348 QTC Calculation: 381 R Axis:   50 Text Interpretation:  Sinus  rhythm Borderline low voltage, extremity leads Abnormal T, consider ischemia, anterior leads Confirmed by WARD,  DO, KRISTEN [id number]) on 12/04/2016 6:05:10 AM       Radiology No results found.  Procedures .Critical Care Performed by: Fransico Meadow Authorized by: Fransico Meadow   Critical care provider statement:    Critical care time (minutes):  45   Critical care start time:  12/04/2016 6:08 AM   Critical care was time spent personally by me on the following activities:  Development of treatment plan with patient or surrogate, discussions with consultants, examination of patient, interpretation of cardiac output measurements, ordering and performing treatments and interventions, ordering and review of laboratory studies, ordering and review of radiographic studies, pulse oximetry, re-evaluation of patient's  condition and review of old charts Comments:     Reviewed old records, cardiac cath and cardiothoracic consults.  Pt started on heparin,  Will monitor carefully until admission     (including critical care time)  Medications Ordered in ED Medications  heparin bolus via infusion 4,000 Units (not administered)  heparin ADULT infusion 100 units/mL (25000 units/232mL sodium chloride 0.45%) (not administered)     Initial Impression / Assessment and Plan / ED Course  I have reviewed the triage vital signs and the nursing notes.  Pertinent labs & imaging results that were available during my care of the patient were reviewed by me and considered in my medical decision making (see chart for details).       Final Clinical Impressions(s) / ED Diagnoses   Final diagnoses:  NSTEMI (non-ST elevated myocardial infarction) [facility])    New Prescriptions New Prescriptions   No medications on file  Pt admitted to cardiology for further evaluation   Fransico Meadow, PA-C 12/04/16 [place], PA-C 12/04/16 [phone]

## 2016-12-04 NOTE — H&P (Signed)
Ronald Robinson is an 76 y.o. male.    Primary Cardiologist:Dr. Irish Lack PCP:  Annetta Maw, MD  Chief Complaint: chest burning HPI: 76 year old male with d/c 11/26/16 after admit for chest burning, NSTEMI with tropon 4.78.   Also with a fib in ER with conversion to SR  On that admit. Underwent cath with severe diffuse multivessel CAD of LAD, Ramus intermediate, LCX and RCA.   Pt discharged on ASA, plavix and imdur with plans to add ranexa if angina returns.   Since d/c he has had one other episode of chest burning and NTG relieved.  Last pm chest burning returned and pt took NTG with relief, then pain returned and NTG with relief, then returned and went to ER.  He was in a fib and converted to SR.     Today he is still with chest burning on my exam but resolved when Dr. Radford Pax saw.  Starting IV heparin and NTG.  No associated SOB did feel warm. No nausea.    Last admit we could not add BB due to bradycardia.  For PAF will add amiodarone 200 mg BID and monitor HR.    troponin   1.67   Lytes normal and CBC normal.  EKG SR with new T wave changes ant leads.    Past Medical History:  Diagnosis Date  . CAD in native artery  11/26/2016  . Enlarged prostate   . Hyperlipidemia LDL goal <70 12/04/2016  . Hypertension     Past Surgical History:  Procedure Laterality Date  . HERNIA REPAIR    . LEFT HEART CATH AND CORONARY ANGIOGRAPHY N/A 11/24/2016   Procedure: Left Heart Cath and Coronary Angiography;  Surgeon: Troy Sine, MD;  Location: [facility];  Service: Cardiovascular;  Laterality: N/A;    Family History  Problem Relation Age of Onset  . Coronary artery disease Father   . Coronary artery disease Sister   . Coronary artery disease Brother    Social History:  reports that he has never smoked. He has never used smokeless tobacco. He reports that he drinks alcohol. He reports that he does not use drugs.  Allergies: No Known Allergies   OUTPATIENT  MEDICATIONS: No current facility-administered medications on file prior to encounter.    Current Outpatient Prescriptions on File Prior to Encounter  Medication Sig Dispense Refill  . acetaminophen (TYLENOL) 325 MG tablet Take 2 tablets (650 mg total) by mouth every 4 (four) hours as needed for headache or mild pain. 30 tablet 2  . aspirin 81 MG tablet Take 81 mg by mouth daily.     Marland Kitchen atorvastatin (LIPITOR) 80 MG tablet Take 1 tablet (80 mg total) by mouth daily at 6 PM. 30 tablet 6  . Cholecalciferol (VITAMIN D PO) Take 1 capsule by mouth daily.    . clopidogrel (PLAVIX) 75 MG tablet Take 1 tablet (75 mg total) by mouth daily. 30 tablet 11  . finasteride (PROSCAR) 5 MG tablet Take 5 mg by mouth daily.    . isosorbide mononitrate (IMDUR) 30 MG 24 hr tablet Take 1 tablet (30 mg total) by mouth daily. 30 tablet 3  . lisinopril (PRINIVIL,ZESTRIL) 10 MG tablet Take 1 tablet (10 mg total) by mouth daily. 30 tablet 1  . nitroGLYCERIN (NITROSTAT) 0.4 MG SL tablet Place 1 tablet (0.4 mg total) under the tongue every 5 (five) minutes x 3 doses as needed for chest pain. 12 tablet 12  .  tamsulosin (FLOMAX) 0.4 MG CAPS capsule Take 0.4 mg by mouth daily.         Results for orders placed or performed during the hospital encounter of 12/04/16 (from the past 48 hour(s))  Basic metabolic panel     Status: Abnormal   Collection Time: 12/04/16  6:04 AM  Result Value Ref Range   Sodium 138 135 - 145 mmol/L   Potassium 4.1 3.5 - 5.1 mmol/L   Chloride 105 101 - 111 mmol/L   CO2 21 (L) 22 - 32 mmol/L   Glucose, Bld 103 (H) 65 - 99 mg/dL   BUN 18 6 - 20 mg/dL   Creatinine, Ser 0.78 0.61 - 1.24 mg/dL   Calcium 9.3 8.9 - 10.3 mg/dL   GFR calc non Af Amer >60 >60 mL/min   GFR calc Af Amer >60 >60 mL/min    Comment: (NOTE) The eGFR has been calculated using the CKD EPI equation. This calculation has not been validated in all clinical situations. eGFR's persistently <60 mL/min signify possible Chronic  Kidney Disease.    Anion gap 12 5 - 15  CBC     Status: None   Collection Time: 12/04/16  6:04 AM  Result Value Ref Range   WBC 5.0 4.0 - 10.5 K/uL   RBC 5.30 4.22 - 5.81 MIL/uL   Hemoglobin 14.4 13.0 - 17.0 g/dL   HCT 43.6 39.0 - 52.0 %   MCV 82.3 78.0 - 100.0 fL   MCH 27.2 26.0 - 34.0 pg   MCHC 33.0 30.0 - 36.0 g/dL   RDW 14.1 11.5 - 15.5 %   Platelets 248 150 - 400 K/uL  I-stat troponin, ED     Status: Abnormal   Collection Time: 12/04/16  6:04 AM  Result Value Ref Range   Troponin i, poc 1.25 (HH) 0.00 - 0.08 ng/mL   Comment NOTIFIED PHYSICIAN    Comment 3            Comment: Due to the release kinetics of cTnI, a negative result within the first hours of the onset of symptoms does not rule out myocardial infarction with certainty. If myocardial infarction is still suspected, repeat the test at appropriate intervals.   Troponin I     Status: Abnormal   Collection Time: 12/04/16  6:04 AM  Result Value Ref Range   Troponin I 1.67 (HH) <0.03 ng/mL    Comment: CRITICAL RESULT CALLED TO, READ BACK BY AND VERIFIED WITH: H.STEELE RN @ 0707 12/04/16 BY C.EDENS    Dg Chest 2 View  Result Date: 12/04/2016 CLINICAL DATA:  75-year-old male with chest pain. EXAM: CHEST  2 VIEW COMPARISON:  11/24/2016 and 01/12/2011 chest radiographs FINDINGS: Cardiomegaly again identified. There is no evidence of focal airspace disease, pulmonary edema, suspicious pulmonary nodule/mass, pleural effusion, or pneumothorax. No acute bony abnormalities are identified. IMPRESSION: Cardiomegaly without evidence of acute cardiopulmonary disease. Electronically Signed   By: Jeffrey  Hu M.D.   On: 12/04/2016 06:56    ROS: General:no colds or fevers, no weight changes Skin:no rashes or ulcers HEENT:no blurred vision, no congestion CV:see HPI PUL:see HPI GI:no diarrhea constipation or melena, no indigestion GU:no hematuria, no dysuria MS:no joint pain, no claudication Neuro:no syncope, no  lightheadedness Endo:no diabetes, no thyroid disease   Blood pressure 144/82, pulse 62, temperature 98.4 F (36.9 C), temperature source Oral, resp. rate (!) 27, height 5' 7" (1.702 m), weight 187 lb (84.8 kg), SpO2 97 %. PE: General:Pleasant affect, NAD Skin:Warm and dry,   brisk capillary refill HEENT:normocephalic, sclera clear, mucus membranes moist Neck:supple, no JVD, no bruits  Heart:S1S2 RRR without murmur, gallup, rub or click Lungs:clear without rales, rhonchi, or wheezes Abd:soft, non tender, + BS, do not palpate liver spleen or masses Ext:no lower ext edema, 2+ pedal pulses, 2+ radial pulses Neuro:alert and oriented, MAE, follows commands, + facial symmetry   Assessment/Plan Principal Problem:   Unstable angina (HCC)- NSTEMI - admit to step down,  serial troponins, IV Heparin to decide if cath for PCI as not candidate for CABG.  Is on plavix and ASA-  Dr. Varanasi and Dr. Turner have reviewed EKGS.    Active Problems:   Essential hypertension- continue meds    PAF (paroxysmal atrial fibrillation) (HCC)- recurrent will add amiodarone 200 BID and monitor HR, unable to use BB due to bradycardia, IV heparin and will need NOAC at discharge.    CAD in native artery  S/P cardiac cath: Severe 3 vessel disease, not a candidate for CABG. Medical treatment will add back Ranexa    NSTEMI (non-ST elevated myocardial infarction) (HCC)- troponin elevated may be resolving from last admit will do serial tgroponins.     Hyperlipidemia LDL goal <70 continue statin         Laura Ingold Nurse Practitioner Certified [facility] Pager [phone] or after 5pm or weekends call [phone] 12/04/2016, 8:41 AM   

## 2016-12-05 DIAGNOSIS — E785 Hyperlipidemia, unspecified: Secondary | ICD-10-CM

## 2016-12-05 DIAGNOSIS — I200 Unstable angina: Secondary | ICD-10-CM

## 2016-12-05 LAB — BASIC METABOLIC PANEL
Anion gap: 9 (ref 5–15)
BUN: 12 mg/dL (ref 6–20)
CHLORIDE: 102 mmol/L (ref 101–111)
CO2: 25 mmol/L (ref 22–32)
CREATININE: 0.78 mg/dL (ref 0.61–1.24)
Calcium: 9.3 mg/dL (ref 8.9–10.3)
GFR calc non Af Amer: >60 mL/min (ref >60)
Glucose, Bld: 99 mg/dL (ref 65–99)
POTASSIUM: 4 mmol/L (ref 3.5–5.1)
Sodium: 136 mmol/L (ref 135–145)

## 2016-12-05 LAB — CBC
HEMATOCRIT: 42.7 % (ref 39.0–52.0)
HEMOGLOBIN: 13.9 g/dL (ref 13.0–17.0)
MCH: 26.8 pg (ref 26.0–34.0)
MCHC: 32.6 g/dL (ref 30.0–36.0)
MCV: 82.4 fL (ref 78.0–100.0)
Platelets: 249 10*3/uL (ref 150–400)
RBC: 5.18 MIL/uL (ref 4.22–5.81)
RDW: 14.2 % (ref 11.5–15.5)
WBC: 5.3 10*3/uL (ref 4.0–10.5)

## 2016-12-05 LAB — TROPONIN I: Troponin I: 1.49 ng/mL (ref <0.03)

## 2016-12-05 LAB — HEPARIN LEVEL (UNFRACTIONATED): HEPARIN UNFRACTIONATED: 0.81 [IU]/mL — AB (ref 0.30–0.70)

## 2016-12-05 MED ORDER — APIXABAN 5 MG PO TABS
5.0000 mg | ORAL_TABLET | Freq: Two times a day (BID) | ORAL | Status: DC
  Administered 2016-12-05 – 2016-12-06 (×3): 5 mg via ORAL
  Filled 2016-12-05 (×3): qty 1

## 2016-12-05 MED ORDER — ISOSORBIDE MONONITRATE ER 30 MG PO TB24
30.0000 mg | ORAL_TABLET | Freq: Every day | ORAL | Status: DC
  Administered 2016-12-05 – 2016-12-06 (×2): 30 mg via ORAL
  Filled 2016-12-05 (×2): qty 1

## 2016-12-05 NOTE — Progress Notes (Signed)
ANTICOAGULATION CONSULT NOTE - Follow Up Consult  Pharmacy Consult for heparin Indication: NSTEMI/USAP and PAF  Labs:  Recent Labs  12/04/16 0604 12/04/16 1038 12/04/16 1806 12/04/16 2327 12/05/16 0532  HGB 14.4  --   --   --  13.9  HCT 43.6  --   --   --  42.7  PLT 248  --   --   --  249  LABPROT  --  14.8  --   --   --   INR  --  1.16  --   --   --   HEPARINUNFRC  --   --  0.64  --  0.81*  CREATININE 0.78  --   --   --   --   TROPONINI 1.67* 1.70* 1.60* 1.49*  --      Assessment: 75yo male now above goal on heparin after one level at goal.  Goal of Therapy:  Heparin level 0.3-0.7 units/ml   Plan:  Will decrease heparin gtt slightly to 900 units/hr and check level in [place], PharmD, BCPS  12/05/2016,6:30 AM

## 2016-12-05 NOTE — Discharge Instructions (Addendum)
Information on my medicine - ELIQUIS (apixaban)  This medication education was reviewed with me or my healthcare representative as part of my discharge preparation.  The pharmacist that spoke with me during my hospital stay was:  Carlean Jews, [facility]  Why was Eliquis prescribed for you? Eliquis was prescribed for you to reduce the risk of forming blood clots that can cause a stroke if you have a medical condition called atrial fibrillation (a type of irregular heartbeat) OR to reduce the risk of a blood clots forming after orthopedic surgery.  What do You need to know about Eliquis ? Take your Eliquis TWICE DAILY - one tablet in the morning and one tablet in the evening with or without food.  It would be best to take the doses about the same time each day.  If you have difficulty swallowing the tablet whole please discuss with your pharmacist how to take the medication safely.  Take Eliquis exactly as prescribed by your doctor and DO NOT stop taking Eliquis without talking to the doctor who prescribed the medication.  Stopping may increase your risk of developing a new clot or stroke.  Refill your prescription before you run out.  After discharge, you should have regular check-up appointments with your healthcare provider that is prescribing your Eliquis.  In the future your dose may need to be changed if your kidney function or weight changes by a significant amount or as you get older.  What do you do if you miss a dose? If you miss a dose, take it as soon as you remember on the same day and resume taking twice daily.  Do not take more than one dose of ELIQUIS at the same time.  Important Safety Information A possible side effect of Eliquis is bleeding. You should call your healthcare provider right away if you experience any of the following: ? Bleeding from an injury or your nose that does not stop. ? Unusual colored urine (red or dark brown) or unusual colored stools (red or  black). ? Unusual bruising for unknown reasons. ? A serious fall or if you hit your head (even if there is no bleeding).  Some medicines may interact with Eliquis and might increase your risk of bleeding or clotting while on Eliquis. To help avoid this, consult your healthcare provider or pharmacist prior to using any new prescription or non-prescription medications, including herbals, vitamins, non-steroidal anti-inflammatory drugs (NSAIDs) and supplements.  This website has more information on Eliquis (apixaban): [email or web address].no.   Radial Site Care Introduction Refer to this sheet in the next few weeks. These instructions provide you with information about caring for yourself after your procedure. Your health care provider may also give you more specific instructions. Your treatment has been planned according to current medical practices, but problems sometimes occur. Call your health care provider if you have any problems or questions after your procedure. What can I expect after the procedure? After your procedure, it is typical to have the following:  Bruising at the radial site that usually fades within 1-2 weeks.  Blood collecting in the tissue (hematoma) that may be painful to the touch. It should usually decrease in size and tenderness within 1-2 weeks. Follow these instructions at home:  Take medicines only as directed by your health care provider.  You may shower 24-48 hours after the procedure or as directed by your health care provider. Remove the bandage (dressing) and gently wash the site with plain soap and water. Pat the  area dry with a clean towel. Do not rub the site, because this may cause bleeding.  Do not take baths, swim, or use a hot tub until your health care provider approves.  Check your insertion site every day for redness, swelling, or drainage.  Do not apply powder or lotion to the site.  Do not flex or bend the affected arm for 24 hours or as directed  by your health care provider.  Do not push or pull heavy objects with the affected arm for 24 hours or as directed by your health care provider.  Do not lift over 10 lb (4.5 kg) for 5 days after your procedure or as directed by your health care provider.  Ask your health care provider when it is okay to:  Return to work or school.  Resume usual physical activities or sports.  Resume sexual activity.  Do not drive home if you are discharged the same day as the procedure. Have someone else drive you.  You may drive 24 hours after the procedure unless otherwise instructed by your health care provider.  Do not operate machinery or power tools for 24 hours after the procedure.  If your procedure was done as an outpatient procedure, which means that you went home the same day as your procedure, a responsible adult should be with you for the first 24 hours after you arrive home.  Keep all follow-up visits as directed by your health care provider. This is important. Contact a health care provider if:  You have a fever.  You have chills.  You have increased bleeding from the radial site. Hold pressure on the site. Get help right away if:  You have unusual pain at the radial site.  You have redness, warmth, or swelling at the radial site.  You have drainage (other than a small amount of blood on the dressing) from the radial site.  The radial site is bleeding, and the bleeding does not stop after 30 minutes of holding steady pressure on the site.  Your arm or hand becomes pale, cool, tingly, or numb. This information is not intended to replace advice given to you by your health care provider. Make sure you discuss any questions you have with your health care provider. Document Released: 10/30/2010 Document Revised: 03/04/2016 Document Reviewed: 04/15/2014  2017 Elsevier   Heart-Healthy Eating Plan Introduction Heart-healthy meal planning includes:  Limiting unhealthy  fats.  Increasing healthy fats.  Making other small dietary changes. You may need to talk with your doctor or a diet specialist (dietitian) to create an eating plan that is right for you. What types of fat should I choose?  Choose healthy fats. These include olive oil and canola oil, flaxseeds, walnuts, almonds, and seeds.  Eat more omega-3 fats. These include salmon, mackerel, sardines, tuna, flaxseed oil, and ground flaxseeds. Try to eat fish at least twice each week.  Limit saturated fats.  Saturated fats are often found in animal products, such as meats, butter, and cream.  Plant sources of saturated fats include palm oil, palm kernel oil, and coconut oil.  Avoid foods with partially hydrogenated oils in them. These include stick margarine, some tub margarines, cookies, crackers, and other baked goods. These contain trans fats. What general guidelines do I need to follow?  Check food labels carefully. Identify foods with trans fats or high amounts of saturated fat.  Fill one half of your plate with vegetables and green salads. Eat 4-5 servings of vegetables per day. A  serving of vegetables is:  1 cup of raw leafy vegetables.   cup of raw or cooked cut-up vegetables.   cup of vegetable juice.  Fill one fourth of your plate with whole grains. Look for the word "whole" as the first word in the ingredient list.  Fill one fourth of your plate with lean protein foods.  Eat 4-5 servings of fruit per day. A serving of fruit is:  One medium whole fruit.   cup of dried fruit.   cup of fresh, frozen, or canned fruit.   cup of 100% fruit juice.  Eat more foods that contain soluble fiber. These include apples, broccoli, carrots, beans, peas, and barley. Try to get 20-30 g of fiber per day.  Eat more home-cooked food. Eat less restaurant, buffet, and fast food.  Limit or avoid alcohol.  Limit foods high in starch and sugar.  Avoid fried foods.  Avoid frying your  food. Try baking, boiling, grilling, or broiling it instead. You can also reduce fat by:  Removing the skin from poultry.  Removing all visible fats from meats.  Skimming the fat off of stews, soups, and gravies before serving them.  Steaming vegetables in water or broth.  Lose weight if you are overweight.  Eat 4-5 servings of nuts, legumes, and seeds per week:  One serving of dried beans or legumes equals  cup after being cooked.  One serving of nuts equals 1 ounces.  One serving of seeds equals  ounce or one tablespoon.  You may need to keep track of how much salt or sodium you eat. This is especially true if you have high blood pressure. Talk with your doctor or dietitian to get more information. What foods can I eat? Grains  Breads, including [place], white, pita, wheat, raisin, rye, oatmeal, and [place]. Tortillas that are neither fried nor made with lard or trans fat. Low-fat rolls, including hotdog and hamburger buns and English muffins. Biscuits. Muffins. Waffles. Pancakes. Light popcorn. Whole-grain cereals. Flatbread. Melba toast. Pretzels. Breadsticks. Rusks. Low-fat snacks. Low-fat crackers, including oyster, saltine, matzo, graham, animal, and rye. Rice and pasta, including brown rice and pastas that are made with whole wheat. Vegetables  All vegetables. Fruits  All fruits, but limit coconut. Meats and Other Protein Sources  Lean, well-trimmed beef, veal, pork, and lamb. Chicken and [place] without skin. All fish and shellfish. Wild duck, rabbit, pheasant, and venison. Egg whites or low-cholesterol egg substitutes. Dried beans, peas, lentils, and tofu. Seeds and most nuts. Dairy  Low-fat or nonfat cheeses, including ricotta, string, and mozzarella. Skim or 1% milk that is liquid, powdered, or evaporated. Buttermilk that is made with low-fat milk. Nonfat or low-fat yogurt. Beverages  Mineral water. Diet carbonated beverages. Sweets and Desserts  Sherbets and fruit  ices. Honey, jam, marmalade, jelly, and syrups. Meringues and gelatins. Pure sugar candy, such as hard candy, jelly beans, gumdrops, mints, marshmallows, and small amounts of dark chocolate. [employer] Eat all sweets and desserts in moderation. Fats and Oils  Nonhydrogenated (trans-free) margarines. Vegetable oils, including soybean, sesame, sunflower, olive, peanut, safflower, corn, canola, and cottonseed. Salad dressings or mayonnaise made with a vegetable oil. Limit added fats and oils that you use for cooking, baking, salads, and as spreads. Other  Cocoa powder. Coffee and tea. All seasonings and condiments. The items listed above may not be a complete list of recommended foods or beverages. Contact your dietitian for more options.  What foods are not recommended? Grains  Breads that  are made with saturated or trans fats, oils, or whole milk. Croissants. Butter rolls. Cheese breads. Sweet rolls. Donuts. Buttered popcorn. Chow mein noodles. High-fat crackers, such as cheese or butter crackers. Meats and Other Protein Sources  Fatty meats, such as hotdogs, short ribs, sausage, spareribs, bacon, rib eye roast or steak, and mutton. High-fat deli meats, such as salami and bologna. Caviar. Domestic duck and goose. Organ meats, such as kidney, liver, sweetbreads, and heart. Dairy  Cream, sour cream, cream cheese, and creamed cottage cheese. Whole-milk cheeses, including blue (bleu), Monterey Jack, [place], [place], American, [place], Swiss, cheddar, [place], and [place]. Whole or 2% milk that is liquid, evaporated, or condensed. Whole buttermilk. Cream sauce or high-fat cheese sauce. Yogurt that is made from whole milk. Beverages  Regular sodas and juice drinks with added sugar. Sweets and Desserts  Frosting. Pudding. Cookies. Cakes other than angel food cake. Candy that has milk chocolate or white chocolate, hydrogenated fat, butter, coconut, or unknown ingredients. Buttered syrups. Full-fat ice  cream or ice cream drinks. Fats and Oils  Gravy that has suet, meat fat, or shortening. Cocoa butter, hydrogenated oils, palm oil, coconut oil, palm kernel oil. These can often be found in baked products, candy, fried foods, nondairy creamers, and whipped toppings. Solid fats and shortenings, including bacon fat, salt pork, lard, and butter. Nondairy cream substitutes, such as coffee creamers and sour cream substitutes. Salad dressings that are made of unknown oils, cheese, or sour cream. The items listed above may not be a complete list of foods and beverages to avoid. Contact your dietitian for more information.  This information is not intended to replace advice given to you by your health care provider. Make sure you discuss any questions you have with your health care provider. Document Released: 03/28/2012 Document Revised: 03/04/2016 Document Reviewed: 03/21/2014  2017 Elsevier

## 2016-12-05 NOTE — Progress Notes (Signed)
[place] for heparin >apixaban Indication:  atrial fibrillation  No Known Allergies  Patient Measurements: Height: 5\' 11"  (180.3 cm) Weight: 178 lb 1 oz (80.8 kg) IBW/kg (Calculated) : 75.3  Vital Signs: Temp: 97.9 F (36.6 C) (02/25 0744) Temp Source: Oral (02/25 0744) BP: 135/80 (02/25 0721) Pulse Rate: 54 (02/25 0721)  Estimated Creatinine Clearance: 86.3 mL/min (by C-G formula based on SCr of 0.78 mg/dL).   Medical History: Past Medical History:  Diagnosis Date  . CAD in native artery  11/26/2016  . Enlarged prostate   . Hyperlipidemia LDL goal <70 12/04/2016  . Hypertension     Assessment: 76yo male here with NSTEMI and pAF now with conversion to NSR. Transitioning from heparin to apixaban. SCr <1.5, Age <80, Wt >60 kg. CBC stable and wnl, no bleeding noted.    Goal of Therapy:  Monitor platelets by anticoagulation protocol: Yes   Plan:  - D/c heparin -start apixaban 5 mg PO BID -monitor for s/sx bleeding   Carlean Jews, Pharm.D. PGY1 Pharmacy Resident 2/25/20189:10 AM Pager [phone]

## 2016-12-05 NOTE — Progress Notes (Addendum)
Patient Name: Ronald Robinson Date of Encounter: 12/05/2016  Primary Cardiologist: Dr. Marinda Elk Problem List     Principal Problem:   Unstable angina [facility]) Active Problems:   Essential hypertension   PAF (paroxysmal atrial fibrillation) ([place])   CAD in native artery    S/P cardiac cath: Severe 3 vessel disease, not a candidate for CABG. Medical treatment   NSTEMI (non-ST elevated myocardial infarction) ([place])   Hyperlipidemia LDL goal <70     Subjective   No further chest pain  Inpatient Medications    Scheduled Meds: . amiodarone  200 mg Oral BID  . aspirin  324 mg Oral NOW   Or  . aspirin  300 mg Rectal NOW  . aspirin EC  81 mg Oral Daily  . atorvastatin  80 mg Oral q1800  . cholecalciferol  1,000 Units Oral Daily  . clopidogrel  75 mg Oral Daily  . finasteride  5 mg Oral Daily  . lisinopril  10 mg Oral Daily  . pantoprazole  40 mg Oral Daily  . ranolazine  500 mg Oral BID  . tamsulosin  0.4 mg Oral Daily   Continuous Infusions: . sodium chloride 10 mL/hr at 12/04/16 2254  . heparin 900 Units/hr (12/05/16 0631)  . nitroGLYCERIN 10 mcg/min (12/05/16 0600)   PRN Meds: acetaminophen, ALPRAZolam, alum & mag hydroxide-simeth, nitroGLYCERIN, ondansetron (ZOFRAN) IV, zolpidem   Vital Signs    Vitals:   12/05/16 0500 12/05/16 0600 12/05/16 0721 12/05/16 0744  BP:   135/80   Pulse: (!) 56 (!) 57 (!) 54   Resp: 15 20 (!) 21   Temp:    97.9 F (36.6 C)  TempSrc:    Oral  SpO2: 97% 98% 94%   Weight:      Height:        Intake/Output Summary (Last 24 hours) at 12/05/16 0820 Last data filed at 12/05/16 0731  Gross per 24 hour  Intake           639.22 ml  Output             1125 ml  Net          -485.78 ml   Filed Weights   12/04/16 0559 12/04/16 1252 12/05/16 0405  Weight: 187 lb (84.8 kg) 178 lb 12.8 oz (81.1 kg) 178 lb 1 oz (80.8 kg)    Physical Exam    GEN: Well nourished, well developed, in no acute distress.  HEENT: Grossly normal.    Neck: Supple, no JVD, carotid bruits, or masses. Cardiac: RRR, no murmurs, rubs, or gallops. No clubbing, cyanosis, edema.  Radials/DP/PT 2+ and equal bilaterally.  Respiratory:  Respirations regular and unlabored, clear to auscultation bilaterally. GI: Soft, nontender, nondistended, BS + x 4. MS: no deformity or atrophy. Skin: warm and dry, no rash. Neuro:  Strength and sensation are intact. Psych: AAOx3.  Normal affect.  Labs    CBC  Recent Labs  12/04/16 0604 12/05/16 0532  WBC 5.0 5.3  HGB 14.4 13.9  HCT 43.6 42.7  MCV 82.3 82.4  PLT 248 0000000   Basic Metabolic Panel  Recent Labs  12/04/16 0604 12/04/16 1038 12/05/16 0532  NA 138  --  136  K 4.1  --  4.0  CL 105  --  102  CO2 21*  --  25  GLUCOSE 103*  --  99  BUN 18  --  12  CREATININE 0.78  --  0.78  CALCIUM 9.3  --  9.3  MG  --  2.1  --    Liver Function Tests  Recent Labs  12/04/16 1038  AST 27  ALT 31  ALKPHOS 72  BILITOT 0.4  PROT 6.7  ALBUMIN 3.6   No results for input(s): LIPASE, AMYLASE in the last 72 hours. Cardiac Enzymes  Recent Labs  12/04/16 1038 12/04/16 1806 12/04/16 2327  TROPONINI 1.70* 1.60* 1.49*   BNP Invalid input(s): POCBNP D-Dimer No results for input(s): DDIMER in the last 72 hours. Hemoglobin A1C No results for input(s): HGBA1C in the last 72 hours. Fasting Lipid Panel No results for input(s): CHOL, HDL, LDLCALC, TRIG, CHOLHDL, LDLDIRECT in the last 72 hours. Thyroid Function Tests No results for input(s): TSH, T4TOTAL, T3FREE, THYROIDAB in the last 72 hours.  Invalid input(s): FREET3  Telemetry    NSR - Personally Reviewed  ECG    NSR - Personally Reviewed  Radiology    Dg Chest 2 View  Result Date: 12/04/2016 CLINICAL DATA:  76 year old male with chest pain. EXAM: CHEST  2 VIEW COMPARISON:  11/24/2016 and 01/12/2011 chest radiographs FINDINGS: Cardiomegaly again identified. There is no evidence of focal airspace disease, pulmonary edema, suspicious  pulmonary nodule/mass, pleural effusion, or pneumothorax. No acute bony abnormalities are identified. IMPRESSION: Cardiomegaly without evidence of acute cardiopulmonary disease. Electronically Signed   By: Margarette Canada M.D.   On: 12/04/2016 06:56    Cardiac Studies   Conclusion     Prox RCA to Mid RCA lesion, 70 %stenosed.  Post Atrio lesion, 90 %stenosed.  Ramus lesion, 80 %stenosed.  Prox LAD to Mid LAD lesion, 95 %stenosed.  Dist LAD-2 lesion, 95 %stenosed.  Dist LAD-1 lesion, 90 %stenosed.  Ost 1st Mrg lesion, 90 %stenosed.  Mid Cx lesion, 80 %stenosed.  Dist Cx lesion, 99 %stenosed.  Dist RCA lesion, 40 %stenosed.   Relatively preserved global LV contractility with an catenoid  appearing left ventricle.  LVEDP is normal.  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   RECOMMENDATION: Surgical consultation will be obtained.  However, the patient may have poor targets, particularly in the LAD for revascularization.  The patient apparently received a dose of Plavix 300 mg at [facility] before transferring to [facility] hospital; this will be discontinued for surgical evaluation.  Aggressive medical therapy will be implemented with beta blocker, nitrates, continuation of amlodipine and lisinopril, as well as the addition of ranolazine.  High potency statin therapy will be implemented.  If the patient is not felt to be an operative candidate, would recommend institution of dual platelet therapy.     Patient Profile     76 year old male with d/c 11/26/16 after admit for chest burning, NSTEMI with tropon 4.78.   Also with a fib in ER with conversion to SR  On that admit. Underwent cath with severe diffuse multivessel CAD of LAD, Ramus intermediate, LCX and RCA.   Pt discharged on ASA, plavix and imdur with plans to add ranexa if angina returns. Since d/c he has had one other episode of chest burning and NTG relieved.  Admitted again with chest burning  and pt took NTG with relief, then pain returned and NTG with relief, then returned and went to ER.  He was in a fib and converted to SR.      Assessment & Plan    Unstable angina ([place])- NSTEMI -  Deemed not a candidate for CABG.  Is on plavix and ASA.  Discussed  with Dr. Irish Lack and LAD would have to stented the entire vessel.  The apex was akinetic on LVgram. I am suspicious that his PAF may be triggering his angina.  Dr. Irish Lack recommends medical therapy at this time.   Will stop ASA since we are starting DOAC and stop Plavix.  Will d/c NTG and ambulate.  If no further CP will plan d/c home tomorrow am.  Continue Ranexa. Add long acting nitrate - Imdur 30mg  daily and titrate as needed.   Active Problems:   Essential hypertension- continue ACE I    PAF (paroxysmal atrial fibrillation) ([place])- recurrent and maintaining sinus bradycardia on amiodarone 200 BID.  Continue to monitor HR, unable to use BB due to bradycardia. Stop IV Heparin and start Eliquis 5mg  BID. CHADS2VASC score is 3.  EKG stable with QTc 445msec.  Will get baseline PFTs with DLCO.    CAD in native artery  S/P cardiac cath: Severe 3 vessel disease, not a candidate for CABG.  Added back Ranexa.  Likely medical treatment going forward.  Continue ASA and stop Plavix since starting DOAC.  Continue high dose statin and Ranexa.  No BB due to bradycardia.     NSTEMI (non-ST elevated myocardial infarction) ([place])- troponin elevated may be resolving from last admit will do serial tgroponins. Trop bumped from 1.67 to 1.7 and now trending downward at 1.49.  This may be withing lab error and seeing gradual decline in trop from recent NSTEMI.     Hyperlipidemia LDL goal <70 continue statin   Signed, Fransico Him, MD  12/05/2016, 8:20 AM

## 2016-12-06 ENCOUNTER — Telehealth: Payer: Self-pay | Admitting: Cardiology

## 2016-12-06 LAB — CBC
HCT: 40.8 % (ref 39.0–52.0)
Hemoglobin: 13.3 g/dL (ref 13.0–17.0)
MCH: 26.8 pg (ref 26.0–34.0)
MCHC: 32.6 g/dL (ref 30.0–36.0)
MCV: 82.3 fL (ref 78.0–100.0)
PLATELETS: 244 10*3/uL (ref 150–400)
RBC: 4.96 MIL/uL (ref 4.22–5.81)
RDW: 14.2 % (ref 11.5–15.5)
WBC: 5.2 10*3/uL (ref 4.0–10.5)

## 2016-12-06 MED ORDER — PANTOPRAZOLE SODIUM 40 MG PO TBEC: 40.0000 mg | DELAYED_RELEASE_TABLET | Freq: Every day | ORAL | 5 refills | Status: DC

## 2016-12-06 MED ORDER — ISOSORBIDE MONONITRATE ER 60 MG PO TB24: 60.0000 mg | ORAL_TABLET | Freq: Every day | ORAL | 5 refills | Status: DC

## 2016-12-06 MED ORDER — RANOLAZINE ER 500 MG PO TB12: 500.0000 mg | ORAL_TABLET | Freq: Two times a day (BID) | ORAL | 5 refills | Status: DC

## 2016-12-06 MED ORDER — APIXABAN 5 MG PO TABS: 5.0000 mg | ORAL_TABLET | Freq: Two times a day (BID) | ORAL | 11 refills | Status: DC

## 2016-12-06 MED ORDER — AMIODARONE HCL 200 MG PO TABS: 200.0000 mg | ORAL_TABLET | Freq: Two times a day (BID) | ORAL | 5 refills | Status: DC

## 2016-12-06 MED ORDER — ISOSORBIDE MONONITRATE ER 60 MG PO TB24: 60.0000 mg | ORAL_TABLET | Freq: Every day | ORAL | Status: DC

## 2016-12-06 NOTE — Care Management Note (Signed)
Case Management Note  Patient Details  Name: Ronald Robinson MRN: [id number] Date of Birth: 1941/02/22  Subjective/Objective:  Pt presented for Unstable Angina. Afib with RVR- plan for d/c home on Eliquis. Pt is without insurance.                   Action/Plan: CM will provide pt with 30 day free card. Patient Assistance form to be provided and MD to fill out. CM did make patient aware to call cardiology office for samples. No further needs from CM at this time.   Expected Discharge Date:  12/06/16               Expected Discharge Plan:  Home/Self Care  In-House Referral:  NA  Discharge planning Services  CM Consult, Medication Assistance  Post Acute Care Choice:  NA Choice offered to:  NA  DME Arranged:  N/A DME Agency:  NA  HH Arranged:  NA HH Agency:  NA  Status of Service:  Completed, signed off  If discussed at [place] of Stay Meetings, dates discussed:    Additional Comments:  Bethena Roys, RN 12/06/2016, 1:30 PM

## 2016-12-06 NOTE — Progress Notes (Signed)
Progress Note  Patient Name: Ronald Robinson Date of Encounter: 12/06/2016  Primary Cardiologist: Irish Lack  Subjective   No further chest pain  Inpatient Medications    Scheduled Meds: . amiodarone  200 mg Oral BID  . apixaban  5 mg Oral BID  . aspirin EC  81 mg Oral Daily  . atorvastatin  80 mg Oral q1800  . cholecalciferol  1,000 Units Oral Daily  . finasteride  5 mg Oral Daily  . isosorbide mononitrate  30 mg Oral Daily  . lisinopril  10 mg Oral Daily  . pantoprazole  40 mg Oral Daily  . ranolazine  500 mg Oral BID  . tamsulosin  0.4 mg Oral Daily   Continuous Infusions: . sodium chloride 10 mL/hr at 12/04/16 2254   PRN Meds: acetaminophen, ALPRAZolam, alum & mag hydroxide-simeth, nitroGLYCERIN, ondansetron (ZOFRAN) IV, zolpidem   Vital Signs    Vitals:   12/06/16 0500 12/06/16 0600 12/06/16 0700 12/06/16 1030  BP:   (!) 149/81 125/71  Pulse:      Resp: 18 (!) 27 (!) 25   Temp:   97.3 F (36.3 C)   TempSrc:   Axillary   SpO2:   98%   Weight:      Height:        Intake/Output Summary (Last 24 hours) at 12/06/16 1150 Last data filed at 12/06/16 1000  Gross per 24 hour  Intake                0 ml  Output              481 ml  Net             -481 ml   Filed Weights   12/05/16 0405 12/06/16 0139 12/06/16 0250  Weight: 178 lb 1 oz (80.8 kg) 178 lb (80.7 kg) 177 lb 11.2 oz (80.6 kg)    Telemetry    NSR, short bursts of PAT - Personally Reviewed  ECG    NSR, ant ST changes- Personally Reviewed  Physical Exam   GEN: No acute distress.   Neck: No JVD Cardiac: RRR, no murmurs, rubs, or gallops.  Respiratory: Clear to auscultation bilaterally. GI: Soft, nontender, non-distended  MS: No edema; No deformity. Neuro:  Nonfocal  Psych: Normal affect   Labs    Chemistry Recent Labs Lab 11/30/16 1529 12/04/16 0604 12/04/16 1038 12/05/16 0532  NA 140 138  --  136  K 4.2 4.1  --  4.0  CL 96 105  --  102  CO2 21 21*  --  25  GLUCOSE 92 103*   --  99  BUN 13 18  --  12  CREATININE 0.77 0.78  --  0.78  CALCIUM 9.5 9.3  --  9.3  PROT  --   --  6.7  --   ALBUMIN  --   --  3.6  --   AST  --   --  27  --   ALT  --   --  31  --   ALKPHOS  --   --  72  --   BILITOT  --   --  0.4  --   GFRNONAA 89 >60  --  >60  GFRAA 103 >60  --  >60  ANIONGAP  --  12  --  9     Hematology Recent Labs Lab 12/04/16 0604 12/05/16 0532 12/06/16 0322  WBC 5.0 5.3 5.2  RBC 5.30 5.18 4.96  HGB 14.4 13.9 13.3  HCT 43.6 42.7 40.8  MCV 82.3 82.4 82.3  MCH 27.2 26.8 26.8  MCHC 33.0 32.6 32.6  RDW 14.1 14.2 14.2  PLT 248 249 244    Cardiac Enzymes Recent Labs Lab 12/04/16 0604 12/04/16 1038 12/04/16 1806 12/04/16 2327  TROPONINI 1.67* 1.70* 1.60* 1.49*    Recent Labs Lab 12/04/16 0604  TROPIPOC 1.25*     BNPNo results for input(s): BNP, PROBNP in the last 168 hours.   DDimer No results for input(s): DDIMER in the last 168 hours.   Radiology    No results found.  Cardiac Studies   I personally reviewed the cath films.  Occluded LAD.  Severe disease in OM.  Moderate to severe disease in RCA.    Patient Profile     76 y.o. male with CAD, AFib, NSTEMI  Assessment & Plan    I suspect that the AFib with RVR triggered demand ischemia and chest pain.  Amio started.  Eliquis started.  Started on aggressive antianginal therapy as well.    Plan discharge today as he has been walking without problems.    Send out on current antianginal therapy with Imdur increased to 60 mg daily, amiodarone 400 mg daily and Eliquis.  Ranexa may be expensive.  WOuld try to titrate Imdur if there is trouble getting Imdur.  He wants a new PMD.  Could refer to Dr. Garnet Koyanagi as this will be close to his home.  Please set up an appt for him before discharge.  Signed, Larae Grooms, MD  12/06/2016, 11:50 AM

## 2016-12-06 NOTE — Telephone Encounter (Signed)
Ronald Simmons,PA called from the [facility] requesting samples of Eliquis 5 mg and Ranexa 500 mg tablets. Pt was given 2 weeks supply of both. Eliquis 5 mg Exp:3/20  LOT: [id number], Ranexa 500 mg  Exp: 4/20   LOT: [id number].

## 2016-12-06 NOTE — Progress Notes (Signed)
The patient has been given discharge instructions with a medication list. He has new medications to pick up from the pharmacy. Discharging with wife via car.   Saddie Benders RN

## 2016-12-06 NOTE — Discharge Summary (Signed)
Discharge Summary    Patient ID: Ronald Robinson,  MRN: [id number], DOB/AGE: 07/04/1941 76 y.o.  Admit date: 12/04/2016 Discharge date: 12/06/2016  Primary Care Provider: Annetta Maw Primary Cardiologist: Dr. Irish Lack   Discharge Diagnoses    Principal Problem:   Unstable angina [facility]) Active Problems:   Essential hypertension   PAF (paroxysmal atrial fibrillation) ([place])   CAD in native artery    S/P cardiac cath: Severe 3 vessel disease, not a candidate for CABG. Medical treatment   NSTEMI (non-ST elevated myocardial infarction) ([place])   Hyperlipidemia LDL goal <70   Allergies No Known Allergies  Diagnostic Studies/Procedures    None     History of Present Illness     76 y/o male with prior h/o HTN who was recently diagnosed with CAD and PAF. He initially presented to [facility] on 11/23/16 with a complaint of SSCP.  In the ED his EKG initially showed AF with CVR. He was unaware of palpitations or tachycardia. His Troponin was 1.34-2.13. His subsequent EKGs show subtle ST elevations and TWI. Pt was transferred to [facility] for cardiology admission. Patient had a cardiac cath using the right radial approach  which showed diffuse multivessel CAD involving the LAD, ramus intermediate, left circumflex, and RCA. EF normal at 55-60%. Pt not amenable to stent placement. CTS consulted and saw patient, they do not feel like CABG is the best treatment, his LAD system not graft able. The LCX and RCA systems are diffusely diseased and it is not possible to graft beyond the disease.  Dr. Cyndia Bent recommended medical therapy. He was placed on ASA, Plavix, Imdur and  Lisinopril. No BB given bradycardia. He ws discharged home on 11/30/16.  He presented back to [facility] on 12/04/16 with recurrent CP and recurrent PAF.   Hospital Course    In ED, CP improved with SL NTG. Initial troponin was 1.25. Cyclic troponins peaked at 1.70, then trended downward to 1.60>>1.49. His atrial fibrillation was treated  with PO amiodarone and he was placed on Eliquis for a/c. His Plavix was discontinued. ASA was resumed. His Imdur was increased to 60 mg daily and Ranexa added. His CP resolved and he ultimately converted back to NSR.  It was felt that his troponin elevation was likely secondary to demand ischemia from his rapid atrial fibrillation. There was indication for repeat cath.   He was last seen and examined by Dr. Irish Lack, who determined he was stable for d/c home. Plan is to continue antianginal therapy. Rx was written for Ranexa. If this is too expensive for the patient, we can further titrate his Imdur if recurrent angina. Pt also requested a new PCP. Dr. Irish Lack recommended Dr. Garnet Koyanagi. I contacted her office and placed new patient referral. Pt will need to contact office to arrange an appointment time. Post hospital f/u in our office will be arranged.   Consultants: none    Discharge Vitals Blood pressure 125/71, pulse (!) 56, temperature 97.3 F (36.3 C), temperature source Axillary, resp. rate (!) 25, height 5\' 11"  (1.803 m), weight 177 lb 11.2 oz (80.6 kg), SpO2 98 %.  Filed Weights   12/05/16 0405 12/06/16 0139 12/06/16 0250  Weight: 178 lb 1 oz (80.8 kg) 178 lb (80.7 kg) 177 lb 11.2 oz (80.6 kg)    Labs & Radiologic Studies    CBC  Recent Labs  12/05/16 0532 12/06/16 0322  WBC 5.3 5.2  HGB 13.9 13.3  HCT 42.7 40.8  MCV 82.4 82.3  PLT  249 [id number]   Basic Metabolic Panel  Recent Labs  12/04/16 0604 12/04/16 1038 12/05/16 0532  NA 138  --  136  K 4.1  --  4.0  CL 105  --  102  CO2 21*  --  25  GLUCOSE 103*  --  99  BUN 18  --  12  CREATININE 0.78  --  0.78  CALCIUM 9.3  --  9.3  MG  --  2.1  --    Liver Function Tests  Recent Labs  12/04/16 1038  AST 27  ALT 31  ALKPHOS 72  BILITOT 0.4  PROT 6.7  ALBUMIN 3.6   No results for input(s): LIPASE, AMYLASE in the last 72 hours. Cardiac Enzymes  Recent Labs  12/04/16 1038 12/04/16 1806 12/04/16 2327    TROPONINI 1.70* 1.60* 1.49*   BNP Invalid input(s): POCBNP D-Dimer No results for input(s): DDIMER in the last 72 hours. Hemoglobin A1C No results for input(s): HGBA1C in the last 72 hours. Fasting Lipid Panel No results for input(s): CHOL, HDL, LDLCALC, TRIG, CHOLHDL, LDLDIRECT in the last 72 hours. Thyroid Function Tests No results for input(s): TSH, T4TOTAL, T3FREE, THYROIDAB in the last 72 hours.  Invalid input(s): FREET3 _____________  Dg Chest 2 View  Result Date: 12/04/2016 CLINICAL DATA:  76 year old male with chest pain. EXAM: CHEST  2 VIEW COMPARISON:  11/24/2016 and 01/12/2011 chest radiographs FINDINGS: Cardiomegaly again identified. There is no evidence of focal airspace disease, pulmonary edema, suspicious pulmonary nodule/mass, pleural effusion, or pneumothorax. No acute bony abnormalities are identified. IMPRESSION: Cardiomegaly without evidence of acute cardiopulmonary disease. Electronically Signed   By: Margarette Canada M.D.   On: 12/04/2016 06:56   Dg Chest 2 View  Result Date: 11/24/2016 CLINICAL DATA:  Sharp midsternal chest pain. EXAM: CHEST  2 VIEW COMPARISON:  01/12/2011 CXR FINDINGS: Top normal size cardiac silhouette. Slight uncoiling of the thoracic aorta without aneurysm. There is aortic atherosclerosis. No pulmonary consolidation, effusion or pneumothorax. No overt pulmonary edema. Degenerative change along the dorsal spine. IMPRESSION: No active cardiopulmonary disease.  Aortic atherosclerosis. Electronically Signed   By: Ashley Royalty M.D.   On: 11/24/2016 00:50   Disposition   Pt is being discharged home today in good condition.  Follow-up Plans & Appointments    Follow-up Information    Ann Held, DO. Schedule an appointment as soon as possible for a visit.   Specialty:  Family Medicine Why:  call to make an appointment at [facility] to see a new primary care MD Contact information: [address] [place]  [address] [phone]        Larae Grooms, MD Follow up.   Specialties:  Cardiology, Radiology, Interventional Cardiology Why:  our office will call you to arrange a hospital follow-up appointment with Dr. Irish Lack or his PA/NP Contact information: [id number] [place] [address] [phone]          Discharge Instructions    Diet - low sodium heart healthy    Complete by:  As directed    Increase activity slowly    Complete by:  As directed       Discharge Medications   Current Discharge Medication List    START taking these medications   Details  amiodarone (PACERONE) 200 MG tablet Take 1 tablet (200 mg total) by mouth 2 (two) times daily. Qty: 60 tablet, Refills: 5    apixaban (ELIQUIS) 5 MG TABS tablet Take  1 tablet (5 mg total) by mouth 2 (two) times daily. Qty: 60 tablet, Refills: 11    pantoprazole (PROTONIX) 40 MG tablet Take 1 tablet (40 mg total) by mouth daily. Qty: 30 tablet, Refills: 5    ranolazine (RANEXA) 500 MG 12 hr tablet Take 1 tablet (500 mg total) by mouth 2 (two) times daily. Qty: 60 tablet, Refills: 5      CONTINUE these medications which have CHANGED   Details  isosorbide mononitrate (IMDUR) 60 MG 24 hr tablet Take 1 tablet (60 mg total) by mouth daily. Qty: 30 tablet, Refills: 5      CONTINUE these medications which have NOT CHANGED   Details  acetaminophen (TYLENOL) 325 MG tablet Take 2 tablets (650 mg total) by mouth every 4 (four) hours as needed for headache or mild pain. Qty: 30 tablet, Refills: 2    aspirin 81 MG tablet Take 81 mg by mouth daily.     atorvastatin (LIPITOR) 80 MG tablet Take 1 tablet (80 mg total) by mouth daily at 6 PM. Qty: 30 tablet, Refills: 6    Cholecalciferol (VITAMIN D PO) Take 1 capsule by mouth daily.    finasteride (PROSCAR) 5 MG tablet Take 5 mg by mouth daily.    lisinopril (PRINIVIL,ZESTRIL) 10 MG tablet Take 1 tablet (10 mg total) by mouth daily. Qty: 30 tablet,  Refills: 1    nitroGLYCERIN (NITROSTAT) 0.4 MG SL tablet Place 1 tablet (0.4 mg total) under the tongue every 5 (five) minutes x 3 doses as needed for chest pain. Qty: 12 tablet, Refills: 12    tamsulosin (FLOMAX) 0.4 MG CAPS capsule Take 0.4 mg by mouth daily.       STOP taking these medications     clopidogrel (PLAVIX) 75 MG tablet          Aspirin prescribed at discharge?  Yes High Intensity Statin Prescribed? (Lipitor 40-80mg  or Crestor 20-40mg ): Yes Beta Blocker Prescribed? No: bradycardia For EF <40%, was ACEI/ARB Prescribed? Yes ADP Receptor Inhibitor Prescribed? (i.e. Plavix etc.-Includes Medically Managed Patients): No: on Eliquis for PAF For EF <40%, Aldosterone Inhibitor Prescribed? No: EF >40% Was EF assessed during THIS hospitalization? No: assessed prior hospitalization Was Cardiac Rehab II ordered? (Included Medically managed Patients): Yes   Outstanding Labs/Studies   None   Duration of Discharge Encounter   Greater than 30 minutes including physician time.  Signed, Lyda Jester PA-C 12/06/2016, 1:00 PM   I have examined the patient and reviewed assessment and plan and discussed with patient.  Agree with above as stated.    I suspect that the AFib with RVR triggered demand ischemia and chest pain.  Amio started, 400 mg daily for now.  COuld decrease to 200 mg daily after a month.  Eliquis started.  Started on aggressive antianginal therapy as well.    Plan discharge today as he has been walking without problems.    Send out on current antianginal therapy with Imdur increased to 60 mg daily, amiodarone 400 mg daily and Eliquis.  Ranexa may be expensive.  WOuld try to titrate Imdur if there is trouble getting Imdur.  He wants a new PMD.  Will refer to Dr. Garnet Koyanagi as this office will be close to his home.    Larae Grooms

## 2016-12-09 ENCOUNTER — Inpatient Hospital Stay (HOSPITAL_COMMUNITY)
Admission: EM | Admit: 2016-12-09 | Discharge: 2016-12-11 | DRG: 123 | Disposition: A | Discharge status: Home / Self Care | Payer: Medicare Other | Attending: Internal Medicine | Admitting: Internal Medicine

## 2016-12-09 ENCOUNTER — Encounter (HOSPITAL_COMMUNITY): Payer: Self-pay | Admitting: Emergency Medicine

## 2016-12-09 ENCOUNTER — Telehealth: Payer: Self-pay | Admitting: Cardiology

## 2016-12-09 ENCOUNTER — Emergency Department (HOSPITAL_COMMUNITY): Payer: Medicare Other

## 2016-12-09 DIAGNOSIS — G450 Vertebro-basilar artery syndrome: Secondary | ICD-10-CM

## 2016-12-09 DIAGNOSIS — I2583 Coronary atherosclerosis due to lipid rich plaque: Secondary | ICD-10-CM | POA: Diagnosis present

## 2016-12-09 DIAGNOSIS — I10 Essential (primary) hypertension: Secondary | ICD-10-CM | POA: Diagnosis present

## 2016-12-09 DIAGNOSIS — R42 Dizziness and giddiness: Secondary | ICD-10-CM

## 2016-12-09 DIAGNOSIS — I480 Paroxysmal atrial fibrillation: Secondary | ICD-10-CM | POA: Diagnosis present

## 2016-12-09 DIAGNOSIS — Z7901 Long term (current) use of anticoagulants: Secondary | ICD-10-CM

## 2016-12-09 DIAGNOSIS — H53123 Transient visual loss, bilateral: Secondary | ICD-10-CM | POA: Diagnosis not present

## 2016-12-09 DIAGNOSIS — R0602 Shortness of breath: Secondary | ICD-10-CM

## 2016-12-09 DIAGNOSIS — E785 Hyperlipidemia, unspecified: Secondary | ICD-10-CM | POA: Diagnosis present

## 2016-12-09 DIAGNOSIS — I252 Old myocardial infarction: Secondary | ICD-10-CM

## 2016-12-09 DIAGNOSIS — Z79899 Other long term (current) drug therapy: Secondary | ICD-10-CM

## 2016-12-09 DIAGNOSIS — H543 Unqualified visual loss, both eyes: Secondary | ICD-10-CM

## 2016-12-09 DIAGNOSIS — Z7982 Long term (current) use of aspirin: Secondary | ICD-10-CM

## 2016-12-09 DIAGNOSIS — N400 Enlarged prostate without lower urinary tract symptoms: Secondary | ICD-10-CM | POA: Diagnosis present

## 2016-12-09 DIAGNOSIS — Z8249 Family history of ischemic heart disease and other diseases of the circulatory system: Secondary | ICD-10-CM

## 2016-12-09 DIAGNOSIS — I2510 Atherosclerotic heart disease of native coronary artery without angina pectoris: Secondary | ICD-10-CM | POA: Diagnosis present

## 2016-12-09 NOTE — ED Notes (Signed)
Ward, MD at bedside. 

## 2016-12-09 NOTE — ED Provider Notes (Signed)
[facility] DEPT Provider Note   CSN: [id number] Arrival date & time: 12/09/16  2257     History   Chief Complaint Chief Complaint  Patient presents with  . Shortness of Breath    HPI Ronald Robinson is a 76 y.o. 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 Patient states he was sitting watching television when he began having a pain in his left flank. Shortly after he was drifting off to sleep and became short of breath. Patient opened his eyes and could not see. He states he saw brown. This lasted around 5-6 minutes and resolved. He also reports diaphoresis during this time, as well. Patient is asymptomatic at this time. He denies any chest pain, shortness of breath, abdominal pain, nausea, vomiting, vision symptoms, headache, lightheadedness, dizziness, weakness, numbness or tingling.  HPI  Past Medical History:  Diagnosis Date  . CAD in native artery  11/26/2016  . Enlarged prostate   . Hyperlipidemia LDL goal <70 12/04/2016  . Hypertension     Patient Active Problem List   Diagnosis Date Noted  . Dizziness 12/10/2016  . Unstable angina ([place]) 12/04/2016  . Hyperlipidemia LDL goal <70 12/04/2016  . NSTEMI (non-ST elevated myocardial infarction) ([place])   . CAD in native artery  11/26/2016  . S/P cardiac cath: Severe 3 vessel disease, not a candidate for CABG. Medical treatment 11/26/2016  . Non-ST elevation (NSTEMI) myocardial infarction ([place]) 11/24/2016  . Family history of coronary artery disease 11/24/2016  . PAF (paroxysmal atrial fibrillation) ([place]) 11/24/2016  . BPH (benign prostatic hyperplasia) 11/24/2016  . Essential hypertension 01/12/2011  . KNEE PAIN, LEFT, ACUTE 01/12/2011  . PES ANSERINUS TENDINITIS OR BURSITIS 01/12/2011  . DYSPNEA 01/12/2011    Past Surgical History:    Procedure Laterality Date  . HERNIA REPAIR    . LEFT HEART CATH AND CORONARY ANGIOGRAPHY N/A 11/24/2016   Procedure: Left Heart Cath and Coronary Angiography;  Surgeon: Troy Sine, MD;  Location: [facility];  Service: Cardiovascular;  Laterality: N/A;       Home Medications    Prior to Admission medications   Medication Sig Start Date End Date Taking? Authorizing Provider  acetaminophen (TYLENOL) 325 MG tablet Take 2 tablets (650 mg total) by mouth every 4 (four) hours as needed for headache or mild pain. 11/26/16   Tiffany Carlota Raspberry, PA-C  amiodarone (PACERONE) 200 MG tablet Take 1 tablet (200 mg total) by mouth 2 (two) times daily. 12/06/16   Brittainy Erie Noe, PA-C  apixaban (ELIQUIS) 5 MG TABS tablet Take 1 tablet (5 mg total) by mouth 2 (two) times daily. 12/06/16   Brittainy Erie Noe, PA-C  aspirin 81 MG tablet Take 81 mg by mouth daily.     Historical Provider, MD  atorvastatin (LIPITOR) 80 MG tablet Take 1 tablet (80 mg total) by mouth daily at 6 PM. 11/26/16   Delos Haring, PA-C  Cholecalciferol (VITAMIN D PO) Take 1 capsule by mouth daily.    Historical Provider, MD  finasteride (PROSCAR) 5 MG tablet Take 5 mg by mouth daily.    Historical Provider, MD  isosorbide mononitrate (IMDUR) 60 MG 24 hr tablet Take 1 tablet (60 mg total) by mouth daily. 12/06/16   Brittainy Erie Noe, PA-C  lisinopril (PRINIVIL,ZESTRIL) 10 MG tablet Take 1 tablet (10 mg total) by mouth daily. 10/29/12  Noemi Chapel, MD  nitroGLYCERIN (NITROSTAT) 0.4 MG SL tablet Place 1 tablet (0.4 mg total) under the tongue every 5 (five) minutes x 3 doses as needed for chest pain. 11/26/16   Tiffany Carlota Raspberry, PA-C  pantoprazole (PROTONIX) 40 MG tablet Take 1 tablet (40 mg total) by mouth daily. 12/07/16   Brittainy Erie Noe, PA-C  ranolazine (RANEXA) 500 MG 12 hr tablet Take 1 tablet (500 mg total) by mouth 2 (two) times daily. 12/06/16   Brittainy Erie Noe, PA-C  tamsulosin (FLOMAX) 0.4 MG CAPS capsule Take 0.4  mg by mouth daily.     Historical Provider, MD    Family History Family History  Problem Relation Age of Onset  . Coronary artery disease Father   . Coronary artery disease Sister   . Coronary artery disease Brother     Social History Social History  Substance Use Topics  . Smoking status: Never Smoker  . Smokeless tobacco: Never Used  . Alcohol use Yes     Comment: rare beer     Allergies   Patient has no known allergies.   Review of Systems Review of Systems  Constitutional: Negative for chills and fever.  HENT: Negative for facial swelling and sore throat.   Eyes: Positive for visual disturbance (resolved).  Respiratory: Positive for shortness of breath (resolved).   Cardiovascular: Negative for chest pain.  Gastrointestinal: Positive for abdominal pain (resolved). Negative for nausea and vomiting.  Genitourinary: Negative for dysuria.  Musculoskeletal: Negative for back pain.  Skin: Negative for rash and wound.  Neurological: Negative for headaches.  Psychiatric/Behavioral: The patient is not nervous/anxious.      Physical Exam Updated Vital Signs BP 135/67   Pulse 69   Temp 97.6 F (36.4 C) (Oral)   Resp 18   SpO2 98%   Physical Exam  Constitutional: He appears well-developed and well-nourished. No distress.  HENT:  Head: Normocephalic and atraumatic.  Mouth/Throat: Oropharynx is clear and moist. No oropharyngeal exudate.  Eyes: Conjunctivae and EOM are normal. Pupils are equal, round, and reactive to light. Right eye exhibits no discharge. Left eye exhibits no discharge. No scleral icterus.  Neck: Normal range of motion. Neck supple. No thyromegaly present.  Cardiovascular: Normal rate, regular rhythm, normal heart sounds and intact distal pulses.  Exam reveals no gallop and no friction rub.   No murmur heard. Pulmonary/Chest: Effort normal. No stridor. No respiratory distress. He has wheezes (expiratory wheeze mid left lung). He has no rales.   Abdominal: Soft. Bowel sounds are normal. He exhibits no distension. There is no tenderness. There is no rebound, no guarding and no CVA tenderness.  Musculoskeletal: He exhibits no edema.  Lymphadenopathy:    He has no cervical adenopathy.  Neurological: He is alert. Coordination normal.  CN 3-12 intact; normal sensation throughout; 5/5 strength in all 4 extremities; equal bilateral grip strength  Skin: Skin is warm and dry. No rash noted. He is not diaphoretic. No pallor.  Psychiatric: He has a normal mood and affect.  Nursing note and vitals reviewed.    ED Treatments / Results  Labs (all labs ordered are listed, but only abnormal results are displayed) Labs Reviewed  COMPREHENSIVE METABOLIC PANEL - Abnormal; Notable for the following:       Result Value   Glucose, Bld 111 (*)    All other components within normal limits  I-STAT TROPOININ, ED - Abnormal; Notable for the following:    Troponin i, poc 0.38 (*)    All other  components within normal limits  CBC WITH DIFFERENTIAL/PLATELET  LIPASE, BLOOD  URINALYSIS, ROUTINE W REFLEX MICROSCOPIC    EKG  EKG Interpretation  Date/Time:  Thursday December 09 2016 23:18:25 EST Ventricular Rate:  62 PR Interval:    QRS Duration: 91 QT Interval:  379 QTC Calculation: 385 R Axis:   1 Text Interpretation:  Sinus rhythm Left atrial enlargement Low voltage, precordial leads T wave abnormality in anterolateral leads has improved Confirmed by WARD,  DO, KRISTEN [phone]) on 12/10/2016 1:05:15 AM       Radiology Dg Chest 2 View  Result Date: 12/10/2016 CLINICAL DATA:  Shortness of breath while resting. Intermittent LEFT flank pain. History of hypertension. EXAM: CHEST  2 VIEW COMPARISON:  Chest radiograph December 04, 2016 FINDINGS: Cardiac silhouette is mildly enlarged unchanged. Mediastinal silhouette is nonsuspicious, mildly calcified aortic knob. No pleural effusion or focal consolidation. No pneumothorax. Persistent mildly elevated LEFT  hemidiaphragm. No pneumothorax. Mild broad dextroscoliosis and degenerative change of thoracic spine. IMPRESSION: Stable examination:  Mild cardiomegaly, no acute pulmonary process. Electronically Signed   By: Elon Alas M.D.   On: 12/10/2016 00:12   Ct Head Wo Contrast  Result Date: 12/09/2016 CLINICAL DATA:  Shortness of breath at rest, vision loss when standing. History of hypertension, atrial fibrillation. EXAM: CT HEAD WITHOUT CONTRAST TECHNIQUE: Contiguous axial images were obtained from the base of the skull through the vertex without intravenous contrast. COMPARISON:  None. FINDINGS: BRAIN: The ventricles and sulci are normal for age. No intraparenchymal hemorrhage, mass effect nor midline shift. Patchy supratentorial white matter hypodensities less than expected patient's age, though non-specific are most compatible with chronic small vessel ischemic disease. No acute large vascular territory infarcts. No abnormal extra-axial fluid collections. Basal cisterns are patent. VASCULAR: Mild calcific atherosclerosis of the carotid siphons. SKULL: No skull fracture. No significant scalp soft tissue swelling. SINUSES/ORBITS: Trace paranasal sinus mucosal thickening without air-fluid levels. Mastoid air cells are well aerated. Old LEFT medial orbital blowout fracture, ocular globes and orbital contents are otherwise unremarkable. OTHER: None. IMPRESSION: Negative CT HEAD for age. Electronically Signed   By: Elon Alas M.D.   On: 12/09/2016 23:56    Procedures Procedures (including critical care time)  Medications Ordered in ED Medications - No data to display   Initial Impression / Assessment and Plan / ED Course  I have reviewed the triage vital signs and the nursing notes.  Pertinent labs & imaging results that were available during my care of the patient were reviewed by me and considered in my medical decision making (see chart for details).     CBC, CMP, lipase WNL. Troponin  0.38, this is trending downward from elevation 1 week ago. CT head negative for age. CXR shows mild cardiomegaly, no acute pulmonary process. Concern for TIA considering transient vision loss. Will request admission for TIA workup. I consulted Dr. Loleta Books who will admit the patient for further evaluation and treatment. Patient also evaluated by Dr. Leonides Schanz who guided the patient's management and agrees with plan.  Final Clinical Impressions(s) / ED Diagnoses   Final diagnoses:  SOB (shortness of breath)  Vision loss, bilateral    New Prescriptions New Prescriptions   No medications on file        Frederica Kuster, PA-C 12/10/16 0105

## 2016-12-09 NOTE — Telephone Encounter (Signed)
Walk In pt Form-[employer] form dropped off placed in [employer]

## 2016-12-09 NOTE — ED Triage Notes (Signed)
Pt arrived to Ed via EMS. C/o SOB while resting. Pt states that when he tried to get up he could not see. Does not report chest pain but reports pain in left flank area. Pain is intermittent. Has extensive heart history with heart blockage and hx of Afib. D/c from hospital on Friday. Received fluids from EMS. Not currently experiencing CP or SOB.

## 2016-12-10 ENCOUNTER — Observation Stay (HOSPITAL_COMMUNITY): Payer: Medicare Other

## 2016-12-10 ENCOUNTER — Emergency Department (HOSPITAL_COMMUNITY): Payer: Medicare Other

## 2016-12-10 ENCOUNTER — Encounter (HOSPITAL_COMMUNITY): Payer: Self-pay | Admitting: Family Medicine

## 2016-12-10 ENCOUNTER — Ambulatory Visit: Payer: Medicare Other | Admitting: Cardiology

## 2016-12-10 DIAGNOSIS — I480 Paroxysmal atrial fibrillation: Secondary | ICD-10-CM | POA: Diagnosis present

## 2016-12-10 DIAGNOSIS — E785 Hyperlipidemia, unspecified: Secondary | ICD-10-CM | POA: Diagnosis present

## 2016-12-10 DIAGNOSIS — I252 Old myocardial infarction: Secondary | ICD-10-CM | POA: Diagnosis not present

## 2016-12-10 DIAGNOSIS — I2583 Coronary atherosclerosis due to lipid rich plaque: Secondary | ICD-10-CM | POA: Diagnosis present

## 2016-12-10 DIAGNOSIS — H53123 Transient visual loss, bilateral: Secondary | ICD-10-CM | POA: Diagnosis present

## 2016-12-10 DIAGNOSIS — H543 Unqualified visual loss, both eyes: Secondary | ICD-10-CM | POA: Diagnosis present

## 2016-12-10 DIAGNOSIS — Z79899 Other long term (current) drug therapy: Secondary | ICD-10-CM | POA: Diagnosis not present

## 2016-12-10 DIAGNOSIS — R42 Dizziness and giddiness: Secondary | ICD-10-CM

## 2016-12-10 DIAGNOSIS — Z7901 Long term (current) use of anticoagulants: Secondary | ICD-10-CM | POA: Diagnosis not present

## 2016-12-10 DIAGNOSIS — Z7982 Long term (current) use of aspirin: Secondary | ICD-10-CM | POA: Diagnosis not present

## 2016-12-10 DIAGNOSIS — I2510 Atherosclerotic heart disease of native coronary artery without angina pectoris: Secondary | ICD-10-CM | POA: Diagnosis present

## 2016-12-10 DIAGNOSIS — Z8249 Family history of ischemic heart disease and other diseases of the circulatory system: Secondary | ICD-10-CM | POA: Diagnosis not present

## 2016-12-10 DIAGNOSIS — I10 Essential (primary) hypertension: Secondary | ICD-10-CM | POA: Diagnosis present

## 2016-12-10 DIAGNOSIS — N400 Enlarged prostate without lower urinary tract symptoms: Secondary | ICD-10-CM | POA: Diagnosis present

## 2016-12-10 LAB — LIPASE, BLOOD: Lipase: 16 U/L (ref 11–51)

## 2016-12-10 LAB — URINALYSIS, ROUTINE W REFLEX MICROSCOPIC
BACTERIA UA: NONE SEEN
BILIRUBIN URINE: NEGATIVE
Glucose, UA: NEGATIVE mg/dL
Hgb urine dipstick: NEGATIVE
KETONES UR: 20 mg/dL — AB
Leukocytes, UA: NEGATIVE
Nitrite: NEGATIVE
PH: 5 (ref 5.0–8.0)
Protein, ur: 100 mg/dL — AB
Specific Gravity, Urine: 1.025 (ref 1.005–1.030)

## 2016-12-10 LAB — CBC WITH DIFFERENTIAL/PLATELET
Basophils Absolute: 0 10*3/uL (ref 0.0–0.1)
Basophils Relative: 0 %
Eosinophils Absolute: 0.1 10*3/uL (ref 0.0–0.7)
Eosinophils Relative: 2 %
HCT: 45.7 % (ref 39.0–52.0)
Hemoglobin: 15.4 g/dL (ref 13.0–17.0)
Lymphocytes Relative: 25 %
Lymphs Abs: 1.3 10*3/uL (ref 0.7–4.0)
MCH: 27.5 pg (ref 26.0–34.0)
MCHC: 33.7 g/dL (ref 30.0–36.0)
MCV: 81.6 fL (ref 78.0–100.0)
Monocytes Absolute: 0.4 10*3/uL (ref 0.1–1.0)
Monocytes Relative: 7 %
Neutro Abs: 3.4 10*3/uL (ref 1.7–7.7)
Neutrophils Relative %: 66 %
Platelets: 222 10*3/uL (ref 150–400)
RBC: 5.6 MIL/uL (ref 4.22–5.81)
RDW: 14.4 % (ref 11.5–15.5)
WBC: 5.2 10*3/uL (ref 4.0–10.5)

## 2016-12-10 LAB — COMPREHENSIVE METABOLIC PANEL
ALT: 22 U/L (ref 17–63)
AST: 18 U/L (ref 15–41)
Albumin: 3.9 g/dL (ref 3.5–5.0)
Alkaline Phosphatase: 67 U/L (ref 38–126)
Anion gap: 12 (ref 5–15)
BUN: 16 mg/dL (ref 6–20)
CO2: 24 mmol/L (ref 22–32)
Calcium: 10.1 mg/dL (ref 8.9–10.3)
Chloride: 101 mmol/L (ref 101–111)
Creatinine, Ser: 1.05 mg/dL (ref 0.61–1.24)
GFR calc Af Amer: >60 mL/min (ref >60)
GFR calc non Af Amer: >60 mL/min (ref >60)
Glucose, Bld: 111 mg/dL — ABNORMAL HIGH (ref 65–99)
Potassium: 4 mmol/L (ref 3.5–5.1)
Sodium: 137 mmol/L (ref 135–145)
Total Bilirubin: 0.5 mg/dL (ref 0.3–1.2)
Total Protein: 6.7 g/dL (ref 6.5–8.1)

## 2016-12-10 LAB — I-STAT TROPONIN, ED: Troponin i, poc: 0.38 ng/mL (ref 0.00–0.08)

## 2016-12-10 MED ORDER — SODIUM CHLORIDE 0.9 % IV SOLN
INTRAVENOUS | Status: AC
  Administered 2016-12-10: 02:00:00 via INTRAVENOUS

## 2016-12-10 MED ORDER — TAMSULOSIN HCL 0.4 MG PO CAPS
0.4000 mg | ORAL_CAPSULE | Freq: Every day | ORAL | Status: DC
  Administered 2016-12-10 – 2016-12-11 (×2): 0.4 mg via ORAL
  Filled 2016-12-10 (×2): qty 1

## 2016-12-10 MED ORDER — PANTOPRAZOLE SODIUM 40 MG PO TBEC
40.0000 mg | DELAYED_RELEASE_TABLET | Freq: Every day | ORAL | Status: DC
  Administered 2016-12-10 – 2016-12-11 (×2): 40 mg via ORAL
  Filled 2016-12-10 (×2): qty 1

## 2016-12-10 MED ORDER — APIXABAN 5 MG PO TABS
5.0000 mg | ORAL_TABLET | Freq: Two times a day (BID) | ORAL | Status: DC
  Administered 2016-12-10 – 2016-12-11 (×3): 5 mg via ORAL
  Filled 2016-12-10 (×3): qty 1

## 2016-12-10 MED ORDER — IOPAMIDOL (ISOVUE-370) INJECTION 76%
INTRAVENOUS | Status: AC
  Administered 2016-12-10: 08:00:00
  Filled 2016-12-10: qty 50

## 2016-12-10 MED ORDER — SODIUM CHLORIDE 0.9% FLUSH
3.0000 mL | Freq: Two times a day (BID) | INTRAVENOUS | Status: DC
  Administered 2016-12-10 (×2): 3 mL via INTRAVENOUS

## 2016-12-10 MED ORDER — ASPIRIN EC 81 MG PO TBEC
81.0000 mg | DELAYED_RELEASE_TABLET | Freq: Every day | ORAL | Status: DC
  Administered 2016-12-10 – 2016-12-11 (×2): 81 mg via ORAL
  Filled 2016-12-10 (×2): qty 1

## 2016-12-10 MED ORDER — FINASTERIDE 5 MG PO TABS
5.0000 mg | ORAL_TABLET | Freq: Every day | ORAL | Status: DC
  Administered 2016-12-10 – 2016-12-11 (×2): 5 mg via ORAL
  Filled 2016-12-10 (×2): qty 1

## 2016-12-10 MED ORDER — AMIODARONE HCL 200 MG PO TABS
200.0000 mg | ORAL_TABLET | Freq: Two times a day (BID) | ORAL | Status: DC
  Administered 2016-12-10 – 2016-12-11 (×3): 200 mg via ORAL
  Filled 2016-12-10 (×3): qty 1

## 2016-12-10 MED ORDER — ATORVASTATIN CALCIUM 80 MG PO TABS
80.0000 mg | ORAL_TABLET | Freq: Every day | ORAL | Status: DC
  Administered 2016-12-10: 80 mg via ORAL
  Filled 2016-12-10 (×2): qty 1

## 2016-12-10 NOTE — ED Notes (Signed)
Pt taken to CT.

## 2016-12-10 NOTE — H&P (Addendum)
History and Physical  Patient Name: Ronald Robinson     [id number]    DOB: 1941/02/08    DOA: 12/09/2016 PCP: Annetta Maw, MD   Patient coming from: Home  Chief Complaint: Vision change and imbalance  HPI: Ronald Robinson is a 76 y.o. male with a past medical history significant for HTN and CAD who presents with acute vision change and imbalane.  The patient was just admitted on 2/14/178 to the Cardiology service for NSTEMI.  Underwent urgent catheterization that showed diffuse disease, was evaluated by CT surgery felt not to be an operative candidate either.  Was started on aggressive DAPT, BB, ntirates, and ranolazine in addition to his amlodipine and lisinopril.  Was readmitted on 2/24 with chest pain, but this time new Afib in RVR.  Amiodarone was added and Plavix was changed to apixaban and he was discharged after two days.  He has been home now 1 week.  No further chest pains, no SOB, no new exertional symptoms, doing okay.  No new dizziness with standing, no fatigue, no orthostasis, no weakness, no lightheadedness with standing.  Tonight, he was watching television and dozed off.  When he woke, he couldn't see anything at all, it was as if his vision was "all brown".  He called his wife, who told him he was "falling over out of my chair" and so she called 9-1-1.  There was no vertigo.  There were no palpitations or heart racing.  There was no LOC, no speech disturbance, focal weakness, numbness, facial asymmetry, slurred speech or anything. He has noticed concentrated urine tonight. The whole episode lasted 5 minutes and had resolved before EMS arrived.    ED course: -Afebrile, heart rate 71, respirations and pulse ox normal, BP 115/66 -Na 137, K 4.0, Cr 1.05 (baseline 0.8), WBC 5.2K, Hgb 15.4 -Lipase normal -Troponin 0.38 and trending down -ECG showed sinus rhythm -TRH were asked to evaluate for TIA, presumably meaning vertebrobasilar insufficiency symptoms     ROS: Review of  Systems  Eyes: Positive for blurred vision (not "blurred" but brown and dark, without lightheadedness).  Neurological: Negative for dizziness, tingling, tremors, sensory change, speech change, focal weakness, seizures, loss of consciousness and headaches.       Also postural imbalance  All other systems reviewed and are negative.         Past Medical History:  Diagnosis Date  . CAD in native artery  11/26/2016  . Enlarged prostate   . Hyperlipidemia LDL goal <70 12/04/2016  . Hypertension     Past Surgical History:  Procedure Laterality Date  . HERNIA REPAIR    . LEFT HEART CATH AND CORONARY ANGIOGRAPHY N/A 11/24/2016   Procedure: Left Heart Cath and Coronary Angiography;  Surgeon: Troy Sine, MD;  Location: [facility];  Service: Cardiovascular;  Laterality: N/A;    Social History: Patient lives with his wife.  The patient walks unassisted.  He is not a smoker.    No Known Allergies  Family history: family history includes Coronary artery disease in his brother, father, and sister.  Prior to Admission medications   Medication Sig Start Date End Date Taking? Authorizing Provider  acetaminophen (TYLENOL) 325 MG tablet Take 2 tablets (650 mg total) by mouth every 4 (four) hours as needed for headache or mild pain. 11/26/16  Yes Tiffany Carlota Raspberry, PA-C  amiodarone (PACERONE) 200 MG tablet Take 1 tablet (200 mg total) by mouth 2 (two) times daily. 12/06/16  Yes Brittainy Erie Noe, PA-C  apixaban (ELIQUIS) 5 MG TABS tablet Take 1 tablet (5 mg total) by mouth 2 (two) times daily. 12/06/16  Yes Brittainy Erie Noe, PA-C  aspirin 81 MG tablet Take 81 mg by mouth daily.    Yes Historical Provider, MD  atorvastatin (LIPITOR) 80 MG tablet Take 1 tablet (80 mg total) by mouth daily at 6 PM. 11/26/16  Yes Tiffany Carlota Raspberry, PA-C  Cholecalciferol (VITAMIN D PO) Take 1 capsule by mouth daily.   Yes Historical Provider, MD  finasteride (PROSCAR) 5 MG tablet Take 5 mg by mouth daily.   Yes  Historical Provider, MD  isosorbide mononitrate (IMDUR) 60 MG 24 hr tablet Take 1 tablet (60 mg total) by mouth daily. 12/06/16  Yes Brittainy Erie Noe, PA-C  lisinopril (PRINIVIL,ZESTRIL) 10 MG tablet Take 1 tablet (10 mg total) by mouth daily. 10/29/12  Yes Noemi Chapel, MD  nitroGLYCERIN (NITROSTAT) 0.4 MG SL tablet Place 1 tablet (0.4 mg total) under the tongue every 5 (five) minutes x 3 doses as needed for chest pain. 11/26/16  Yes Tiffany Carlota Raspberry, PA-C  pantoprazole (PROTONIX) 40 MG tablet Take 1 tablet (40 mg total) by mouth daily. 12/07/16  Yes Brittainy Erie Noe, PA-C  ranolazine (RANEXA) 500 MG 12 hr tablet Take 1 tablet (500 mg total) by mouth 2 (two) times daily. 12/06/16  Yes Brittainy Erie Noe, PA-C  tamsulosin (FLOMAX) 0.4 MG CAPS capsule Take 0.4 mg by mouth daily.    Yes Historical Provider, MD       Physical Exam: BP 126/72   Pulse 66   Temp 97.6 F (36.4 C) (Oral)   Resp 17   SpO2 100%  General appearance: Well-developed, adult male, alert and in no acute distress.   Eyes: Anicteric, conjunctiva pink, lids and lashes normal. PERRL.    ENT: No nasal deformity, discharge, epistaxis.  Hearing normal. OP moist without lesions.  Dentures. Neck: No neck masses.  Trachea midline.  No thyromegaly/tenderness. Lymph: No cervical or supraclavicular lymphadenopathy. Skin: Warm and dry.  No jaundice.  No suspicious rashes or lesions. Cardiac: RRR, nl S1-S2, no murmurs appreciated.  Capillary refill is brisk.  JVP normal.  No LE edema.  Radial and DP pulses 2+ and symmetric. Respiratory: Normal respiratory rate and rhythm.  CTAB without rales or wheezes. Abdomen: Abdomen soft.  No TTP. No ascites, distension, hepatosplenomegaly.   MSK: No deformities or effusions.  No cyanosis or clubbing. Neuro: Visual fields normal by confrontation bilaterally. Pupils are 3 mm and reactive to 2 mm.  Extraocular movements are intact, without nystagmus.  Cranial nerve 5 is within normal limits.   Cranial nerve 7 is symmetrical.  Cranial nerve 8 is within normal limits.  Cranial nerves 9 and 10 reveal equal palate elevation.  Cranial nerve 11 reveals sternocleidomastoid strong.  Cranial nerve 12 is midline. Motor strength testing is 5/5 in the upper and lower extremities bilaterally with normal motor, tone and bulk. Sensory examination is intact to light touch. Romberg maneuver is negative for pathology.  Finger-to-nose testing is within normal limits.  The patient is oriented to time, place and person.  Speech is fluent.  Naming is grossly intact.  Recall, recent and remote, as well as general fund of knowledge seem within normal limits.  Attention span and concentration are within normal limits. Psych: Sensorium intact and responding to questions, attention normal.  Behavior appropriate.  Affect normal.  Judgment and insight appear normal.     Labs on Admission:  I have personally reviewed following labs and  imaging studies: CBC:  Recent Labs Lab 12/04/16 0604 12/05/16 0532 12/06/16 0322 12/09/16 2334  WBC 5.0 5.3 5.2 5.2  NEUTROABS  --   --   --  3.4  HGB 14.4 13.9 13.3 15.4  HCT 43.6 42.7 40.8 45.7  MCV 82.3 82.4 82.3 81.6  PLT 248 249 244 [id number]   Basic Metabolic Panel:  Recent Labs Lab 12/04/16 0604 12/04/16 1038 12/05/16 0532 12/09/16 2334  NA 138  --  136 137  K 4.1  --  4.0 4.0  CL 105  --  102 101  CO2 21*  --  25 24  GLUCOSE 103*  --  99 111*  BUN 18  --  12 16  CREATININE 0.78  --  0.78 1.05  CALCIUM 9.3  --  9.3 10.1  MG  --  2.1  --   --    GFR: Estimated Creatinine Clearance: 65.7 mL/min (by C-G formula based on SCr of 1.05 mg/dL).  Liver Function Tests:  Recent Labs Lab 12/04/16 1038 12/09/16 2334  AST 27 18  ALT 31 22  ALKPHOS 72 67  BILITOT 0.4 0.5  PROT 6.7 6.7  ALBUMIN 3.6 3.9    Recent Labs Lab 12/09/16 2334  LIPASE 16   No results for input(s): AMMONIA in the last 168 hours. Coagulation Profile:  Recent Labs Lab  12/04/16 1038  INR 1.16   Cardiac Enzymes:  Recent Labs Lab 12/04/16 0604 12/04/16 1038 12/04/16 1806 12/04/16 2327  TROPONINI 1.67* 1.70* 1.60* 1.49*   BNP (last 3 results) No results for input(s): PROBNP in the last 8760 hours. HbA1C: No results for input(s): HGBA1C in the last 72 hours. CBG: No results for input(s): GLUCAP in the last 168 hours. Lipid Profile: No results for input(s): CHOL, HDL, LDLCALC, TRIG, CHOLHDL, LDLDIRECT in the last 72 hours. Thyroid Function Tests: No results for input(s): TSH, T4TOTAL, FREET4, T3FREE, THYROIDAB in the last 72 hours. Anemia Panel: No results for input(s): VITAMINB12, FOLATE, FERRITIN, TIBC, IRON, RETICCTPCT in the last 72 hours. Sepsis Labs: Invalid input(s): PROCALCITONIN, LACTICIDVEN Recent Results (from the past 240 hour(s))  MRSA PCR Screening     Status: None   Collection Time: 12/04/16 12:56 PM  Result Value Ref Range Status   MRSA by PCR NEGATIVE NEGATIVE Final    Comment:        The GeneXpert MRSA Assay (FDA approved for NASAL specimens only), is one component of a comprehensive MRSA colonization surveillance program. It is not intended to diagnose MRSA infection nor to guide or monitor treatment for MRSA infections.          Radiological Exams on Admission: Personally reviewed CXR shows no focal opacity: Dg Chest 2 View  Result Date: 12/10/2016 CLINICAL DATA:  Shortness of breath while resting. Intermittent LEFT flank pain. History of hypertension. EXAM: CHEST  2 VIEW COMPARISON:  Chest radiograph December 04, 2016 FINDINGS: Cardiac silhouette is mildly enlarged unchanged. Mediastinal silhouette is nonsuspicious, mildly calcified aortic knob. No pleural effusion or focal consolidation. No pneumothorax. Persistent mildly elevated LEFT hemidiaphragm. No pneumothorax. Mild broad dextroscoliosis and degenerative change of thoracic spine. IMPRESSION: Stable examination:  Mild cardiomegaly, no acute pulmonary process.  Electronically Signed   By: Elon Alas M.D.   On: 12/10/2016 00:12   Ct Head Wo Contrast  Result Date: 12/09/2016 CLINICAL DATA:  Shortness of breath at rest, vision loss when standing. History of hypertension, atrial fibrillation. EXAM: CT HEAD WITHOUT CONTRAST TECHNIQUE: Contiguous axial images were obtained from the  base of the skull through the vertex without intravenous contrast. COMPARISON:  None. FINDINGS: BRAIN: The ventricles and sulci are normal for age. No intraparenchymal hemorrhage, mass effect nor midline shift. Patchy supratentorial white matter hypodensities less than expected patient's age, though non-specific are most compatible with chronic small vessel ischemic disease. No acute large vascular territory infarcts. No abnormal extra-axial fluid collections. Basal cisterns are patent. VASCULAR: Mild calcific atherosclerosis of the carotid siphons. SKULL: No skull fracture. No significant scalp soft tissue swelling. SINUSES/ORBITS: Trace paranasal sinus mucosal thickening without air-fluid levels. Mastoid air cells are well aerated. Old LEFT medial orbital blowout fracture, ocular globes and orbital contents are otherwise unremarkable. OTHER: None. IMPRESSION: Negative CT HEAD for age. Electronically Signed   By: Elon Alas M.D.   On: 12/09/2016 23:56    EKG: Independently reviewed. Rate normal, sinus rhythm, T wave inversions, no ST changes.      Assessment/Plan  1. Loss of vision:    Bilateral and complete loss of vision is typically a symptom of low blood pressure/pre-syncope, which seems actually likely in this patient newly on Imdur, BB, Ranexa and amiodarone.  Would have to have complete basilar artery insufficiency and an occipital lobe fed ONLY by basilar arteries in order for this to be from a focal lesion/TIA.  Renal function good, will attempt arteriography. -CTA neck to rule out severe vertebrobasilar disease -MR brain ordered -Check orthostatics -IVF -Hold  ACEi and Imdur this morning -If imaging normal and no further episodes, discharge this afternoon   2. Hypertension and coronary disease:  -Continue aspirin and statin -Hold ranolazine, ACEi, Imdur this morning, restart on d/c  3. PAF:  CHADS2Vasc 3, age, HTN, CV disease.   -Continue apixaban -Continue amiodarone  4. Other medications:  -Continue tamsulosin, finasteride -Continue PPI          DVT prophylaxis: N/A  Code Status: FULL  Family Communication: None present  Disposition Plan: Anticipate Imaging this morning.  If imaging negative, home today. Consults called: None Admission status: OBS At the point of initial evaluation, it is my clinical opinion that admission for OBSERVATION is reasonable and necessary because the patient's presenting complaints in the context of their chronic conditions represent sufficient risk of deterioration or significant morbidity to constitute reasonable grounds for close observation in the hospital setting, but that the patient may be medically stable for discharge from the hospital within 24 to 48 hours.    Medical decision making: Patient seen at 2:00 AM on 12/10/2016.  The patient was discussed with Armstead Peaks, PA-C.  What exists of the patient's chart was reviewed in depth and summarized above.  Clinical condition: stable.        Edwin Dada [facility] Pager [phone]

## 2016-12-10 NOTE — ED Provider Notes (Signed)
Medical screening examination/treatment/procedure(s) were conducted as a shared visit with non-physician practitioner(s) and myself.  I personally evaluated the patient during the encounter.   EKG Interpretation  Date/Time:  Thursday December 09 2016 23:18:25 EST Ventricular Rate:  62 PR Interval:    QRS Duration: 91 QT Interval:  379 QTC Calculation: 385 R Axis:   1 Text Interpretation:  Sinus rhythm Left atrial enlargement Low voltage, precordial leads T wave abnormality in anterolateral leads has improved Confirmed by Maeva Dant,  DO, Lenaya Pietsch [phone]) on 12/10/2016 1:05:15 AM       Patient is a 76 year old male with history of atrial fibrillation, significant coronary artery disease who presents to the emergency department tonight with an episode of complete vision loss that lost at approximate 5-6 minutes. States that he woke up from sleep and felt okay and then states that he "lost my vision". Wife states he yelled out for her and she states that he was diaphoretic and appeared short of breath. He denies any headache during this time, numbness, tingling or focal weakness. No chest pain. States that symptoms resolved on their own and he is now back to his baseline. Labs here show elevated troponin but this has been trending down since recent NSTEMI.  He denies having any chest pain or chest discomfort. Does state he had some left mid lateral abdominal pain yesterday evening that resolved with aspirin. He is on Imdur and Ranexa.  He reports compliance with our request. Currently patient is hemodynamically stable. In a sinus rhythm. Heart and lung sounds normal. Normal neurologic exam. Workup in the emergency department including CT of the head and chest x-ray have been unremarkable. Concern for possible TIA given vision loss bilaterally for several minutes. NIH stroke scale is 0. Discussed with hospitalist for admission.   Martin, DO 12/10/16 0107

## 2016-12-10 NOTE — Progress Notes (Signed)
PROGRESS NOTE  Ronald Robinson  [id number] DOB: [birth year] DOA: 12/09/2016 PCP: Annetta Maw, MD Outpatient Specialists:  Subjective: On bed without complaints  Brief Narrative:  Ronald Robinson is a 76 y.o. male with a past medical history significant for HTN and CAD who presents with acute vision change and imbalane.  The patient was just admitted on 2/14/178 to the Cardiology service for NSTEMI.  Underwent urgent catheterization that showed diffuse disease, was evaluated by CT surgery felt not to be an operative candidate either.  Was started on aggressive DAPT, BB, ntirates, and ranolazine in addition to his amlodipine and lisinopril.  Was readmitted on 2/24 with chest pain, but this time new Afib in RVR.  Amiodarone was added and Plavix was changed to apixaban and he was discharged after two days.  He has been home now 1 week.  No further chest pains, no SOB, no new exertional symptoms, doing okay.  No new dizziness with standing, no fatigue, no orthostasis, no weakness, no lightheadedness with standing.  Tonight, he was watching television and dozed off.  When he woke, he couldn't see anything at all, it was as if his vision was "all brown".  He called his wife, who told him he was "falling over out of my chair" and so she called 9-1-1.  There was no vertigo.  There were no palpitations or heart racing.  There was no LOC, no speech disturbance, focal weakness, numbness, facial asymmetry, slurred speech or anything. He has noticed concentrated urine tonight. The whole episode lasted 5 minutes and had resolved before EMS arrived.    Assessment & Plan:   Principal Problem:   Complete loss of vision Active Problems:   Essential hypertension   PAF (paroxysmal atrial fibrillation) (HCC)   Coronary artery disease due to lipid rich plaque   Loss of vision:    -Bilateral and complete loss of vision is typically a symptom of low blood pressure/pre-syncope, rather than CVA. -Started  recently on Imdur, BB, Ranexa and amiodarone.   -CTA neck to rule out severe vertebrobasilar disease -MR brain negative -Started on IV fluids, check orthostatics. -From neurology standpoint not sure what can be added as patient is on Eliquis and low-dose aspirin  Hypertension and CAD:  -Complained about transient chest pain, troponin is 0.38, this is better than it was on 2/24. -Continue aspirin, statin, beta blockers. -Hold ranolazine, ACEI and Imdur. Positional blood pressure shows no evidence of orthostatic hypotension. -I have asked cardiology if they can comment on his medications.  PAF:  -CHADS2Vasc 3, age, HTN, CV disease.   -Rate is controlled, with bursts of RVR without symptoms. -Currently on Eliquis, continued.  Other medications:  -Continue tamsulosin, finasteride -Continue PPI   DVT prophylaxis: Eliquis Code Status: Full Code Family Communication:  Disposition Plan:  Diet: Diet Heart Room service appropriate? Yes; Fluid consistency: Thin  Consultants:   Cards  Procedures:   None  Antimicrobials:   None   Objective: Vitals:   12/10/16 1000 12/10/16 1015 12/10/16 1030 12/10/16 1045  BP: 145/79 134/75 131/71 155/98  Pulse: 67 64 67 76  Resp: 25 18 26 20   Temp:      TempSrc:      SpO2: 99% 98% 98% 100%   No intake or output data in the 24 hours ending 12/10/16 1155 There were no vitals filed for this visit.  Examination: General exam: Appears calm and comfortable  Respiratory system: Clear to auscultation. Respiratory effort normal. Cardiovascular system: S1 & S2 heard, RRR. No  JVD, murmurs, rubs, gallops or clicks. No pedal edema. Gastrointestinal system: Abdomen is nondistended, soft and nontender. No organomegaly or masses felt. Normal bowel sounds heard. Central nervous system: Alert and oriented. No focal neurological deficits. Extremities: Symmetric 5 x 5 power. Skin: No rashes, lesions or ulcers Psychiatry: Judgement and insight appear  normal. Mood & affect appropriate.   Data Reviewed: I have personally reviewed following labs and imaging studies  CBC:  Recent Labs Lab 12/04/16 0604 12/05/16 0532 12/06/16 0322 12/09/16 2334  WBC 5.0 5.3 5.2 5.2  NEUTROABS  --   --   --  3.4  HGB 14.4 13.9 13.3 15.4  HCT 43.6 42.7 40.8 45.7  MCV 82.3 82.4 82.3 81.6  PLT 248 249 244 [id number]   Basic Metabolic Panel:  Recent Labs Lab 12/04/16 0604 12/04/16 1038 12/05/16 0532 12/09/16 2334  NA 138  --  136 137  K 4.1  --  4.0 4.0  CL 105  --  102 101  CO2 21*  --  25 24  GLUCOSE 103*  --  99 111*  BUN 18  --  12 16  CREATININE 0.78  --  0.78 1.05  CALCIUM 9.3  --  9.3 10.1  MG  --  2.1  --   --    GFR: Estimated Creatinine Clearance: 65.7 mL/min (by C-G formula based on SCr of 1.05 mg/dL). Liver Function Tests:  Recent Labs Lab 12/04/16 1038 12/09/16 2334  AST 27 18  ALT 31 22  ALKPHOS 72 67  BILITOT 0.4 0.5  PROT 6.7 6.7  ALBUMIN 3.6 3.9    Recent Labs Lab 12/09/16 2334  LIPASE 16   No results for input(s): AMMONIA in the last 168 hours. Coagulation Profile:  Recent Labs Lab 12/04/16 1038  INR 1.16   Cardiac Enzymes:  Recent Labs Lab 12/04/16 0604 12/04/16 1038 12/04/16 1806 12/04/16 2327  TROPONINI 1.67* 1.70* 1.60* 1.49*   BNP (last 3 results) No results for input(s): PROBNP in the last 8760 hours. HbA1C: No results for input(s): HGBA1C in the last 72 hours. CBG: No results for input(s): GLUCAP in the last 168 hours. Lipid Profile: No results for input(s): CHOL, HDL, LDLCALC, TRIG, CHOLHDL, LDLDIRECT in the last 72 hours. Thyroid Function Tests: No results for input(s): TSH, T4TOTAL, FREET4, T3FREE, THYROIDAB in the last 72 hours. Anemia Panel: No results for input(s): VITAMINB12, FOLATE, FERRITIN, TIBC, IRON, RETICCTPCT in the last 72 hours. Urine analysis:    Component Value Date/Time   COLORURINE AMBER (A) 12/09/2016 2343   APPEARANCEUR HAZY (A) 12/09/2016 2343   LABSPEC  1.025 12/09/2016 2343   PHURINE 5.0 12/09/2016 2343   GLUCOSEU NEGATIVE 12/09/2016 2343   HGBUR NEGATIVE 12/09/2016 2343   BILIRUBINUR NEGATIVE 12/09/2016 2343   KETONESUR 20 (A) 12/09/2016 2343   PROTEINUR 100 (A) 12/09/2016 2343   UROBILINOGEN 1.0 05/20/2008 0024   NITRITE NEGATIVE 12/09/2016 2343   LEUKOCYTESUR NEGATIVE 12/09/2016 2343   Sepsis Labs: @LABRCNTIP (procalcitonin:4,lacticidven:4)  ) Recent Results (from the past 240 hour(s))  MRSA PCR Screening     Status: None   Collection Time: 12/04/16 12:56 PM  Result Value Ref Range Status   MRSA by PCR NEGATIVE NEGATIVE Final    Comment:        The GeneXpert MRSA Assay (FDA approved for NASAL specimens only), is one component of a comprehensive MRSA colonization surveillance program. It is not intended to diagnose MRSA infection nor to guide or monitor treatment for MRSA infections.  Invalid input(s): PROCALCITONIN, LACTICACIDVEN   Radiology Studies: Dg Chest 2 View  Result Date: 12/10/2016 CLINICAL DATA:  Shortness of breath while resting. Intermittent LEFT flank pain. History of hypertension. EXAM: CHEST  2 VIEW COMPARISON:  Chest radiograph December 04, 2016 FINDINGS: Cardiac silhouette is mildly enlarged unchanged. Mediastinal silhouette is nonsuspicious, mildly calcified aortic knob. No pleural effusion or focal consolidation. No pneumothorax. Persistent mildly elevated LEFT hemidiaphragm. No pneumothorax. Mild broad dextroscoliosis and degenerative change of thoracic spine. IMPRESSION: Stable examination:  Mild cardiomegaly, no acute pulmonary process. Electronically Signed   By: Elon Alas M.D.   On: 12/10/2016 00:12   Ct Head Wo Contrast  Result Date: 12/09/2016 CLINICAL DATA:  Shortness of breath at rest, vision loss when standing. History of hypertension, atrial fibrillation. EXAM: CT HEAD WITHOUT CONTRAST TECHNIQUE: Contiguous axial images were obtained from the base of the skull through the vertex  without intravenous contrast. COMPARISON:  None. FINDINGS: BRAIN: The ventricles and sulci are normal for age. No intraparenchymal hemorrhage, mass effect nor midline shift. Patchy supratentorial white matter hypodensities less than expected patient's age, though non-specific are most compatible with chronic small vessel ischemic disease. No acute large vascular territory infarcts. No abnormal extra-axial fluid collections. Basal cisterns are patent. VASCULAR: Mild calcific atherosclerosis of the carotid siphons. SKULL: No skull fracture. No significant scalp soft tissue swelling. SINUSES/ORBITS: Trace paranasal sinus mucosal thickening without air-fluid levels. Mastoid air cells are well aerated. Old LEFT medial orbital blowout fracture, ocular globes and orbital contents are otherwise unremarkable. OTHER: None. IMPRESSION: Negative CT HEAD for age. Electronically Signed   By: Elon Alas M.D.   On: 12/09/2016 23:56   Ct Angio Neck W/cm &/or Wo/cm  Result Date: 12/10/2016 CLINICAL DATA:  Vertebrobasilar artery syndrome.  Visual loss. EXAM: CT ANGIOGRAPHY NECK TECHNIQUE: Multidetector CT imaging of the neck was performed using the standard protocol during bolus administration of intravenous contrast. Multiplanar CT image reconstructions and MIPs were obtained to evaluate the vascular anatomy. Carotid stenosis measurements (when applicable) are obtained utilizing NASCET criteria, using the distal internal carotid diameter as the denominator. CONTRAST:  50 mL Isovue-300 COMPARISON:  None. FINDINGS: Aortic arch: Standard 3 vessel aortic arch with mild atherosclerotic plaque. Brachiocephalic and subclavian arteries are widely patent. Right carotid system: Patent without evidence of dissection or significant stenosis. Mild calcified plaque in the proximal ICA. Left carotid system: Patent without evidence of dissection or significant stenosis. Moderate calcified and mild soft plaque about the carotid bifurcation.  Vertebral arteries: The vertebral arteries are patent with the left being moderately dominant. There is focal calcified plaque at the right vertebral artery origin with likely moderate stenosis. Calcified plaque near the left vertebral artery origin and calcified and soft plaque in the proximal left V1 segment do not result in significant stenosis. There are additional areas of mild-to-moderate atherosclerotic luminal irregularity in the left V2 segment without significant stenosis. Skeleton: Moderate multilevel cervical disc degeneration. Other neck: Enlarged, heterogeneous left thyroid lobe with an underlying 2.2 cm nodule. Upper chest: Visualized lung apices are clear. IMPRESSION: 1. 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 2. Mild to moderate left vertebral artery atherosclerosis without significant stenosis. 3. Left greater than right cervical carotid artery atherosclerosis without stenosis. 4. 2.2 cm left thyroid nodule. Non-urgent thyroid ultrasound is recommended for further evaluation. Electronically Signed   By: Logan Bores M.D.   On: 12/10/2016 08:26   Mr Brain Wo Contrast  Result Date: 12/10/2016 CLINICAL DATA:  Shortness  of breath at rest, vision loss when standing. Now at baseline. Evaluate vertebrobasilar artery syndrome. History of hypertension, hyperlipidemia and atrial fibrillation. EXAM: MRI HEAD WITHOUT CONTRAST TECHNIQUE: Multiplanar, multiecho pulse sequences of the brain and surrounding structures were obtained without intravenous contrast. COMPARISON:  CT HEAD December 09, 2016 FINDINGS: BRAIN: No reduced diffusion to suggest acute ischemia. No susceptibility artifact to suggest hemorrhage. The ventricles and sulci are normal for patient's age. Patchy supratentorial and to lesser extent pontine white matter FLAIR T2 hyperintensities. No suspicious parenchymal signal, masses or mass effect. No abnormal extra-axial fluid collections. VASCULAR: Normal major  intracranial vascular flow voids present at skull base. Dolichoectasia associated with chronic hypertension. SKULL AND UPPER CERVICAL SPINE: No abnormal sellar expansion. No suspicious calvarial bone marrow signal. Craniocervical junction maintained. SINUSES/ORBITS: The mastoid air-cells and included paranasal sinuses are well-aerated. Old LEFT medial orbital blowout fracture. The included ocular globes and orbital contents are non-suspicious. OTHER: Patient is edentulous. IMPRESSION: No acute intracranial process. Involutional changes and moderate chronic small vessel ischemic disease. Electronically Signed   By: Elon Alas M.D.   On: 12/10/2016 03:34        Scheduled Meds: . amiodarone  200 mg Oral BID  . apixaban  5 mg Oral BID  . aspirin EC  81 mg Oral Daily  . atorvastatin  80 mg Oral q1800  . finasteride  5 mg Oral Daily  . pantoprazole  40 mg Oral Daily  . sodium chloride flush  3 mL Intravenous Q12H  . tamsulosin  0.4 mg Oral Daily   Continuous Infusions: . sodium chloride 100 mL/hr at 12/10/16 0222     LOS: 0 days    Time spent: 35 minutes    Itzel Mckibbin A, MD [facility] Pager [phone]  If 7PM-7AM, please contact night-coverage [email or web address] Password [facility] 12/10/2016, 11:55 AM

## 2016-12-10 NOTE — ED Notes (Signed)
Patient transported to MRI 

## 2016-12-10 NOTE — Consult Note (Addendum)
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   Past Medical History   Past Medical History:  Diagnosis Date  . CAD in native artery  11/26/2016  . Enlarged prostate   . Hyperlipidemia LDL goal <70 12/04/2016  . Hypertension     Past Surgical History:  Procedure Laterality Date  . HERNIA REPAIR    . 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 He initially presented to [facility] 11/23/16 with SScp. EKG showed AF RVR, with subtle ST elevations and TWI with trop elevation of 2.13. He was transferred to [facility] for cardiac cath. Cath with Dr. Claiborne Billings showed multivessel disease not amenable to stenting. Was referred to CVTS but determined not a surgical candidate given extent of his disease. He was placed on medical therapy.   Presented back to [facility] on 12/04/16 with chest pain and found to have PAF. Trop peaked at 1.7. He was started on oral amiodarone and placed on Eliquis for [place]. Plavix was stopped and ASA resumed. Imdur was increased and Ranaxa added. He converted back to SR with medication. He was discharged on 12/06/16.   Reports he has been doing well since going home, no further episodes of chest pain. He actually went out shopping with his wife yesterday and felt well.  Reports he was  sitting on the couch last evening and fell asleep. When he woke up, he could not see. Unable to describe, but vision was not black or blurred. He woke his wife up who was next to him. She noticed he was leaning to the right side off the couch. Vision lasted about 5 minutes, and then blurred and cleared. Did have the feeling of palpitations, diaphoresis and chest pain. Wife called EMS.   In the ED labs showed stable electrolytes, Hgb 15.4, POC trop 0.38. EKG on arrival showed SR with improved TWI in anteroseptal leads. CT head was negative. CTA neck negative. CXR clear. He was admitted to [facility] for further work up. While waiting for a bed in the ED he developed bursts of AF RVR, but then returns to SR with rates between 50-70. Denies any symptoms at this time.    Inpatient Medications    . amiodarone  200 mg Oral BID  . apixaban  5 mg Oral BID  . aspirin EC  81 mg Oral Daily  . atorvastatin  80 mg Oral q1800  . finasteride  5 mg Oral Daily  . pantoprazole  40 mg Oral Daily  . sodium chloride flush  3 mL Intravenous Q12H  . tamsulosin  0.4 mg Oral Daily    Family History    Family History  Problem Relation Age of Onset  . Coronary artery disease Father   . Coronary artery disease Sister   . Coronary artery disease  Brother     Social History    Social History   Social History  . Marital status: Married    Spouse name: N/A  . Number of children: N/A  . Years of education: N/A   Occupational History  . Not on file.   Social History Main Topics  . Smoking status: Never Smoker  . Smokeless tobacco: Never Used  . Alcohol use Yes     Comment: rare beer  . Drug use: No  . Sexual activity: Not on file   Other Topics Concern  . Not on file   Social History Narrative  . No narrative on file     Review of Systems    General:  No chills, fever, night sweats or weight changes.  Cardiovascular:  See HPI Dermatological: No rash, lesions/masses Respiratory: No cough,  dyspnea Urologic: No hematuria, dysuria Abdominal:   No nausea, vomiting, diarrhea, bright red blood per rectum, melena, or hematemesis Neurologic:  ++ visual changes, wkns, changes in mental status. All other systems reviewed and are otherwise negative except as noted above.  Physical Exam    Blood pressure 155/98, pulse 76, temperature 98.2 F (36.8 C), temperature source Oral, resp. rate 20, SpO2 100 %.  General: [name], NAD Psych: Normal affect. Neuro: Alert and oriented X 3. Moves all extremities spontaneously. HEENT: Normal  Neck: Supple without bruits or JVD. Lungs:  Resp regular and unlabored, CTA. Heart: RRR no s3, s4, or murmurs. Abdomen: Soft, non-tender, non-distended, BS + x 4.  Extremities: No clubbing, cyanosis or edema. DP/PT/Radials 2+ and equal bilaterally.  Labs    Troponin (Point of Care Test)  Recent Labs  12/10/16 0001  TROPIPOC 0.38*   No results for input(s): CKTOTAL, CKMB, TROPONINI in the last 72 hours. Lab Results  Component Value Date   WBC 5.2 12/09/2016   HGB 15.4 12/09/2016   HCT 45.7 12/09/2016   MCV 81.6 12/09/2016   PLT 222 12/09/2016    Recent Labs Lab 12/09/16 2334  NA 137  K 4.0  CL 101  CO2 24  BUN 16  CREATININE 1.05  CALCIUM 10.1  PROT 6.7  BILITOT 0.5  ALKPHOS 67  ALT 22  AST 18  GLUCOSE 111*   Lab Results  Component Value Date   CHOL 146 11/25/2016   HDL 49 11/25/2016   LDLCALC 90 11/25/2016   TRIG 34 11/25/2016   No results found for: [facility]   Radiology Studies    Dg Chest 2 View  Result Date: 12/10/2016 CLINICAL DATA:  Shortness of breath while resting. Intermittent LEFT flank pain. History of hypertension. EXAM: CHEST  2 VIEW COMPARISON:  Chest radiograph December 04, 2016 FINDINGS: Cardiac silhouette is mildly enlarged unchanged. Mediastinal silhouette is nonsuspicious, mildly calcified aortic knob. No pleural effusion or focal consolidation. No pneumothorax. Persistent mildly elevated LEFT  hemidiaphragm. No pneumothorax. Mild broad dextroscoliosis and degenerative change of thoracic spine. IMPRESSION: Stable examination:  Mild cardiomegaly, no acute pulmonary process. Electronically Signed   By: Elon Alas M.D.   On: 12/10/2016 00:12   Ct Head Wo Contrast  Result Date: 12/09/2016 CLINICAL DATA:  Shortness of breath at rest, vision loss when standing. History of hypertension, atrial fibrillation. EXAM: CT HEAD WITHOUT CONTRAST TECHNIQUE: Contiguous axial images were obtained from the base of the skull through the vertex without intravenous contrast. COMPARISON:  None. FINDINGS: BRAIN: The ventricles and sulci are normal for age. No intraparenchymal hemorrhage, mass effect nor midline shift. Patchy supratentorial white matter  hypodensities less than expected patient's age, though non-specific are most compatible with chronic small vessel ischemic disease. No acute large vascular territory infarcts. No abnormal extra-axial fluid collections. Basal cisterns are patent. VASCULAR: Mild calcific atherosclerosis of the carotid siphons. SKULL: No skull fracture. No significant scalp soft tissue swelling. SINUSES/ORBITS: Trace paranasal sinus mucosal thickening without air-fluid levels. Mastoid air cells are well aerated. Old LEFT medial orbital blowout fracture, ocular globes and orbital contents are otherwise unremarkable. OTHER: None. IMPRESSION: Negative CT HEAD for age. Electronically Signed   By: Elon Alas M.D.   On: 12/09/2016 23:56   Ct Angio Neck W/cm &/or Wo/cm  Result Date: 12/10/2016 CLINICAL DATA:  Vertebrobasilar artery syndrome.  Visual loss. EXAM: CT ANGIOGRAPHY NECK TECHNIQUE: Multidetector CT imaging of the neck was performed using the standard protocol during bolus administration of intravenous contrast. Multiplanar CT image reconstructions and MIPs were obtained to evaluate the vascular anatomy. Carotid stenosis measurements (when applicable) are obtained utilizing  NASCET criteria, using the distal internal carotid diameter as the denominator. CONTRAST:  50 mL Isovue-300 COMPARISON:  None. FINDINGS: Aortic arch: Standard 3 vessel aortic arch with mild atherosclerotic plaque. Brachiocephalic and subclavian arteries are widely patent. Right carotid system: Patent without evidence of dissection or significant stenosis. Mild calcified plaque in the proximal ICA. Left carotid system: Patent without evidence of dissection or significant stenosis. Moderate calcified and mild soft plaque about the carotid bifurcation. Vertebral arteries: The vertebral arteries are patent with the left being moderately dominant. There is focal calcified plaque at the right vertebral artery origin with likely moderate stenosis. Calcified plaque near the left vertebral artery origin and calcified and soft plaque in the proximal left V1 segment do not result in significant stenosis. There are additional areas of mild-to-moderate atherosclerotic luminal irregularity in the left V2 segment without significant stenosis. Skeleton: Moderate multilevel cervical disc degeneration. Other neck: Enlarged, heterogeneous left thyroid lobe with an underlying 2.2 cm nodule. Upper chest: Visualized lung apices are clear. IMPRESSION: 1. 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 2. Mild to moderate left vertebral artery atherosclerosis without significant stenosis. 3. Left greater than right cervical carotid artery atherosclerosis without stenosis. 4. 2.2 cm left thyroid nodule. Non-urgent thyroid ultrasound is recommended for further evaluation. Electronically Signed   By: Logan Bores M.D.   On: 12/10/2016 08:26   Mr Brain Wo Contrast  Result Date: 12/10/2016 CLINICAL DATA:  Shortness of breath at rest, vision loss when standing. Now at baseline. Evaluate vertebrobasilar artery syndrome. History of hypertension, hyperlipidemia and atrial fibrillation. EXAM: MRI HEAD WITHOUT CONTRAST  TECHNIQUE: Multiplanar, multiecho pulse sequences of the brain and surrounding structures were obtained without intravenous contrast. COMPARISON:  CT HEAD December 09, 2016 FINDINGS: BRAIN: No reduced diffusion to suggest acute ischemia. No susceptibility artifact to suggest hemorrhage. The ventricles and sulci are normal for patient's age. Patchy supratentorial and to lesser extent pontine white matter FLAIR T2 hyperintensities. No suspicious parenchymal signal, masses or mass effect. No abnormal extra-axial fluid collections. VASCULAR: Normal major intracranial vascular flow voids present at skull base. Dolichoectasia associated with chronic hypertension. SKULL AND UPPER CERVICAL SPINE: No abnormal sellar expansion. No suspicious calvarial bone marrow signal. Craniocervical junction maintained. SINUSES/ORBITS: The mastoid air-cells and included paranasal sinuses are well-aerated. Old LEFT medial orbital blowout fracture. The included ocular globes and orbital contents are non-suspicious. OTHER: Patient is edentulous. IMPRESSION: No acute intracranial process. Involutional changes and moderate chronic small vessel ischemic disease. Electronically Signed   By:  Elon Alas M.D.   On: 12/10/2016 03:34    ECG & Cardiac Imaging    EKG: SR  Echo: N/A  Cath: 11/24/16   Prox RCA to Mid RCA lesion, 70 %stenosed.  Post Atrio lesion, 90 %stenosed.  Ramus lesion, 80 %stenosed.  Prox LAD to Mid LAD lesion, 95 %stenosed.  Dist LAD-2 lesion, 95 %stenosed.  Dist LAD-1 lesion, 90 %stenosed.  Ost 1st Mrg lesion, 90 %stenosed.  Mid Cx lesion, 80 %stenosed.  Dist Cx lesion, 99 %stenosed.  Dist RCA lesion, 40 %stenosed.   Relatively preserved global LV contractility with an catenoid  appearing left ventricle.  LVEDP is normal.  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   1. Vision changes: Lasted about 5 minutes and then resolved. No further episodes. CT head CTA neck and MRI neg. Management per primary  2. Chest pain/palpitations: Short episode with this change in his vision. Known to have severe diffuse disease not amendable to PCI, and not CABG candidate. Currently on medical therapy.  -- Reports he has been doing well since being dc'ed home while on Imdur and Ranexa. Suspect he could have had elevated HR when chest pain occurred.   3. PAF: Was in SR on admission but has had bursts of AF RVR while waiting in the ED. Currently on amiodarone and Eliquis for [place]. Could consider adding on low dose BB, but he does brady into the 50s while resting. Currently in SR and asymptomatic.  -- This patients CHA2DS2-VASc Score and unadjusted Ischemic Stroke Rate (% per year) is equal to 3.2 % stroke rate/year from a score of 3  4. Multivessel CAD: Continue with medical therapy. ASA, statin, add low dose BB?, ACEi/ARB? this admission.   Barnet Pall, NP-C Pager [phone] 12/10/2016, 11:46 AM   The patient has been seen in conjunction with Harlan Stains, NP-C. All aspects of care have been considered and discussed. The patient has been personally interviewed, examined, and all clinical data has been reviewed.   Paroxysmal atrial fibrillation. Currently being loaded with amiodarone. Will transiently increase the dose of amiodarone to facilitate faster loading. At discharge He should go back to 200 mg twice a day. This should eventually be decreased to 200 mg daily.  No other specific workup given his presentation.  He will have an increased risk of bleeding on aspirin and Eliquis.  Unable to afford the cardiac med regiment. Recommend DC Ranexa and consider switching to coumadin from Eliquis.

## 2016-12-10 NOTE — ED Notes (Signed)
Pt brought back from CT.

## 2016-12-10 NOTE — ED Notes (Signed)
Pt's HR jumping up into 150's on monitor in Afib. MD aware. EKG printed.

## 2016-12-11 DIAGNOSIS — I480 Paroxysmal atrial fibrillation: Secondary | ICD-10-CM

## 2016-12-11 DIAGNOSIS — I2510 Atherosclerotic heart disease of native coronary artery without angina pectoris: Secondary | ICD-10-CM

## 2016-12-11 DIAGNOSIS — I10 Essential (primary) hypertension: Secondary | ICD-10-CM

## 2016-12-11 DIAGNOSIS — I2583 Coronary atherosclerosis due to lipid rich plaque: Secondary | ICD-10-CM

## 2016-12-11 DIAGNOSIS — R42 Dizziness and giddiness: Secondary | ICD-10-CM

## 2016-12-11 LAB — GLUCOSE, CAPILLARY: Glucose-Capillary: 80 mg/dL (ref 65–99)

## 2016-12-11 NOTE — Discharge Summary (Signed)
Physician Discharge Summary  Ronald Robinson [id number] DOB: [birth year] DOA: 12/09/2016  PCP: Annetta Maw, MD  Admit date: 12/09/2016 Discharge date: 12/11/2016  Admitted From: Home Disposition: Home  Recommendations for Outpatient Follow-up:  1. Follow up with PCP in 1-2 weeks. 2. Please obtain BMP/CBC in one week.  Home Health: NA Equipment/Devices:NA  Discharge Condition: Stable CODE STATUS: Full Code Diet recommendation: Diet Heart Room service appropriate? Yes; Fluid consistency: Thin Diet - low sodium heart healthy  Brief/Interim Summary: The patient was just admitted on 2/14/178 to the Cardiology service for NSTEMI. Underwent urgent catheterization that showed diffuse disease, was evaluated by CT surgery felt not to be an operative candidate either. Was started on aggressive DAPT, BB, ntirates, and ranolazine in addition to his amlodipine and lisinopril. Was readmitted on 2/24 with chest pain, but this time new Afib in RVR. Amiodarone was added and Plavix was changed to apixaban and he was discharged after two days.  He has been home now 1 week. No further chest pains, no SOB, no new exertional symptoms, doing okay. No new dizziness with standing, no fatigue, no orthostasis, no weakness, no lightheadedness with standing. Tonight, he was watching television and dozed off. When he woke, he couldn't see anything at all, it was as if his vision was "all brown". He called his wife, who told him he was "falling over out of my chair" and so she called 9-1-1. There was no vertigo. There were no palpitations or heart racing. There was no LOC, no speech disturbance, focal weakness, numbness, facial asymmetry, slurred speech or anything. He has noticed concentrated urine tonight. The whole episode lasted 5 minutes and had resolved before EMS arrived.   Discharge Diagnoses:  Principal Problem:   Complete loss of vision Active Problems:   Essential hypertension   PAF  (paroxysmal atrial fibrillation) (HCC)   Coronary artery disease due to lipid rich plaque   Dizziness   Loss of vision: -Bilateral and complete loss of vision is typically a symptom of low blood pressure/pre-syncope, rather than CVA. -Started recently on Imdur, BB, Ranexa and amiodarone.  -CTA neck showed no evidence of severe vertebrobasilar disease -MR brain negative for acute CVA -Orthostatic as negative -Symptoms resolved, not a lot can be added from neurology standpoint, patient already on Eliquis and LD of aspirin.  Hypertension and CAD: -Complained about transient chest pain, troponin is 0.38, this is better than it was on 2/24. -Continue aspirin, statin, beta blockers. -Hold ranolazine, ACEI and Imdur. Positional blood pressure shows no evidence of orthostatic hypotension. -Cardiology evaluated the patient, has extensive diffuse multivessel disease not amenable to PCI or CABG. -Recommended to DC Ranexa and to consider switch to warfarin as outpatient (has difficulties affording his medications)  PAF: -CHADS2Vasc 3, age, HTN, CV disease.  -Rate is controlled, with bursts of RVR without symptoms. -Cardiology consulted, recommended to continue amiodarone at Eliquis (consider to change warfarin as outpatient) -Reluctant to add beta blocker as his resting heart rate is in the low 60s.  Other medications: -Continue tamsulosin, finasteride -Continue PPI  Findings on the last cardiac catheterization done on 11/24/2016      Discharge Instructions  Discharge Instructions    Diet - low sodium heart healthy    Complete by:  As directed    Increase activity slowly    Complete by:  As directed      Allergies as of 12/11/2016   No Known Allergies     Medication List    STOP taking these medications  ranolazine 500 MG 12 hr tablet Commonly known as:  RANEXA     TAKE these medications   acetaminophen 325 MG tablet Commonly known as:  TYLENOL Take 2 tablets  (650 mg total) by mouth every 4 (four) hours as needed for headache or mild pain.   amiodarone 200 MG tablet Commonly known as:  PACERONE Take 1 tablet (200 mg total) by mouth 2 (two) times daily.   apixaban 5 MG Tabs tablet Commonly known as:  ELIQUIS Take 1 tablet (5 mg total) by mouth 2 (two) times daily.   aspirin 81 MG tablet Take 81 mg by mouth daily.   atorvastatin 80 MG tablet Commonly known as:  LIPITOR Take 1 tablet (80 mg total) by mouth daily at 6 PM.   finasteride 5 MG tablet Commonly known as:  PROSCAR Take 5 mg by mouth daily.   isosorbide mononitrate 60 MG 24 hr tablet Commonly known as:  IMDUR Take 1 tablet (60 mg total) by mouth daily.   lisinopril 10 MG tablet Commonly known as:  PRINIVIL,ZESTRIL Take 1 tablet (10 mg total) by mouth daily.   nitroGLYCERIN 0.4 MG SL tablet Commonly known as:  NITROSTAT Place 1 tablet (0.4 mg total) under the tongue every 5 (five) minutes x 3 doses as needed for chest pain.   pantoprazole 40 MG tablet Commonly known as:  PROTONIX Take 1 tablet (40 mg total) by mouth daily.   tamsulosin 0.4 MG Caps capsule Commonly known as:  FLOMAX Take 0.4 mg by mouth daily.   VITAMIN D PO Take 1 capsule by mouth daily.       No Known Allergies  Consultations:  Treatment Team:   Rounding L[facility]cardiology, MD  Procedures (Echo, Carotid, EGD, Colonoscopy, ERCP)   Radiological studies: Dg Chest 2 View  Result Date: 12/10/2016 CLINICAL DATA:  Shortness of breath while resting. Intermittent LEFT flank pain. History of hypertension. EXAM: CHEST  2 VIEW COMPARISON:  Chest radiograph December 04, 2016 FINDINGS: Cardiac silhouette is mildly enlarged unchanged. Mediastinal silhouette is nonsuspicious, mildly calcified aortic knob. No pleural effusion or focal consolidation. No pneumothorax. Persistent mildly elevated LEFT hemidiaphragm. No pneumothorax. Mild broad dextroscoliosis and degenerative change of thoracic spine. IMPRESSION:  Stable examination:  Mild cardiomegaly, no acute pulmonary process. Electronically Signed   By: Elon Alas M.D.   On: 12/10/2016 00:12   Dg Chest 2 View  Result Date: 12/04/2016 CLINICAL DATA:  76 year old male with chest pain. EXAM: CHEST  2 VIEW COMPARISON:  11/24/2016 and 01/12/2011 chest radiographs FINDINGS: Cardiomegaly again identified. There is no evidence of focal airspace disease, pulmonary edema, suspicious pulmonary nodule/mass, pleural effusion, or pneumothorax. No acute bony abnormalities are identified. IMPRESSION: Cardiomegaly without evidence of acute cardiopulmonary disease. Electronically Signed   By: Margarette Canada M.D.   On: 12/04/2016 06:56   Dg Chest 2 View  Result Date: 11/24/2016 CLINICAL DATA:  Sharp midsternal chest pain. EXAM: CHEST  2 VIEW COMPARISON:  01/12/2011 CXR FINDINGS: Top normal size cardiac silhouette. Slight uncoiling of the thoracic aorta without aneurysm. There is aortic atherosclerosis. No pulmonary consolidation, effusion or pneumothorax. No overt pulmonary edema. Degenerative change along the dorsal spine. IMPRESSION: No active cardiopulmonary disease.  Aortic atherosclerosis. Electronically Signed   By: Ashley Royalty M.D.   On: 11/24/2016 00:50   Ct Head Wo Contrast  Result Date: 12/09/2016 CLINICAL DATA:  Shortness of breath at rest, vision loss when standing. History of hypertension, atrial fibrillation. EXAM: CT HEAD WITHOUT CONTRAST TECHNIQUE: Contiguous axial images were  obtained from the base of the skull through the vertex without intravenous contrast. COMPARISON:  None. FINDINGS: BRAIN: The ventricles and sulci are normal for age. No intraparenchymal hemorrhage, mass effect nor midline shift. Patchy supratentorial white matter hypodensities less than expected patient's age, though non-specific are most compatible with chronic small vessel ischemic disease. No acute large vascular territory infarcts. No abnormal extra-axial fluid collections. Basal  cisterns are patent. VASCULAR: Mild calcific atherosclerosis of the carotid siphons. SKULL: No skull fracture. No significant scalp soft tissue swelling. SINUSES/ORBITS: Trace paranasal sinus mucosal thickening without air-fluid levels. Mastoid air cells are well aerated. Old LEFT medial orbital blowout fracture, ocular globes and orbital contents are otherwise unremarkable. OTHER: None. IMPRESSION: Negative CT HEAD for age. Electronically Signed   By: Elon Alas M.D.   On: 12/09/2016 23:56   Ct Angio Neck W/cm &/or Wo/cm  Result Date: 12/10/2016 CLINICAL DATA:  Vertebrobasilar artery syndrome.  Visual loss. EXAM: CT ANGIOGRAPHY NECK TECHNIQUE: Multidetector CT imaging of the neck was performed using the standard protocol during bolus administration of intravenous contrast. Multiplanar CT image reconstructions and MIPs were obtained to evaluate the vascular anatomy. Carotid stenosis measurements (when applicable) are obtained utilizing NASCET criteria, using the distal internal carotid diameter as the denominator. CONTRAST:  50 mL Isovue-300 COMPARISON:  None. FINDINGS: Aortic arch: Standard 3 vessel aortic arch with mild atherosclerotic plaque. Brachiocephalic and subclavian arteries are widely patent. Right carotid system: Patent without evidence of dissection or significant stenosis. Mild calcified plaque in the proximal ICA. Left carotid system: Patent without evidence of dissection or significant stenosis. Moderate calcified and mild soft plaque about the carotid bifurcation. Vertebral arteries: The vertebral arteries are patent with the left being moderately dominant. There is focal calcified plaque at the right vertebral artery origin with likely moderate stenosis. Calcified plaque near the left vertebral artery origin and calcified and soft plaque in the proximal left V1 segment do not result in significant stenosis. There are additional areas of mild-to-moderate atherosclerotic luminal  irregularity in the left V2 segment without significant stenosis. Skeleton: Moderate multilevel cervical disc degeneration. Other neck: Enlarged, heterogeneous left thyroid lobe with an underlying 2.2 cm nodule. Upper chest: Visualized lung apices are clear. IMPRESSION: 1. 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 2. Mild to moderate left vertebral artery atherosclerosis without significant stenosis. 3. Left greater than right cervical carotid artery atherosclerosis without stenosis. 4. 2.2 cm left thyroid nodule. Non-urgent thyroid ultrasound is recommended for further evaluation. Electronically Signed   By: Logan Bores M.D.   On: 12/10/2016 08:26   Mr Brain Wo Contrast  Result Date: 12/10/2016 CLINICAL DATA:  Shortness of breath at rest, vision loss when standing. Now at baseline. Evaluate vertebrobasilar artery syndrome. History of hypertension, hyperlipidemia and atrial fibrillation. EXAM: MRI HEAD WITHOUT CONTRAST TECHNIQUE: Multiplanar, multiecho pulse sequences of the brain and surrounding structures were obtained without intravenous contrast. COMPARISON:  CT HEAD December 09, 2016 FINDINGS: BRAIN: No reduced diffusion to suggest acute ischemia. No susceptibility artifact to suggest hemorrhage. The ventricles and sulci are normal for patient's age. Patchy supratentorial and to lesser extent pontine white matter FLAIR T2 hyperintensities. No suspicious parenchymal signal, masses or mass effect. No abnormal extra-axial fluid collections. VASCULAR: Normal major intracranial vascular flow voids present at skull base. Dolichoectasia associated with chronic hypertension. SKULL AND UPPER CERVICAL SPINE: No abnormal sellar expansion. No suspicious calvarial bone marrow signal. Craniocervical junction maintained. SINUSES/ORBITS: The mastoid air-cells and included paranasal sinuses are well-aerated.  Old LEFT medial orbital blowout fracture. The included ocular globes and orbital  contents are non-suspicious. OTHER: Patient is edentulous. IMPRESSION: No acute intracranial process. Involutional changes and moderate chronic small vessel ischemic disease. Electronically Signed   By: Elon Alas M.D.   On: 12/10/2016 03:34     Subjective:  Discharge Exam: Vitals:   12/10/16 1500 12/10/16 1555 12/10/16 2136 12/11/16 0458  BP: 128/84 (!) 152/75 108/66 127/70  Pulse: 71 70 82 66  Resp: 17 20 20 18   Temp:  97.9 F (36.6 C) 98.6 F (37 C) 97.6 F (36.4 C)  TempSrc:  Oral  Oral  SpO2: 97% 100% 100% 98%  Weight:  79 kg (174 lb 3.2 oz)  78.7 kg (173 lb 6.4 oz)  Height:  5\' 11"  (1.803 m)     General: Pt is alert, awake, not in acute distress Cardiovascular: RRR, S1/S2 +, no rubs, no gallops Respiratory: CTA bilaterally, no wheezing, no rhonchi Abdominal: Soft, NT, ND, bowel sounds + Extremities: no edema, no cyanosis   The results of significant diagnostics from this hospitalization (including imaging, microbiology, ancillary and laboratory) are listed below for reference.    Microbiology: Recent Results (from the past 240 hour(s))  MRSA PCR Screening     Status: None   Collection Time: 12/04/16 12:56 PM  Result Value Ref Range Status   MRSA by PCR NEGATIVE NEGATIVE Final    Comment:        The GeneXpert MRSA Assay (FDA approved for NASAL specimens only), is one component of a comprehensive MRSA colonization surveillance program. It is not intended to diagnose MRSA infection nor to guide or monitor treatment for MRSA infections.      Labs: BNP (last 3 results) No results for input(s): BNP in the last 8760 hours. Basic Metabolic Panel:  Recent Labs Lab 12/04/16 1038 12/05/16 0532 12/09/16 2334  NA  --  136 137  K  --  4.0 4.0  CL  --  102 101  CO2  --  25 24  GLUCOSE  --  99 111*  BUN  --  12 16  CREATININE  --  0.78 1.05  CALCIUM  --  9.3 10.1  MG 2.1  --   --    Liver Function Tests:  Recent Labs Lab 12/04/16 1038  12/09/16 2334  AST 27 18  ALT 31 22  ALKPHOS 72 67  BILITOT 0.4 0.5  PROT 6.7 6.7  ALBUMIN 3.6 3.9    Recent Labs Lab 12/09/16 2334  LIPASE 16   No results for input(s): AMMONIA in the last 168 hours. CBC:  Recent Labs Lab 12/05/16 0532 12/06/16 0322 12/09/16 2334  WBC 5.3 5.2 5.2  NEUTROABS  --   --  3.4  HGB 13.9 13.3 15.4  HCT 42.7 40.8 45.7  MCV 82.4 82.3 81.6  PLT 249 244 222   Cardiac Enzymes:  Recent Labs Lab 12/04/16 1038 12/04/16 1806 12/04/16 2327  TROPONINI 1.70* 1.60* 1.49*   BNP: Invalid input(s): POCBNP CBG:  Recent Labs Lab 12/11/16 0553  GLUCAP 80   D-Dimer No results for input(s): DDIMER in the last 72 hours. Hgb A1c No results for input(s): HGBA1C in the last 72 hours. Lipid Profile No results for input(s): CHOL, HDL, LDLCALC, TRIG, CHOLHDL, LDLDIRECT in the last 72 hours. Thyroid function studies No results for input(s): TSH, T4TOTAL, T3FREE, THYROIDAB in the last 72 hours.  Invalid input(s): FREET3 Anemia work up No results for input(s): VITAMINB12, FOLATE, FERRITIN, TIBC, IRON,  RETICCTPCT in the last 72 hours. Urinalysis    Component Value Date/Time   COLORURINE AMBER (A) 12/09/2016 2343   APPEARANCEUR HAZY (A) 12/09/2016 2343   LABSPEC 1.025 12/09/2016 2343   PHURINE 5.0 12/09/2016 2343   GLUCOSEU NEGATIVE 12/09/2016 2343   HGBUR NEGATIVE 12/09/2016 2343   BILIRUBINUR NEGATIVE 12/09/2016 2343   KETONESUR 20 (A) 12/09/2016 2343   PROTEINUR 100 (A) 12/09/2016 2343   UROBILINOGEN 1.0 05/20/2008 0024   NITRITE NEGATIVE 12/09/2016 2343   LEUKOCYTESUR NEGATIVE 12/09/2016 2343   Sepsis Labs Invalid input(s): PROCALCITONIN,  WBC,  LACTICIDVEN Microbiology Recent Results (from the past 240 hour(s))  MRSA PCR Screening     Status: None   Collection Time: 12/04/16 12:56 PM  Result Value Ref Range Status   MRSA by PCR NEGATIVE NEGATIVE Final    Comment:        The GeneXpert MRSA Assay (FDA approved for NASAL  specimens only), is one component of a comprehensive MRSA colonization surveillance program. It is not intended to diagnose MRSA infection nor to guide or monitor treatment for MRSA infections.      Time coordinating discharge: Over 30 minutes  SIGNED:   Birdie Hopes, MD  [facility] 12/11/2016, 10:34 AM Pager   If 7PM-7AM, please contact night-coverage [email or web address] Password [email or web address]

## 2016-12-11 NOTE — Progress Notes (Signed)
Ronald Robinson to be D/C'd  per MD order.  Discussed with the patient and all questions fully answered.  VSS, Skin clean, dry and intact without evidence of skin break down, no evidence of skin tears noted. IV catheter discontinued intact. Site without signs and symptoms of complications. Dressing and pressure applied.  An After Visit Summary was printed and given to the patient. Patient received prescription.  D/c education completed with patient/family including follow up instructions, medication list, d/c activities limitations if indicated, with other d/c instructions as indicated by Md, encouraged patient and wife to contact his cardiac MD prior to returning to work.  Patient able to verbalize understanding, all questions fully answered.   Patient instructed to return to ED, call 911, or call MD for any changes in condition.   Patient escorted via [place], and D/C home via private auto.  Milas Hock 12/11/2016 2:06 PM

## 2016-12-27 ENCOUNTER — Ambulatory Visit (INDEPENDENT_AMBULATORY_CARE_PROVIDER_SITE_OTHER): Payer: Medicare Other | Admitting: Family Medicine

## 2016-12-27 ENCOUNTER — Encounter: Payer: Self-pay | Admitting: Family Medicine

## 2016-12-27 VITALS — BP 120/58 | HR 70 | Temp 97.3°F | Ht 67.0 in | Wt 173.6 lb

## 2016-12-27 DIAGNOSIS — Z23 Encounter for immunization: Secondary | ICD-10-CM | POA: Diagnosis not present

## 2016-12-27 DIAGNOSIS — Z09 Encounter for follow-up examination after completed treatment for conditions other than malignant neoplasm: Secondary | ICD-10-CM

## 2016-12-27 LAB — BASIC METABOLIC PANEL
BUN: 16 mg/dL (ref 6–23)
CALCIUM: 9.9 mg/dL (ref 8.4–10.5)
CO2: 31 mEq/L (ref 19–32)
Chloride: 100 mEq/L (ref 96–112)
Creatinine, Ser: 0.94 mg/dL (ref 0.40–1.50)
GFR: 100.75 mL/min (ref >60.00)
Glucose, Bld: 128 mg/dL — ABNORMAL HIGH (ref 70–99)
Potassium: 4.1 mEq/L (ref 3.5–5.1)
SODIUM: 137 meq/L (ref 135–145)

## 2016-12-27 NOTE — Addendum Note (Signed)
Addended by: Harl Bowie on: 12/27/2016 12:01 PM   Modules accepted: Orders

## 2016-12-27 NOTE — Progress Notes (Signed)
Pre visit review using our clinic review tool, if applicable. No additional management support is needed unless otherwise documented below in the visit note. 

## 2016-12-27 NOTE — Patient Instructions (Signed)
Let us know if you are having issues getting in with your heart doctor or getting your medicine.

## 2016-12-27 NOTE — Progress Notes (Signed)
Chief Complaint  Patient presents with  . Establish Care    hosp follow-up       New Patient Visit SUBJECTIVE: HPI: Ronald Robinson is an 76 y.o.male who is being seen for establishing care.  The patient was recently discharged from [facility] for chest pain and new onset A fib with RVR. He was changed from Plavix to Eliquis and started on amiodarone. His wife has flight for financial assistance as it is expensive. The patient also has a history of recent non-ST segment elevated myocardial infarction. He is taking Imdur, as needed sublingual nitroglycerin, aspirin, Lipitor, and lisinopril. He is doing well all of his medicines and is not having any side effects. Since being discharged, he feels like his energy has returned and is ready to go back to work. He is requesting that I fill out a form clearing him. He works with [employer]. He is not having any chest pain or shortness of breath, nor is he having any palpitations or lightheadedness.  No Known Allergies  Past Medical History:  Diagnosis Date  . CAD in native artery  11/26/2016  . Enlarged prostate   . Hyperlipidemia LDL goal <70 12/04/2016  . Hypertension    Past Surgical History:  Procedure Laterality Date  . HERNIA REPAIR    . LEFT HEART CATH AND CORONARY ANGIOGRAPHY N/A 11/24/2016   Procedure: Left Heart Cath and Coronary Angiography;  Surgeon: Troy Sine, MD;  Location: [facility];  Service: Cardiovascular;  Laterality: N/A;   Social History   Social History  . Marital status: Married   Social History Main Topics  . Smoking status: Never Smoker  . Smokeless tobacco: Never Used  . Alcohol use Yes     Comment: rare beer  . Drug use: No   Family History  Problem Relation Age of Onset  . Coronary artery disease Father   . Coronary artery disease Sister   . Coronary artery disease Brother   . Hypertension Mother      Current Outpatient Prescriptions:  .  acetaminophen (TYLENOL) 325 MG tablet, Take 2  tablets (650 mg total) by mouth every 4 (four) hours as needed for headache or mild pain., Disp: 30 tablet, Rfl: 2 .  amiodarone (PACERONE) 200 MG tablet, Take 1 tablet (200 mg total) by mouth 2 (two) times daily., Disp: 60 tablet, Rfl: 5 .  apixaban (ELIQUIS) 5 MG TABS tablet, Take 1 tablet (5 mg total) by mouth 2 (two) times daily., Disp: 60 tablet, Rfl: 11 .  aspirin 81 MG tablet, Take 81 mg by mouth daily. , Disp: , Rfl:  .  atorvastatin (LIPITOR) 80 MG tablet, Take 1 tablet (80 mg total) by mouth daily at 6 PM., Disp: 30 tablet, Rfl: 6 .  finasteride (PROSCAR) 5 MG tablet, Take 5 mg by mouth daily., Disp: , Rfl:  .  isosorbide mononitrate (IMDUR) 60 MG 24 hr tablet, Take 1 tablet (60 mg total) by mouth daily., Disp: 30 tablet, Rfl: 5 .  lisinopril (PRINIVIL,ZESTRIL) 10 MG tablet, Take 1 tablet (10 mg total) by mouth daily., Disp: 30 tablet, Rfl: 1 .  Multiple Vitamin (MULTIVITAMIN) tablet, Take 1 tablet by mouth daily., Disp: , Rfl:  .  nitroGLYCERIN (NITROSTAT) 0.4 MG SL tablet, Place 1 tablet (0.4 mg total) under the tongue every 5 (five) minutes x 3 doses as needed for chest pain., Disp: 12 tablet, Rfl: 12 .  pantoprazole (PROTONIX) 40 MG tablet, Take 1 tablet (40 mg total) by mouth  daily., Disp: 30 tablet, Rfl: 5 .  tamsulosin (FLOMAX) 0.4 MG CAPS capsule, Take 0.4 mg by mouth daily. , Disp: , Rfl:   ROS Cardiovascular: Denies chest pain  Respiratory: Denies dyspnea   OBJECTIVE: BP (!) 120/58 (BP Location: Left Arm, Patient Position: Sitting, Cuff Size: Normal)   Pulse 70   Temp 97.3 F (36.3 C) (Oral)   Ht 5\' 7"  (1.702 m)   Wt 173 lb 9.6 oz (78.7 kg)   SpO2 98%   BMI 27.19 kg/m   Constitutional: -  VS reviewed -  Well developed, well nourished, appears stated age -  No apparent distress  Psychiatric: -  Oriented to person, place, and time -  Memory intact -  Affect and mood normal -  Fluent conversation, good eye contact -  Judgment and insight age appropriate  Eye: -   Conjunctivae clear, no discharge -  Medial portion of right eye shows some soft tissue fanning across the globe -  Pupils symmetric, round, reactive to light  ENMT: -  Oral mucosa without lesions, tongue and uvula midline    Tonsils not enlarged, no erythema, no exudate, trachea midline    Pharynx moist, no lesions, no erythema  Neck: -  No gross swelling, no palpable masses -  Thyroid midline, not enlarged, mobile, no palpable masses  Cardiovascular: -  RRR, no murmurs -  No LE edema  Respiratory: -  Normal respiratory effort, no accessory muscle use, no retraction -  Breath sounds equal, no wheezes, no ronchi, no crackles  Neurological:  -  CN II - XII grossly intact -  Sensation grossly intact to light touch, equal bilaterally  Musculoskeletal: -  No clubbing, no cyanosis -  Gait normal  Skin: -  No significant lesion on inspection -  Warm and dry to palpation   ASSESSMENT/PLAN: Hospital discharge follow-up - Plan: Basic Metabolic Panel (BMET)  Patient instructed to sign release of records form from his previous PCP. We'll follow up on labs per discharge summary. Urged him to keep appointment with cardiologist in 01/17/17. I did fill the form stating he can return to work as he is feeling better. If he starts having chest pain or shortness of breath, he is instructed to stop working and seek immediate evaluation. He has never received a pneumonia shot is due for a tetanus shot. Patient should return prn. The patient voiced understanding and agreement to the plan.   [place], DO 12/27/16  11:54 AM

## 2016-12-28 ENCOUNTER — Other Ambulatory Visit: Payer: Self-pay

## 2016-12-28 ENCOUNTER — Telehealth: Payer: Self-pay

## 2016-12-28 MED ORDER — APIXABAN 5 MG PO TABS
5.0000 mg | ORAL_TABLET | Freq: Two times a day (BID) | ORAL | 3 refills | Status: DC
Start: 2016-12-28 — End: 2016-12-28

## 2016-12-28 MED ORDER — APIXABAN 5 MG PO TABS: 5.0000 mg | ORAL_TABLET | Freq: Two times a day (BID) | ORAL | 3 refills | Status: DC

## 2016-12-28 NOTE — Telephone Encounter (Signed)
**Note De-Identified Katrisha Segall Obfuscation** The pt dropped off his part of the paperwork for [employer] assistance program for Eliquis.   He did not answer the question concerning how many people are in his household or total annual household income. He did provide a bank statement.  I called and asked the pt how many people are in his household and he stated 2. I asked him if the total of the 2 bank statements that he provided [place] was his combined annal income for the year 2017 and he stated yes but that his income is not expected to be as much this year.  I asked him if he has spent 3% of his annual income so far this year (2018) and he stated that he thinks he has and that he will have his wife call me back with that info.  Awaiting call back from the pts wife.

## 2016-12-28 NOTE — Telephone Encounter (Signed)
The pts wife, Marnette Burgess, called back and stated that she does not think that the pt has spent 3% on his medications so far this year.  She asked me to hold on to the pts [place] paperwork and that she will call back around the end of April as she thinks that he will have spent 3% of his income on his medications by then.  She is advised that I will keep his paperwork until they are ready for me to fax to [facility].

## 2016-12-30 ENCOUNTER — Ambulatory Visit: Payer: Medicare Other | Admitting: Physician Assistant

## 2017-01-03 ENCOUNTER — Telehealth (HOSPITAL_COMMUNITY): Payer: Self-pay | Admitting: Family Medicine

## 2017-01-03 NOTE — Telephone Encounter (Signed)
Verified Medicare A/B insurance benefits through [employer]. Reference [phone].... KJ

## 2017-01-16 NOTE — Progress Notes (Signed)
Cardiology Office Note   Date:  01/17/2017   ID:  Ronald Robinson, DOB 06-11-1941, MRN [id number]  PCP:  [name], DO    No chief complaint on file. CAD, AFib   Wt Readings from Last 3 Encounters:  01/17/17 174 lb (78.9 kg)  12/27/16 173 lb 9.6 oz (78.7 kg)  12/11/16 173 lb 6.4 oz (78.7 kg)       History of Present Illness: Ronald Robinson is a 76 y.o. male  with prior h/o HTN who was recently diagnosed with CAD. He initially presented to [facility] on 11/23/16 with a complaint of SSCP.  In the ED his EKG initially showed AF with CVR. He was unaware of palpitations or tachycardia. His Troponin was 1.34-2.13. His subsequent EKGs show subtle ST elevations and TWI. Pt was transferred to [facility] for cardiology admission. Patient had a cardiac cath using the right radial approach  which showed diffuse multivessel CAD involving the LAD, ramus intermediate, left circumflex, and RCA. EF normal at 55-60%. Pt not amenable to stent placement. CTS consulted and saw patient, they do not feel like CABG is the best treatment, his LAD system not graft able. The LCX and RCA systems are diffusely diseased and it is not possible to graft beyond the disease.  Dr. Cyndia Bent recommended medical therapy. He was placed on ASA, Plavix, Imdur and  Lisinopril.  Was readmitted on 2/24 with chest pain, but this time new Afib in RVR. Amiodarone was added and Plavix was changed to apixaban and he was discharged after two days.  In early March, he had blurry vision.  All he could see was the color brown.  Discharge diagnosis was "-Bilateral and complete loss of vision is typically a symptom of low blood pressure/pre-syncope, rather than CVA." -Started recently on Imdur, BB, Ranexa and amiodarone.  -CTA neck showed no evidence of severe vertebrobasilar disease -MR brain negative for acute CVA -Orthostatic as negative -Symptoms resolved, not a lot can be added from neurology standpoint, patient already on  Eliquis and LD of aspirin.  Stopped Ranexa.    No angina since that time in early March.  He has some left sided pain that resolves with Tylenol. No further visual issues.  He is back to work and doing well.     Past Medical History:  Diagnosis Date  . CAD in native artery  11/26/2016  . Enlarged prostate   . Hyperlipidemia LDL goal <70 12/04/2016  . Hypertension     Past Surgical History:  Procedure Laterality Date  . HERNIA REPAIR    . LEFT HEART CATH AND CORONARY ANGIOGRAPHY N/A 11/24/2016   Procedure: Left Heart Cath and Coronary Angiography;  Surgeon: Troy Sine, MD;  Location: [facility];  Service: Cardiovascular;  Laterality: N/A;     Current Outpatient Prescriptions  Medication Sig Dispense Refill  . acetaminophen (TYLENOL) 325 MG tablet Take 2 tablets (650 mg total) by mouth every 4 (four) hours as needed for headache or mild pain. 30 tablet 2  . amiodarone (PACERONE) 200 MG tablet Take 1 tablet (200 mg total) by mouth 2 (two) times daily. 60 tablet 5  . apixaban (ELIQUIS) 5 MG TABS tablet Take 1 tablet (5 mg total) by mouth 2 (two) times daily. 180 tablet 3  . aspirin 81 MG tablet Take 81 mg by mouth daily.     Marland Kitchen atorvastatin (LIPITOR) 80 MG tablet Take 1 tablet (80 mg total) by mouth daily at 6 PM. 30 tablet  6  . finasteride (PROSCAR) 5 MG tablet Take 5 mg by mouth daily.    . isosorbide mononitrate (IMDUR) 60 MG 24 hr tablet Take 1 tablet (60 mg total) by mouth daily. 30 tablet 5  . lisinopril (PRINIVIL,ZESTRIL) 10 MG tablet Take 1 tablet (10 mg total) by mouth daily. 30 tablet 1  . Multiple Vitamin (MULTIVITAMIN) tablet Take 1 tablet by mouth daily.    . nitroGLYCERIN (NITROSTAT) 0.4 MG SL tablet Place 1 tablet (0.4 mg total) under the tongue every 5 (five) minutes x 3 doses as needed for chest pain. 12 tablet 12  . pantoprazole (PROTONIX) 40 MG tablet Take 1 tablet (40 mg total) by mouth daily. 30 tablet 5  . tamsulosin (FLOMAX) 0.4 MG CAPS capsule Take 0.4  mg by mouth daily.      No current facility-administered medications for this visit.     Allergies:   Patient has no known allergies.    Social History:  The patient  reports that he has never smoked. He has never used smokeless tobacco. He reports that he drinks alcohol. He reports that he does not use drugs.   Family History:  The patient's family history includes Coronary artery disease in his brother, father, and sister; Hypertension in his mother.    ROS:  Please see the history of present illness.   Otherwise, review of systems are positive for occasional blurry vision.   All other systems are reviewed and negative.    PHYSICAL EXAM: VS:  BP 110/68   Pulse 62   Resp 16   Ht 5\' 7"  (1.702 m)   Wt 174 lb (78.9 kg)   SpO2 97%   BMI 27.25 kg/m  , BMI Body mass index is 27.25 kg/m. GEN: Well nourished, well developed, in no acute distress  HEENT: normal  Neck: no JVD, carotid bruits, or masses Cardiac: RRR; no murmurs, rubs, or gallops,no edema ; 2+ PT pulses bilaterally Respiratory:  clear to auscultation bilaterally, normal work of breathing GI: soft, nontender, nondistended, + BS MS: no deformity or atrophy  Skin: warm and dry, no rash Neuro:  Strength and sensation are intact Psych: euthymic mood, full affect    Recent Labs: 11/24/2016: TSH 1.911 12/04/2016: Magnesium 2.1 12/09/2016: ALT 22; Hemoglobin 15.4; Platelets 222 12/27/2016: BUN 16; Creatinine, Ser 0.94; Potassium 4.1; Sodium 137   Lipid Panel    Component Value Date/Time   CHOL 146 11/25/2016 0217   TRIG 34 11/25/2016 0217   HDL 49 11/25/2016 0217   CHOLHDL 3.0 11/25/2016 0217   VLDL 7 11/25/2016 0217   LDLCALC 90 11/25/2016 0217     Other studies Reviewed: Additional studies/ records that were reviewed today with results demonstrating: cath results.   ASSESSMENT AND PLAN:  1. CAD: No angina.  Continue aggressive secondary prevention.  He will need fasting labs in 6 months.   2. AFib: Continue  current meds.  Will need labs checked due to Amio use in 6 months.  Eliquis for stroke prevention. No bleeding problems at this time. 3. Hyperlipidemia: Continue current lipid lowering therapy.  Recheck lipids in 6 months. 4. Family history of heart disease: He has had 3 brothers died from heart attacks. 5. Hypertension: Controlled. Continue current blood pressure lowering therapy.   Current medicines are reviewed at length with the patient today.  The patient concerns regarding his medicines were addressed.  The following changes have been made:  No change  Labs/ tests ordered today include:  No orders of  the defined types were placed in this encounter.   Recommend 150 minutes/week of aerobic exercise Low fat, low carb, high fiber diet recommended  Disposition:   FU in 6 months   Signed, Larae Grooms, MD  01/17/2017 4:52 PM    [facility] [address], [place], [place]  [address] Phone: [phone]; Fax: [phone]

## 2017-01-17 ENCOUNTER — Ambulatory Visit (INDEPENDENT_AMBULATORY_CARE_PROVIDER_SITE_OTHER): Payer: PPO | Admitting: Interventional Cardiology

## 2017-01-17 ENCOUNTER — Encounter: Payer: Self-pay | Admitting: Interventional Cardiology

## 2017-01-17 VITALS — BP 110/68 | HR 62 | Resp 16 | Ht 67.0 in | Wt 174.0 lb

## 2017-01-17 DIAGNOSIS — Z8249 Family history of ischemic heart disease and other diseases of the circulatory system: Secondary | ICD-10-CM | POA: Diagnosis not present

## 2017-01-17 DIAGNOSIS — I2583 Coronary atherosclerosis due to lipid rich plaque: Secondary | ICD-10-CM

## 2017-01-17 DIAGNOSIS — I480 Paroxysmal atrial fibrillation: Secondary | ICD-10-CM

## 2017-01-17 DIAGNOSIS — I2510 Atherosclerotic heart disease of native coronary artery without angina pectoris: Secondary | ICD-10-CM | POA: Diagnosis not present

## 2017-01-17 DIAGNOSIS — I10 Essential (primary) hypertension: Secondary | ICD-10-CM

## 2017-01-17 DIAGNOSIS — E785 Hyperlipidemia, unspecified: Secondary | ICD-10-CM

## 2017-01-17 MED ORDER — APIXABAN 5 MG PO TABS: 5.0000 mg | ORAL_TABLET | Freq: Two times a day (BID) | ORAL | 3 refills | Status: DC

## 2017-01-17 MED ORDER — LISINOPRIL 10 MG PO TABS: 10.0000 mg | ORAL_TABLET | Freq: Every day | ORAL | 11 refills | Status: DC

## 2017-01-17 NOTE — Patient Instructions (Signed)
Medication Instructions:  Your physician recommends that you continue on your current medications as directed. Please refer to the Current Medication list given to you today.   Labwork: None  Testing/Procedures: None  Follow-Up: Your physician wants you to follow-up in: 6 months with Dr. Irish Lack. You will receive a reminder letter in the mail two months in advance. If you don't receive a letter, please call our office to schedule the follow-up appointment.   Any Other Special Instructions Will Be Listed Below (If Applicable).     If you need a refill on your cardiac medications before your next appointment, please call your pharmacy.

## 2017-01-20 ENCOUNTER — Emergency Department (HOSPITAL_BASED_OUTPATIENT_CLINIC_OR_DEPARTMENT_OTHER)
Admission: EM | Admit: 2017-01-20 | Discharge: 2017-01-20 | Disposition: A | Discharge status: Home / Self Care | Payer: Worker's Compensation | Attending: Emergency Medicine | Admitting: Emergency Medicine

## 2017-01-20 ENCOUNTER — Encounter (HOSPITAL_BASED_OUTPATIENT_CLINIC_OR_DEPARTMENT_OTHER): Payer: Self-pay | Admitting: *Deleted

## 2017-01-20 ENCOUNTER — Encounter (HOSPITAL_COMMUNITY): Payer: Self-pay | Admitting: Family Medicine

## 2017-01-20 DIAGNOSIS — Z79899 Other long term (current) drug therapy: Secondary | ICD-10-CM | POA: Diagnosis not present

## 2017-01-20 DIAGNOSIS — Y929 Unspecified place or not applicable: Secondary | ICD-10-CM | POA: Insufficient documentation

## 2017-01-20 DIAGNOSIS — Z7982 Long term (current) use of aspirin: Secondary | ICD-10-CM | POA: Diagnosis not present

## 2017-01-20 DIAGNOSIS — I2510 Atherosclerotic heart disease of native coronary artery without angina pectoris: Secondary | ICD-10-CM | POA: Insufficient documentation

## 2017-01-20 DIAGNOSIS — Y9389 Activity, other specified: Secondary | ICD-10-CM | POA: Insufficient documentation

## 2017-01-20 DIAGNOSIS — S61412A Laceration without foreign body of left hand, initial encounter: Secondary | ICD-10-CM | POA: Diagnosis not present

## 2017-01-20 DIAGNOSIS — W272XXA Contact with scissors, initial encounter: Secondary | ICD-10-CM | POA: Diagnosis not present

## 2017-01-20 DIAGNOSIS — Y999 Unspecified external cause status: Secondary | ICD-10-CM | POA: Diagnosis not present

## 2017-01-20 DIAGNOSIS — I10 Essential (primary) hypertension: Secondary | ICD-10-CM | POA: Diagnosis not present

## 2017-01-20 DIAGNOSIS — S6992XA Unspecified injury of left wrist, hand and finger(s), initial encounter: Secondary | ICD-10-CM | POA: Diagnosis present

## 2017-01-20 MED ORDER — LIDOCAINE HCL (PF) 1 % IJ SOLN
5.0000 mL | Freq: Once | INTRAMUSCULAR | Status: AC
  Administered 2017-01-20: 5 mL
  Filled 2017-01-20: qty 5

## 2017-01-20 NOTE — Progress Notes (Signed)
Mailed letter with our CRP to pt... KJ °

## 2017-01-20 NOTE — ED Triage Notes (Signed)
Pt reports puncture wound/laceration to left hand with scissors while at work today. Sent here for eval, dressing removed to reveal approx 2" laceration, no active bleeding noted. Pt states td is utd.

## 2017-01-20 NOTE — ED Notes (Signed)
Patient denies pain and is resting comfortably.  

## 2017-01-20 NOTE — ED Provider Notes (Signed)
[facility] Provider Note   CSN: [id number] Arrival date & time: 01/20/17  1025     History   Chief Complaint Chief Complaint  Patient presents with  . Hand Injury    HPI Ronald Robinson is a 76 y.o. male presenting with a 1.5 cm laceration to his left palm after reaching for his scissors in his pocket prior to arrival. Bleeding is controlled. He denies any numbness tingling or loss of range of motion. No other injuries or symptoms.  HPI  Past Medical History:  Diagnosis Date  . CAD in native artery  11/26/2016  . Enlarged prostate   . Hyperlipidemia LDL goal <70 12/04/2016  . Hypertension     Patient Active Problem List   Diagnosis Date Noted  . Complete loss of vision 12/10/2016  . Dizziness 12/10/2016  . Unstable angina ([place]) 12/04/2016  . Hyperlipidemia LDL goal <70 12/04/2016  . NSTEMI (non-ST elevated myocardial infarction) ([place])   . Coronary artery disease due to lipid rich plaque 11/26/2016  . S/P cardiac cath: Severe 3 vessel disease, not a candidate for CABG. Medical treatment 11/26/2016  . Non-ST elevation (NSTEMI) myocardial infarction ([employer]) 11/24/2016  . Family history of coronary artery disease 11/24/2016  . PAF (paroxysmal atrial fibrillation) ([place]) 11/24/2016  . BPH (benign prostatic hyperplasia) 11/24/2016  . Essential hypertension 01/12/2011  . KNEE PAIN, LEFT, ACUTE 01/12/2011  . PES ANSERINUS TENDINITIS OR BURSITIS 01/12/2011  . DYSPNEA 01/12/2011    Past Surgical History:  Procedure Laterality Date  . HERNIA REPAIR    . LEFT HEART CATH AND CORONARY ANGIOGRAPHY N/A 11/24/2016   Procedure: Left Heart Cath and Coronary Angiography;  Surgeon: Troy Sine, MD;  Location: [facility];  Service: Cardiovascular;  Laterality: N/A;       Home Medications    Prior to Admission medications   Medication Sig Start Date End Date Taking? Authorizing Provider  acetaminophen (TYLENOL) 325 MG tablet Take 2 tablets (650 mg total) by  mouth every 4 (four) hours as needed for headache or mild pain. 11/26/16   Tiffany Carlota Raspberry, PA-C  amiodarone (PACERONE) 200 MG tablet Take 1 tablet (200 mg total) by mouth 2 (two) times daily. 12/06/16   Brittainy Erie Noe, PA-C  apixaban (ELIQUIS) 5 MG TABS tablet Take 1 tablet (5 mg total) by mouth 2 (two) times daily. 01/17/17   Jettie Booze, MD  aspirin 81 MG tablet Take 81 mg by mouth daily.     Historical Provider, MD  atorvastatin (LIPITOR) 80 MG tablet Take 1 tablet (80 mg total) by mouth daily at 6 PM. 11/26/16   Delos Haring, PA-C  finasteride (PROSCAR) 5 MG tablet Take 5 mg by mouth daily.    Historical Provider, MD  isosorbide mononitrate (IMDUR) 60 MG 24 hr tablet Take 1 tablet (60 mg total) by mouth daily. 12/06/16   Brittainy Erie Noe, PA-C  lisinopril (PRINIVIL,ZESTRIL) 10 MG tablet Take 1 tablet (10 mg total) by mouth daily. 01/17/17   Jettie Booze, MD  Multiple Vitamin (MULTIVITAMIN) tablet Take 1 tablet by mouth daily.    Historical Provider, MD  nitroGLYCERIN (NITROSTAT) 0.4 MG SL tablet Place 1 tablet (0.4 mg total) under the tongue every 5 (five) minutes x 3 doses as needed for chest pain. 11/26/16   Tiffany Carlota Raspberry, PA-C  pantoprazole (PROTONIX) 40 MG tablet Take 1 tablet (40 mg total) by mouth daily. 12/07/16   Brittainy Erie Noe, PA-C  tamsulosin (FLOMAX) 0.4 MG CAPS capsule Take 0.4  mg by mouth daily.     Historical Provider, MD    Family History Family History  Problem Relation Age of Onset  . Coronary artery disease Father   . Coronary artery disease Sister   . Coronary artery disease Brother   . Hypertension Mother     Social History Social History  Substance Use Topics  . Smoking status: Never Smoker  . Smokeless tobacco: Never Used  . Alcohol use Yes     Comment: rare beer     Allergies   Patient has no known allergies.   Review of Systems Review of Systems  Constitutional: Negative for chills and fever.  HENT: Negative for sore throat.     Eyes: Negative for pain and visual disturbance.  Respiratory: Negative for cough, shortness of breath, wheezing and stridor.   Cardiovascular: Negative for chest pain and palpitations.  Gastrointestinal: Negative for abdominal pain, nausea and vomiting.  Musculoskeletal: Negative for arthralgias and neck stiffness.  Skin: Positive for wound. Negative for color change, pallor and rash.       Left palm laceration  Neurological: Negative for dizziness, tremors, seizures, syncope, weakness, light-headedness and numbness.     Physical Exam Updated Vital Signs BP 129/69 (BP Location: Right Arm)   Pulse (!) 55   Temp 97.7 F (36.5 C) (Oral)   Resp 18   Ht 5\' 11"  (1.803 m)   Wt 78.9 kg   SpO2 100%   BMI 24.27 kg/m   Physical Exam  Constitutional: He appears well-developed and well-nourished. No distress.  Afebrile, nontoxic-appearing, sleeping comfortably in bed in no acute distress.  HENT:  Head: Normocephalic and atraumatic.  Eyes: EOM are normal. Right eye exhibits no discharge. Left eye exhibits no discharge.  Neck: Normal range of motion.  Cardiovascular: Normal rate, regular rhythm, normal heart sounds and intact distal pulses.   Pulmonary/Chest: Effort normal. No respiratory distress. He has no wheezes.  Abdominal: He exhibits no distension.  Musculoskeletal: Normal range of motion. He exhibits no edema or deformity.  Full range of motion of the wrist and all fingers. 5 out of 5 resisted strength of flexion of the wrist, grips, flexion of the fingers at the DIP PIP and MCP.  Neurological: He is alert. No sensory deficit.  Skin: Skin is warm and dry. He is not diaphoretic. No erythema. No pallor.  Psychiatric: He has a normal mood and affect.  Nursing note and vitals reviewed.    ED Treatments / Results  Labs (all labs ordered are listed, but only abnormal results are displayed) Labs Reviewed - No data to display  EKG  EKG Interpretation None       Radiology No  results found.  Procedures Procedures (including critical care time) LACERATION REPAIR Performed by: Emeline General Authorized by: Emeline General Consent: Verbal consent obtained. Risks and benefits: risks, benefits and alternatives were discussed Consent given by: patient Patient identity confirmed: provided demographic data Prepped and Draped in normal sterile fashion Wound explored  Laceration Location: left palm  Laceration Length: 1.5cm  No Foreign Bodies seen or palpated  Anesthesia: local infiltration  Local anesthetic: lidocaine 1% w/o epinephrine  Anesthetic total: 2 ml  Irrigation method: syringe Amount of cleaning: standard  Skin closure: dermabond  Number of sutures: none  Technique: dermabond and steristrip  Patient tolerance: Patient tolerated the procedure well with no immediate complications.  SPLINT APPLICATION Date/Time: 2:99 PM Authorized by: Emeline General Consent: Verbal consent obtained. Risks and benefits: risks, benefits and  alternatives were discussed Consent given by: patient Splint applied by: orthopedic technician Location details: left hand Splint type: wrist Supplies used: velcro splint Post-procedure: The splinted body part was neurovascularly unchanged following the procedure. Patient tolerance: Patient tolerated the procedure well with no immediate complications.     Medications Ordered in ED Medications  lidocaine (PF) (XYLOCAINE) 1 % injection 5 mL (5 mLs Infiltration Given by Other 01/20/17 1316)     Initial Impression / Assessment and Plan / ED Course  I have reviewed the triage vital signs and the nursing notes.  Pertinent labs & imaging results that were available during my care of the patient were reviewed by me and considered in my medical decision making (see chart for details).     Patient presenting with left palm injury to reaching processes in his pocket. 1.5 cm laceration between the hyper  thenar and hyperthenar eminence. Superficial laceration, bleeding is well controlled. He has no other complaints or injury. Neurovascularly intact distally.  Patient reported up to date tetanus.  Pressure irrigation performed. Wound explored and base of wound visualized in a bloodless field without evidence of foreign body.  Laceration occurred < 8 hours prior to repair which was well tolerated. Tdap up to date per patient.  Pt has  no comorbidities to effect normal wound healing. Pt discharged without antibiotics.  Discussed home care with patient and answered questions. Pt to follow-up for wound check  in 7 days; they are to return to the ED sooner for signs of infection. Pt is hemodynamically stable with no complaints prior to dc.   Patient was discussed with Dr. Billy Fischer who has seen patient and recommends dermabond/steristrip closure.  Discussed strict return precautions and advised to return to the emergency department if experiencing any new or worsening symptoms. Instructions were understood and patient agreed with discharge plan. Final Clinical Impressions(s) / ED Diagnoses   Final diagnoses:  Injury of left hand, initial encounter    New Prescriptions New Prescriptions   No medications on file     Emeline General, Hershal Coria 01/20/17 [place], MD 01/21/17 [phone]

## 2017-01-20 NOTE — Discharge Instructions (Signed)
As discussed, please keep the area clean and dry for at least 24 hours. Monitor for any swelling, redness, warmth, purulent discharge, fever, or any other new concerning symptoms and be seen immediately if you experience those.  Follow-up with your primary care as needed.

## 2017-01-26 ENCOUNTER — Ambulatory Visit (INDEPENDENT_AMBULATORY_CARE_PROVIDER_SITE_OTHER): Payer: PPO | Admitting: Family Medicine

## 2017-01-26 ENCOUNTER — Encounter: Payer: Self-pay | Admitting: Family Medicine

## 2017-01-26 VITALS — BP 106/56 | HR 67 | Temp 98.1°F | Resp 16 | Ht 71.0 in | Wt 172.0 lb

## 2017-01-26 DIAGNOSIS — Z09 Encounter for follow-up examination after completed treatment for conditions other than malignant neoplasm: Secondary | ICD-10-CM

## 2017-01-26 NOTE — Patient Instructions (Addendum)
OK to get area wet. Do not vigorously scrub. Soap and water only. Keep clean and dry. Let steri-strips fall off on their own.  Keep wearing brace for the next week to protect the skin on your palm. Ok to ease back into normal activity after that. Continue to wear if no scab present.   Look out for fevers, redness (streaking), drainage or foul odor.

## 2017-01-26 NOTE — Progress Notes (Signed)
Chief Complaint  Patient presents with  . er follow up    left hand.    Subjective: Patient is a 76 y.o. male here for ED f/u.  On 4/12, stuck hand into pocket and cut L palm. Went to ED, placed in wrist splint (active at work). No redness, fevers, drainage, odor.   Family History  Problem Relation Age of Onset  . Coronary artery disease Father   . Coronary artery disease Sister   . Coronary artery disease Brother   . Hypertension Mother    Past Medical History:  Diagnosis Date  . CAD in native artery  11/26/2016  . Enlarged prostate   . Hyperlipidemia LDL goal <70 12/04/2016  . Hypertension    No Known Allergies  Current Outpatient Prescriptions:  .  acetaminophen (TYLENOL) 325 MG tablet, Take 2 tablets (650 mg total) by mouth every 4 (four) hours as needed for headache or mild pain., Disp: 30 tablet, Rfl: 2 .  amiodarone (PACERONE) 200 MG tablet, Take 1 tablet (200 mg total) by mouth 2 (two) times daily., Disp: 60 tablet, Rfl: 5 .  apixaban (ELIQUIS) 5 MG TABS tablet, Take 1 tablet (5 mg total) by mouth 2 (two) times daily., Disp: 180 tablet, Rfl: 3 .  aspirin 81 MG tablet, Take 81 mg by mouth daily. , Disp: , Rfl:  .  atorvastatin (LIPITOR) 80 MG tablet, Take 1 tablet (80 mg total) by mouth daily at 6 PM., Disp: 30 tablet, Rfl: 6 .  finasteride (PROSCAR) 5 MG tablet, Take 5 mg by mouth daily., Disp: , Rfl:  .  isosorbide mononitrate (IMDUR) 60 MG 24 hr tablet, Take 1 tablet (60 mg total) by mouth daily., Disp: 30 tablet, Rfl: 5 .  lisinopril (PRINIVIL,ZESTRIL) 10 MG tablet, Take 1 tablet (10 mg total) by mouth daily., Disp: 30 tablet, Rfl: 11 .  Multiple Vitamin (MULTIVITAMIN) tablet, Take 1 tablet by mouth daily., Disp: , Rfl:  .  nitroGLYCERIN (NITROSTAT) 0.4 MG SL tablet, Place 1 tablet (0.4 mg total) under the tongue every 5 (five) minutes x 3 doses as needed for chest pain., Disp: 12 tablet, Rfl: 12 .  pantoprazole (PROTONIX) 40 MG tablet, Take 1 tablet (40 mg total) by  mouth daily., Disp: 30 tablet, Rfl: 5 .  tamsulosin (FLOMAX) 0.4 MG CAPS capsule, Take 0.4 mg by mouth daily. , Disp: , Rfl:   Objective: BP (!) 106/56 (BP Location: Right Arm, Cuff Size: Normal)   Pulse 67   Temp 98.1 F (36.7 C) (Oral)   Resp 16   Ht 5\' 11"  (1.803 m)   Wt 172 lb (78 kg)   SpO2 97%   BMI 23.99 kg/m  General: Awake, appears stated age Skin: No erythema, edema, steri strips in place, 2.5 cm laceration, well approximated, no fluctuance or discharge Psych: Age appropriate judgment and insight, normal affect and mood  Assessment and Plan: Hospital discharge follow-up  Area of interest looks like it is healing well. OK to clean with soap and water, no vigorous scrubbing. Signs and symptoms suggestive of infection discussed with and written down for pt. Keep wearing brace for next year. F/u in 1 year for check up. The patient voiced understanding and agreement to the plan.  [place], DO 01/26/17  2:41 PM

## 2017-01-26 NOTE — Progress Notes (Signed)
Pre visit review using our clinic review tool, if applicable. No additional management support is needed unless otherwise documented below in the visit note. 

## 2017-01-28 ENCOUNTER — Telehealth (HOSPITAL_COMMUNITY): Payer: Self-pay | Admitting: Family Medicine

## 2017-01-28 NOTE — Telephone Encounter (Signed)
Pt's wife called states pt is working out from home and declined CRP... KJ

## 2017-02-03 ENCOUNTER — Emergency Department (HOSPITAL_BASED_OUTPATIENT_CLINIC_OR_DEPARTMENT_OTHER)
Admission: EM | Admit: 2017-02-03 | Discharge: 2017-02-03 | Disposition: A | Discharge status: Home / Self Care | Payer: PPO | Attending: Emergency Medicine | Admitting: Emergency Medicine

## 2017-02-03 ENCOUNTER — Encounter (HOSPITAL_BASED_OUTPATIENT_CLINIC_OR_DEPARTMENT_OTHER): Payer: Self-pay | Admitting: *Deleted

## 2017-02-03 DIAGNOSIS — Y999 Unspecified external cause status: Secondary | ICD-10-CM | POA: Diagnosis not present

## 2017-02-03 DIAGNOSIS — Y929 Unspecified place or not applicable: Secondary | ICD-10-CM | POA: Insufficient documentation

## 2017-02-03 DIAGNOSIS — Y939 Activity, unspecified: Secondary | ICD-10-CM | POA: Insufficient documentation

## 2017-02-03 DIAGNOSIS — I2510 Atherosclerotic heart disease of native coronary artery without angina pectoris: Secondary | ICD-10-CM | POA: Insufficient documentation

## 2017-02-03 DIAGNOSIS — Z79899 Other long term (current) drug therapy: Secondary | ICD-10-CM | POA: Insufficient documentation

## 2017-02-03 DIAGNOSIS — I10 Essential (primary) hypertension: Secondary | ICD-10-CM | POA: Diagnosis not present

## 2017-02-03 DIAGNOSIS — X58XXXA Exposure to other specified factors, initial encounter: Secondary | ICD-10-CM | POA: Insufficient documentation

## 2017-02-03 DIAGNOSIS — Z7982 Long term (current) use of aspirin: Secondary | ICD-10-CM | POA: Insufficient documentation

## 2017-02-03 DIAGNOSIS — S39012A Strain of muscle, fascia and tendon of lower back, initial encounter: Secondary | ICD-10-CM | POA: Diagnosis not present

## 2017-02-03 DIAGNOSIS — T148XXA Other injury of unspecified body region, initial encounter: Secondary | ICD-10-CM

## 2017-02-03 DIAGNOSIS — S3992XA Unspecified injury of lower back, initial encounter: Secondary | ICD-10-CM | POA: Diagnosis not present

## 2017-02-03 DIAGNOSIS — S39011A Strain of muscle, fascia and tendon of abdomen, initial encounter: Secondary | ICD-10-CM | POA: Diagnosis not present

## 2017-02-03 MED ORDER — TRAMADOL HCL 50 MG PO TABS: 50.0000 mg | ORAL_TABLET | Freq: Four times a day (QID) | ORAL | 0 refills | Status: DC | PRN

## 2017-02-03 MED FILL — traMADol HCL 50 MG TABS: 50 | 5 days supply | Qty: 20 | Fill #0

## 2017-02-03 NOTE — Discharge Instructions (Signed)
Trial of the tramadol to see if it helps with the right flank intermittent back pain. Follow-up with your doctor if no improvements or if things get worse.

## 2017-02-03 NOTE — ED Triage Notes (Signed)
Pt reports R lower back pain, particularly when moving, since last night. Denies radiation down leg. Denies fever, n/v/d, urinary symptoms, known injury.

## 2017-02-03 NOTE — ED Provider Notes (Signed)
[facility] Provider Note   CSN: [id number] Arrival date & time: 02/03/17  0737     History   Chief Complaint Chief Complaint  Patient presents with  . Back Pain    HPI Ronald Robinson is a 76 y.o. male.  Patient with a 2 day history of intermittent right flank pain made worse with rolling over in bed and getting out of bed. Laying still in bed has no discomfort. Patient's had similar things in the past without any definitive diagnosis. She still works. Does not bother him much during the day. No true back pain no true anterior abdominal pain no fevers no nausea no vomiting no rash. No known injury. Normally in the past helped with Tylenol but not helping this time. Patient actually was concerned about appendicitis this time.      Past Medical History:  Diagnosis Date  . CAD in native artery  11/26/2016  . Enlarged prostate   . Hyperlipidemia LDL goal <70 12/04/2016  . Hypertension     Patient Active Problem List   Diagnosis Date Noted  . Complete loss of vision 12/10/2016  . Dizziness 12/10/2016  . Unstable angina ([place]) 12/04/2016  . Hyperlipidemia LDL goal <70 12/04/2016  . NSTEMI (non-ST elevated myocardial infarction) ([place])   . Coronary artery disease due to lipid rich plaque 11/26/2016  . S/P cardiac cath: Severe 3 vessel disease, not a candidate for CABG. Medical treatment 11/26/2016  . Non-ST elevation (NSTEMI) myocardial infarction ([place]) 11/24/2016  . Family history of coronary artery disease 11/24/2016  . PAF (paroxysmal atrial fibrillation) ([place]) 11/24/2016  . BPH (benign prostatic hyperplasia) 11/24/2016  . Essential hypertension 01/12/2011  . KNEE PAIN, LEFT, ACUTE 01/12/2011  . PES ANSERINUS TENDINITIS OR BURSITIS 01/12/2011  . DYSPNEA 01/12/2011    Past Surgical History:  Procedure Laterality Date  . HERNIA REPAIR    . LEFT HEART CATH AND CORONARY ANGIOGRAPHY N/A 11/24/2016   Procedure: Left Heart Cath and Coronary Angiography;  Surgeon:  Troy Sine, MD;  Location: [facility];  Service: Cardiovascular;  Laterality: N/A;       Home Medications    Prior to Admission medications   Medication Sig Start Date End Date Taking? Authorizing Provider  acetaminophen (TYLENOL) 325 MG tablet Take 2 tablets (650 mg total) by mouth every 4 (four) hours as needed for headache or mild pain. 11/26/16  Yes Tiffany Carlota Raspberry, PA-C  amiodarone (PACERONE) 200 MG tablet Take 1 tablet (200 mg total) by mouth 2 (two) times daily. 12/06/16  Yes Brittainy Erie Noe, PA-C  apixaban (ELIQUIS) 5 MG TABS tablet Take 1 tablet (5 mg total) by mouth 2 (two) times daily. 01/17/17  Yes Jettie Booze, MD  aspirin 81 MG tablet Take 81 mg by mouth daily.    Yes Historical Provider, MD  atorvastatin (LIPITOR) 80 MG tablet Take 1 tablet (80 mg total) by mouth daily at 6 PM. 11/26/16  Yes Tiffany Carlota Raspberry, PA-C  finasteride (PROSCAR) 5 MG tablet Take 5 mg by mouth daily.   Yes Historical Provider, MD  isosorbide mononitrate (IMDUR) 60 MG 24 hr tablet Take 1 tablet (60 mg total) by mouth daily. 12/06/16  Yes Brittainy Erie Noe, PA-C  lisinopril (PRINIVIL,ZESTRIL) 10 MG tablet Take 1 tablet (10 mg total) by mouth daily. 01/17/17  Yes Jettie Booze, MD  Multiple Vitamin (MULTIVITAMIN) tablet Take 1 tablet by mouth daily.   Yes Historical Provider, MD  nitroGLYCERIN (NITROSTAT) 0.4 MG SL tablet Place 1 tablet (  0.4 mg total) under the tongue every 5 (five) minutes x 3 doses as needed for chest pain. 11/26/16  Yes Tiffany Carlota Raspberry, PA-C  pantoprazole (PROTONIX) 40 MG tablet Take 1 tablet (40 mg total) by mouth daily. 12/07/16  Yes Brittainy Erie Noe, PA-C  tamsulosin (FLOMAX) 0.4 MG CAPS capsule Take 0.4 mg by mouth daily.    Yes Historical Provider, MD  traMADol (ULTRAM) 50 MG tablet Take 1 tablet (50 mg total) by mouth every 6 (six) hours as needed. 02/03/17   Fredia Sorrow, MD    Family History Family History  Problem Relation Age of Onset  . Coronary  artery disease Father   . Coronary artery disease Sister   . Coronary artery disease Brother   . Hypertension Mother     Social History Social History  Substance Use Topics  . Smoking status: Never Smoker  . Smokeless tobacco: Never Used  . Alcohol use No     Allergies   Patient has no known allergies.   Review of Systems Review of Systems  Constitutional: Negative for fever.  Respiratory: Negative for shortness of breath.   Cardiovascular: Negative for chest pain.  Gastrointestinal: Negative for abdominal pain.  Genitourinary: Positive for flank pain. Negative for dysuria and hematuria.  Musculoskeletal: Negative for back pain.  Skin: Negative for rash.  Neurological: Negative for headaches.  Hematological: Does not bruise/bleed easily.  Psychiatric/Behavioral: Negative for confusion.     Physical Exam Updated Vital Signs BP 122/68 (BP Location: Right Arm)   Pulse (!) 51   Temp 97.7 F (36.5 C) (Oral)   Resp 16   Ht 5\' 11"  (1.803 m)   Wt 78 kg   SpO2 100%   BMI 23.99 kg/m   Physical Exam  Constitutional: He is oriented to person, place, and time. He appears well-developed and well-nourished. No distress.  HENT:  Head: Normocephalic and atraumatic.  Mouth/Throat: Oropharynx is clear and moist.  Eyes: Conjunctivae and EOM are normal. Pupils are equal, round, and reactive to light.  Neck: Normal range of motion. Neck supple.  Cardiovascular: Normal rate, regular rhythm and normal heart sounds.   Pulmonary/Chest: Effort normal and breath sounds normal.  Abdominal: Soft. Bowel sounds are normal. There is no tenderness.  Musculoskeletal: Normal range of motion. He exhibits no tenderness.  Neurological: He is alert and oriented to person, place, and time. No cranial nerve deficit. He exhibits normal muscle tone. Coordination normal.  Skin: Skin is warm. No rash noted. No erythema.  Nursing note and vitals reviewed.    ED Treatments / Results  Labs (all labs  ordered are listed, but only abnormal results are displayed) Labs Reviewed - No data to display  EKG  EKG Interpretation None       Radiology No results found.  Procedures Procedures (including critical care time)  Medications Ordered in ED Medications - No data to display   Initial Impression / Assessment and Plan / ED Course  I have reviewed the triage vital signs and the nursing notes.  Pertinent labs & imaging results that were available during my care of the patient were reviewed by me and considered in my medical decision making (see chart for details).     Symptoms seem to be consistent with a musculoskeletal type pain. Intermittent not constant. No concerns for kidney stone clinically no concern for an acute abdominal process at this time. Certainly no right upper quadrant or right lower quadrant pain or tenderness. Pain is intermittent and worse with certain  positions goes away with laying still. Patient's had similar pain in the past. Also has had similar pain in different locations. At one point in time was evaluated at [facility] for this several years ago without any significant findings.  We'll give a trial of some traveled all the take at bedtime when symptoms are more bothersome and if they persist we'll have him follow-up with his primary care doctor.  Patient was concerned about appendicitis this time however pain is not constant and there is no palpable tenderness. Clinically not consistent with appendicitis at all.  Patient is nontoxic no acute distress.  Final Clinical Impressions(s) / ED Diagnoses   Final diagnoses:  Muscle strain    New Prescriptions New Prescriptions   TRAMADOL (ULTRAM) 50 MG TABLET    Take 1 tablet (50 mg total) by mouth every 6 (six) hours as needed.     Fredia Sorrow, MD 02/03/17 [phone]

## 2017-02-07 ENCOUNTER — Ambulatory Visit (INDEPENDENT_AMBULATORY_CARE_PROVIDER_SITE_OTHER): Payer: PPO | Admitting: Family Medicine

## 2017-02-07 ENCOUNTER — Encounter: Payer: Self-pay | Admitting: Family Medicine

## 2017-02-07 ENCOUNTER — Telehealth: Payer: Self-pay | Admitting: Family Medicine

## 2017-02-07 VITALS — BP 130/72 | HR 57 | Temp 98.1°F | Ht 71.0 in | Wt 174.0 lb

## 2017-02-07 DIAGNOSIS — Z1211 Encounter for screening for malignant neoplasm of colon: Secondary | ICD-10-CM | POA: Diagnosis not present

## 2017-02-07 DIAGNOSIS — R109 Unspecified abdominal pain: Secondary | ICD-10-CM | POA: Diagnosis not present

## 2017-02-07 NOTE — Telephone Encounter (Signed)
Relation to pt: Joedy Eickhoff (spouse)  Call back number:[phone]  Reason for call:  [place],Bessie spouse called stating patient decline colon cancer screen with [facility] gastro

## 2017-02-07 NOTE — Patient Instructions (Addendum)
Heat (pad or rice pillow in microwave) over affected area, 10-15 minutes every 2-3 hours while awake.   If you do not hear anything about your referral in the next 1-2 weeks, call our office and ask for an update.

## 2017-02-07 NOTE — Progress Notes (Signed)
Chief Complaint  Patient presents with  . Follow-up    ED    Subjective: Patient is a 76 y.o. male here for ER f/u.  R side pain over couple years. No injury or change in activity. Heat has helped. He normally gets it at night in certain positions. He points to his right lower rib cage at approximately the posterior axillary line between rib 11 and 12. Tylenol and Tramadol he has used, tramadol has helped more than the latter. No red flag signs.   He is also asking about colon cancer screening. He has not had a colonoscopy in around 10 years. He states the last one was normal. He is asking about other ways to screen.  ROS: MSK: +side pain  Family History  Problem Relation Age of Onset  . Coronary artery disease Father   . Coronary artery disease Sister   . Coronary artery disease Brother   . Hypertension Mother    Past Medical History:  Diagnosis Date  . CAD in native artery  11/26/2016  . Enlarged prostate   . Hyperlipidemia LDL goal <70 12/04/2016  . Hypertension    No Known Allergies  Current Outpatient Prescriptions:  .  acetaminophen (TYLENOL) 325 MG tablet, Take 2 tablets (650 mg total) by mouth every 4 (four) hours as needed for headache or mild pain., Disp: 30 tablet, Rfl: 2 .  amiodarone (PACERONE) 200 MG tablet, Take 1 tablet (200 mg total) by mouth 2 (two) times daily., Disp: 60 tablet, Rfl: 5 .  apixaban (ELIQUIS) 5 MG TABS tablet, Take 1 tablet (5 mg total) by mouth 2 (two) times daily., Disp: 180 tablet, Rfl: 3 .  aspirin 81 MG tablet, Take 81 mg by mouth daily. , Disp: , Rfl:  .  atorvastatin (LIPITOR) 80 MG tablet, Take 1 tablet (80 mg total) by mouth daily at 6 PM., Disp: 30 tablet, Rfl: 6 .  finasteride (PROSCAR) 5 MG tablet, Take 5 mg by mouth daily., Disp: , Rfl:  .  isosorbide mononitrate (IMDUR) 60 MG 24 hr tablet, Take 1 tablet (60 mg total) by mouth daily., Disp: 30 tablet, Rfl: 5 .  lisinopril (PRINIVIL,ZESTRIL) 10 MG tablet, Take 1 tablet (10 mg total)  by mouth daily., Disp: 30 tablet, Rfl: 11 .  Multiple Vitamin (MULTIVITAMIN) tablet, Take 1 tablet by mouth daily., Disp: , Rfl:  .  nitroGLYCERIN (NITROSTAT) 0.4 MG SL tablet, Place 1 tablet (0.4 mg total) under the tongue every 5 (five) minutes x 3 doses as needed for chest pain., Disp: 12 tablet, Rfl: 12 .  pantoprazole (PROTONIX) 40 MG tablet, Take 1 tablet (40 mg total) by mouth daily., Disp: 30 tablet, Rfl: 5 .  tamsulosin (FLOMAX) 0.4 MG CAPS capsule, Take 0.4 mg by mouth daily. , Disp: , Rfl:  .  traMADol (ULTRAM) 50 MG tablet, Take 1 tablet (50 mg total) by mouth every 6 (six) hours as needed., Disp: 20 tablet, Rfl: 0  Objective: BP 130/72 (BP Location: Left Arm, Patient Position: Sitting, Cuff Size: Normal)   Pulse (!) 57   Temp 98.1 F (36.7 C) (Oral)   Ht 5\' 11"  (1.803 m)   Wt 174 lb (78.9 kg)   SpO2 96%   BMI 24.27 kg/m  General: Awake, appears stated age Heart: RRR, no murmurs Lungs: CTAB, no rales, wheezes or rhonchi. No accessory muscle use Abd: BS+, soft, NT, ND, no masses or organomegaly MSK: +TTP over posterior ax line at R11-12. 5/5 strength in LE's b/l. No TTP  over paraspinal musculature. Neuro:   Psych: Age appropriate judgment and insight, normal affect and mood  Assessment and Plan: Side pain  Screen for colon cancer - Plan: Ambulatory referral to Gastroenterology  Heat, Tylenol as needed. Likely the last colonoscopy he will need. F/u prn. The patient voiced understanding and agreement to the plan.  Greater than 15 minutes were spent face to face with the patient with greater than 50% of this time spent counseling on colon cancer screening options, screening cessation, the process the day before a colonoscopy and side pain treatment.    [place], DO 02/07/17  2:05 PM

## 2017-03-05 DIAGNOSIS — H547 Unspecified visual loss: Secondary | ICD-10-CM | POA: Diagnosis not present

## 2017-03-05 DIAGNOSIS — R079 Chest pain, unspecified: Secondary | ICD-10-CM | POA: Diagnosis not present

## 2017-03-05 DIAGNOSIS — R1011 Right upper quadrant pain: Secondary | ICD-10-CM | POA: Diagnosis not present

## 2017-03-05 DIAGNOSIS — I10 Essential (primary) hypertension: Secondary | ICD-10-CM | POA: Diagnosis not present

## 2017-03-05 DIAGNOSIS — S199XXA Unspecified injury of neck, initial encounter: Secondary | ICD-10-CM | POA: Diagnosis not present

## 2017-03-05 DIAGNOSIS — S0990XA Unspecified injury of head, initial encounter: Secondary | ICD-10-CM | POA: Diagnosis not present

## 2017-03-05 DIAGNOSIS — I252 Old myocardial infarction: Secondary | ICD-10-CM | POA: Diagnosis not present

## 2017-03-05 DIAGNOSIS — G319 Degenerative disease of nervous system, unspecified: Secondary | ICD-10-CM | POA: Diagnosis not present

## 2017-03-05 DIAGNOSIS — R109 Unspecified abdominal pain: Secondary | ICD-10-CM | POA: Diagnosis not present

## 2017-03-05 DIAGNOSIS — I2510 Atherosclerotic heart disease of native coronary artery without angina pectoris: Secondary | ICD-10-CM | POA: Diagnosis not present

## 2017-03-05 DIAGNOSIS — Z8249 Family history of ischemic heart disease and other diseases of the circulatory system: Secondary | ICD-10-CM | POA: Diagnosis not present

## 2017-03-05 DIAGNOSIS — Z7901 Long term (current) use of anticoagulants: Secondary | ICD-10-CM | POA: Diagnosis not present

## 2017-03-05 DIAGNOSIS — I480 Paroxysmal atrial fibrillation: Secondary | ICD-10-CM | POA: Diagnosis not present

## 2017-03-05 DIAGNOSIS — D1809 Hemangioma of other sites: Secondary | ICD-10-CM | POA: Diagnosis not present

## 2017-03-05 DIAGNOSIS — E785 Hyperlipidemia, unspecified: Secondary | ICD-10-CM | POA: Diagnosis not present

## 2017-03-05 DIAGNOSIS — D1803 Hemangioma of intra-abdominal structures: Secondary | ICD-10-CM | POA: Diagnosis not present

## 2017-03-05 DIAGNOSIS — W01198A Fall on same level from slipping, tripping and stumbling with subsequent striking against other object, initial encounter: Secondary | ICD-10-CM | POA: Diagnosis not present

## 2017-03-05 DIAGNOSIS — G8929 Other chronic pain: Secondary | ICD-10-CM | POA: Diagnosis not present

## 2017-03-06 DIAGNOSIS — S0990XA Unspecified injury of head, initial encounter: Secondary | ICD-10-CM | POA: Diagnosis not present

## 2017-03-06 DIAGNOSIS — S0993XA Unspecified injury of face, initial encounter: Secondary | ICD-10-CM | POA: Diagnosis not present

## 2017-03-06 DIAGNOSIS — S199XXA Unspecified injury of neck, initial encounter: Secondary | ICD-10-CM | POA: Diagnosis not present

## 2017-03-06 DIAGNOSIS — G4489 Other headache syndrome: Secondary | ICD-10-CM | POA: Diagnosis not present

## 2017-03-14 DIAGNOSIS — N138 Other obstructive and reflux uropathy: Secondary | ICD-10-CM | POA: Diagnosis not present

## 2017-03-14 DIAGNOSIS — N401 Enlarged prostate with lower urinary tract symptoms: Secondary | ICD-10-CM | POA: Diagnosis not present

## 2017-03-14 DIAGNOSIS — R972 Elevated prostate specific antigen [PSA]: Secondary | ICD-10-CM | POA: Diagnosis not present

## 2017-03-21 ENCOUNTER — Encounter: Payer: Self-pay | Admitting: Family Medicine

## 2017-03-21 ENCOUNTER — Ambulatory Visit (INDEPENDENT_AMBULATORY_CARE_PROVIDER_SITE_OTHER): Payer: PPO | Admitting: Family Medicine

## 2017-03-21 ENCOUNTER — Ambulatory Visit (HOSPITAL_BASED_OUTPATIENT_CLINIC_OR_DEPARTMENT_OTHER)
Admission: RE | Admit: 2017-03-21 | Discharge: 2017-03-21 | Disposition: A | Discharge status: Home / Self Care | Payer: PPO | Source: Ambulatory Visit | Attending: Family Medicine | Admitting: Family Medicine

## 2017-03-21 VITALS — BP 120/50 | HR 60 | Temp 98.2°F | Ht 71.0 in | Wt 169.2 lb

## 2017-03-21 DIAGNOSIS — R05 Cough: Secondary | ICD-10-CM | POA: Diagnosis not present

## 2017-03-21 DIAGNOSIS — R059 Cough, unspecified: Secondary | ICD-10-CM

## 2017-03-21 DIAGNOSIS — I700 Atherosclerosis of aorta: Secondary | ICD-10-CM | POA: Insufficient documentation

## 2017-03-21 MED ORDER — FEXOFENADINE HCL 180 MG PO TABS: 180.0000 mg | ORAL_TABLET | Freq: Every day | ORAL | 0 refills | Status: AC

## 2017-03-21 NOTE — Patient Instructions (Signed)
Cancel appointment if you are doing better.  See [place] sooner or seek immediate care if things worsen or if you have new symptoms.

## 2017-03-21 NOTE — Progress Notes (Signed)
Chief Complaint  Patient presents with  . Cough    product-white-x 3 weeks-pt states it feels like something is in his throat-3 weeks    Ronald Robinson here for Cough.  Duration: 3 weeks  Feels a tickle in his throat, will sometimes cough up mucus.  Associated symptoms: AM nasal congestion Denies: sinus pain, rhinorrhea, ear pain, ear drainage, sore throat, wheezing, shortness of breath, myalgia and fevers Treatment to date: none Sick contacts: No  +hx of reflux, takes protonix Does have allergies, not taking anything.  ROS:  Const: Denies fevers HEENT: As noted in HPI Lungs: No SOB, +cough   Past Medical History:  Diagnosis Date  . CAD in native artery  11/26/2016  . Enlarged prostate   . Hyperlipidemia LDL goal <70 12/04/2016  . Hypertension    Family History  Problem Relation Age of Onset  . Coronary artery disease Father   . Coronary artery disease Sister   . Coronary artery disease Brother   . Hypertension Mother     BP (!) 120/50 (BP Location: Left Arm, Patient Position: Sitting, Cuff Size: Normal)   Pulse 60   Temp 98.2 F (36.8 C) (Oral)   Ht 5\' 11"  (1.803 m)   Wt 169 lb 3.2 oz (76.7 kg)   SpO2 98%   BMI 23.60 kg/m  General: Awake, alert, appears stated age HEENT: AT, [place], ears patent b/l and TM's neg, nares patent w/o discharge, pharynx pink and without exudates, MMM Neck: No masses or asymmetry Heart: RRR, no murmurs, no bruits Lungs: CTAB, no accessory muscle use Psych: Age appropriate judgment and insight, normal mood and affect  Cough - Plan: DG Chest 2 View, fexofenadine (ALLEGRA) 180 MG tablet  Orders as above. CXR shows no pulmonary disease.  I did hear his cough during the exam. It sounded dry and related to an upper airway cough syndrome. Given his related allergy symptoms, will start Antihistamine and follow-up if no improvement. If no improvement, will consider pulmonary function tests versus changing ACE inhibitor. F/u in 10 days. Cancel  appt if she is doing better. Pt voiced understanding and agreement to the plan.  [place], DO 03/21/17 4:15 PM

## 2017-04-08 DIAGNOSIS — H524 Presbyopia: Secondary | ICD-10-CM | POA: Diagnosis not present

## 2017-04-08 DIAGNOSIS — H5203 Hypermetropia, bilateral: Secondary | ICD-10-CM | POA: Diagnosis not present

## 2017-04-08 DIAGNOSIS — H52223 Regular astigmatism, bilateral: Secondary | ICD-10-CM | POA: Diagnosis not present

## 2017-04-09 DIAGNOSIS — W228XXA Striking against or struck by other objects, initial encounter: Secondary | ICD-10-CM | POA: Diagnosis not present

## 2017-04-09 DIAGNOSIS — I10 Essential (primary) hypertension: Secondary | ICD-10-CM | POA: Diagnosis not present

## 2017-04-09 DIAGNOSIS — E785 Hyperlipidemia, unspecified: Secondary | ICD-10-CM | POA: Diagnosis not present

## 2017-04-09 DIAGNOSIS — I2510 Atherosclerotic heart disease of native coronary artery without angina pectoris: Secondary | ICD-10-CM | POA: Diagnosis not present

## 2017-04-09 DIAGNOSIS — M542 Cervicalgia: Secondary | ICD-10-CM | POA: Diagnosis not present

## 2017-04-09 DIAGNOSIS — I252 Old myocardial infarction: Secondary | ICD-10-CM | POA: Diagnosis not present

## 2017-04-09 DIAGNOSIS — I480 Paroxysmal atrial fibrillation: Secondary | ICD-10-CM | POA: Diagnosis not present

## 2017-04-18 DIAGNOSIS — M19012 Primary osteoarthritis, left shoulder: Secondary | ICD-10-CM | POA: Diagnosis not present

## 2017-04-18 DIAGNOSIS — M4150 Other secondary scoliosis, site unspecified: Secondary | ICD-10-CM | POA: Diagnosis not present

## 2017-05-02 DIAGNOSIS — M4135 Thoracogenic scoliosis, thoracolumbar region: Secondary | ICD-10-CM | POA: Diagnosis not present

## 2017-05-02 DIAGNOSIS — M4150 Other secondary scoliosis, site unspecified: Secondary | ICD-10-CM | POA: Diagnosis not present

## 2017-05-02 DIAGNOSIS — M25512 Pain in left shoulder: Secondary | ICD-10-CM | POA: Diagnosis not present

## 2017-05-02 DIAGNOSIS — M4186 Other forms of scoliosis, lumbar region: Secondary | ICD-10-CM | POA: Diagnosis not present

## 2017-05-24 ENCOUNTER — Other Ambulatory Visit: Payer: Self-pay | Admitting: Cardiology

## 2017-06-12 ENCOUNTER — Other Ambulatory Visit: Payer: Self-pay | Admitting: Cardiology

## 2017-06-14 ENCOUNTER — Telehealth: Payer: Self-pay | Admitting: Family Medicine

## 2017-06-14 MED ORDER — AMIODARONE HCL 200 MG PO TABS: 200.0000 mg | ORAL_TABLET | Freq: Two times a day (BID) | ORAL | 5 refills | Status: DC

## 2017-06-14 NOTE — Telephone Encounter (Signed)
Relation to pt: self Call back [place]: [place]ly, [place] [phone] (Phone) [phone] (Fax)     Reason for call:  Patient requesting amiodarone (PACERONE) 200 MG tablet refill, please advise

## 2017-06-14 NOTE — Telephone Encounter (Signed)
Sent in prescription/patient notified

## 2017-06-16 DIAGNOSIS — R202 Paresthesia of skin: Secondary | ICD-10-CM | POA: Diagnosis not present

## 2017-06-16 DIAGNOSIS — R05 Cough: Secondary | ICD-10-CM | POA: Diagnosis not present

## 2017-06-16 DIAGNOSIS — E669 Obesity, unspecified: Secondary | ICD-10-CM | POA: Diagnosis not present

## 2017-06-16 DIAGNOSIS — R590 Localized enlarged lymph nodes: Secondary | ICD-10-CM | POA: Diagnosis not present

## 2017-06-16 DIAGNOSIS — I10 Essential (primary) hypertension: Secondary | ICD-10-CM | POA: Diagnosis not present

## 2017-06-16 DIAGNOSIS — Z87891 Personal history of nicotine dependence: Secondary | ICD-10-CM | POA: Diagnosis not present

## 2017-06-16 DIAGNOSIS — Z79899 Other long term (current) drug therapy: Secondary | ICD-10-CM | POA: Diagnosis not present

## 2017-06-17 DIAGNOSIS — R05 Cough: Secondary | ICD-10-CM | POA: Diagnosis not present

## 2017-06-21 ENCOUNTER — Telehealth: Payer: Self-pay | Admitting: Family Medicine

## 2017-06-21 NOTE — Telephone Encounter (Signed)
[facility] - Client  [facility]     Patient Name: Ronald Robinson [facility] - Client    Client Site [facility] - Day    Physician AA - PHYSICIAN, Verita Schneiders- MD    Contact Type Call    Who Is Calling Patient / Member / Family / Caregiver    Call Type Triage / Clinical    Caller Name Cordarrius Coad    Relationship To Patient Spouse    Return Phone Number [phone] (Primary)  Gender: Male Chief Complaint Coughing Up Blood  DOB: June 25, 1941  Reason for Call Symptomatic / Request for Health Information  Age: 76 Y 4 D Initial Comment Caller states he has a bad cough and occasionally he spits up blood.  Return Phone Number: [phone] (Primary), [phone] (Secondary) Additional Comment Dr [name]   Address:  PreDisposition Go to ED  City/State/Zip: [place] [address] Translation No    Nurse Assessment  Nurse: Venetia Maxon, RN, Manuela Schwartz Date/Time (Eastern Time): 06/21/2017 4:42:21 PM  Confirm and document reason for call. If symptomatic, describe symptoms. ---Caller states he has a bad cough and occasionally he spits up blood ( today) . symptoms began Friday. On blood thinner . He is on Eliquis. no thermometer.   Does the patient have any new or worsening symptoms? ---Yes   Will a triage be completed? ---Yes   Related visit to physician within the last 2 weeks? ---No   Does the PT have any chronic conditions? (i.e. diabetes, asthma, etc.) ---Yes   List chronic conditions. ---Afib Eliquis. took him to ED Friday night . sinuses HTN, cholesterol , arthritis , BPH he takes one baby ASA   Is this a behavioral health or substance abuse call? ---No     Guidelines      Guideline Title Affirmed Question Affirmed Notes Nurse Date/Time (Eastern Time)  Coughing Up Blood Taking Coumadin (warfarin) or other strong blood thinner, or known bleeding disorder (e.g.,  thrombocytopenia)  Venetia Maxon, RN, Manuela Schwartz 06/21/2017 4:44:36 PM  Disp. Time Eilene Ghazi Time) Disposition Final User         06/21/2017 4:48:28 PM See Physician within 24 Hours Yes Venetia Maxon, RN, Edwena Bunde Understands: Yes   Disagree/Comply: Comply      Care Advice Given Per Guideline         SEE PHYSICIAN WITHIN 24 HOURS: COUMADIN: Coumadin is a blood thinner and if the dose is too high bleeding problems can occur. You should see your physician and will probably need to get a blood test. (Reason: check Protime and INR). COUGHING SPASMS: Drink warm fluids. Inhale warm mist. (Reason: both relax the airway and loosen up the phlegm) Suck on cough drops or hard candy to coat the irritated throat. CALL BACK IF: * You become worse. * Difficulty breathing * You cough up more than a tablespoon of pure red blood CARE ADVICE given per Coughing Up Blood guideline.             Referrals   REFERRED TO PCP OFFICE   REFERRED TO PCP OFFICE

## 2017-06-22 ENCOUNTER — Encounter: Payer: Self-pay | Admitting: Family Medicine

## 2017-06-22 ENCOUNTER — Telehealth: Payer: Self-pay

## 2017-06-22 ENCOUNTER — Ambulatory Visit (INDEPENDENT_AMBULATORY_CARE_PROVIDER_SITE_OTHER): Payer: PPO | Admitting: Family Medicine

## 2017-06-22 VITALS — BP 112/58 | HR 58 | Temp 98.4°F | Ht 71.0 in | Wt 171.6 lb

## 2017-06-22 DIAGNOSIS — B9789 Other viral agents as the cause of diseases classified elsewhere: Secondary | ICD-10-CM | POA: Diagnosis not present

## 2017-06-22 DIAGNOSIS — J069 Acute upper respiratory infection, unspecified: Secondary | ICD-10-CM | POA: Diagnosis not present

## 2017-06-22 NOTE — Telephone Encounter (Signed)
Follow up call made to patient regarding [facility] call. Patient has an appointment scheduled for today.

## 2017-06-22 NOTE — Patient Instructions (Addendum)
OK to use cough drops if helpful.   OK to try honey to help coat the throat.   Continue to push fluids, practice good hand hygiene, and cover your mouth if you cough.  If you start having fevers, shaking or shortness of breath, seek immediate care.  Stay on aspirin for now.  Let us know if you need anything or if anything changes. Marland Kitchen

## 2017-06-22 NOTE — Progress Notes (Signed)
Chief Complaint  Patient presents with  . Acute Visit    Pt is c/o on going cough since friday and yesterday he noticed some blood mixed in his mucus, Denies wheezing,chest tightness     Ronald Robinson here for URI complaints.  Duration: 5 days  Associated symptoms: sinus congestion and dry cough; started coughing up blood-tinged mucus yesterday Denies: sinus pain, rhinorrhea, itchy watery eyes, ear pain, ear drainage, sore throat, wheezing, shortness of breath, myalgia and fevers/rigors; denies any other areas of easy bruising/bleeding Treatment to date: Pine needle remedy- helping overall, cough drops Sick contacts: No  ROS:  Const: Denies fevers HEENT: As noted in HPI Lungs: No SOB  Past Medical History:  Diagnosis Date  . CAD in native artery  11/26/2016  . Enlarged prostate   . Hyperlipidemia LDL goal <70 12/04/2016  . Hypertension    Family History  Problem Relation Age of Onset  . Coronary artery disease Father   . Coronary artery disease Sister   . Coronary artery disease Brother   . Hypertension Mother     BP (!) 112/58 (BP Location: Left Arm, Patient Position: Sitting, Cuff Size: Normal)   Pulse (!) 58   Temp 98.4 F (36.9 C) (Oral)   Ht 5\' 11"  (1.803 m)   Wt 171 lb 9.6 oz (77.8 kg)   SpO2 96%   BMI 23.93 kg/m  General: Awake, alert, appears stated age HEENT: AT, [place], ears patent b/l and TM's neg, nares patent w/o discharge, pharynx pink and without exudates, MMM Neck: No masses or asymmetry Heart: RRR, no murmurs, no bruits Lungs: CTAB, no accessory muscle use Psych: Age appropriate judgment and insight, normal mood and affect  Viral URI with cough  Cont home remedy, cough drops if helpful. Explained that blood tinged mucus is seen from time to time after coughing, he is at higher risk due to being on ASA.  Continue to push fluids, practice good hand hygiene, cover mouth when coughing. F/u prn. If starting to experience fevers, shaking, or shortness of  breath, seek immediate care. Let us know if this fails to improve or gets worse.  Pt voiced understanding and agreement to the plan.  [place], DO 06/22/17 1:32 PM

## 2017-06-30 ENCOUNTER — Other Ambulatory Visit: Payer: Self-pay | Admitting: Cardiology

## 2017-07-06 ENCOUNTER — Encounter: Payer: Self-pay | Admitting: Family Medicine

## 2017-07-06 ENCOUNTER — Ambulatory Visit (HOSPITAL_BASED_OUTPATIENT_CLINIC_OR_DEPARTMENT_OTHER)
Admission: RE | Admit: 2017-07-06 | Discharge: 2017-07-06 | Disposition: A | Discharge status: Home / Self Care | Payer: PPO | Source: Ambulatory Visit | Attending: Family Medicine | Admitting: Family Medicine

## 2017-07-06 ENCOUNTER — Ambulatory Visit (INDEPENDENT_AMBULATORY_CARE_PROVIDER_SITE_OTHER): Payer: PPO | Admitting: Family Medicine

## 2017-07-06 VITALS — BP 120/64 | HR 51 | Temp 98.0°F | Ht 67.0 in | Wt 169.2 lb

## 2017-07-06 DIAGNOSIS — M799 Soft tissue disorder, unspecified: Secondary | ICD-10-CM

## 2017-07-06 DIAGNOSIS — M7989 Other specified soft tissue disorders: Secondary | ICD-10-CM

## 2017-07-06 DIAGNOSIS — Z1211 Encounter for screening for malignant neoplasm of colon: Secondary | ICD-10-CM | POA: Diagnosis not present

## 2017-07-06 NOTE — Patient Instructions (Addendum)
Lipoma A lipoma is a noncancerous (benign) tumor that is made up of fat cells. This is a very common type of soft-tissue growth. Lipomas are usually found under the skin (subcutaneous). They may occur in any tissue of the body that contains fat. Common areas for lipomas to appear include the back, shoulders, buttocks, and thighs. Lipomas grow slowly, and they are usually painless. Most lipomas do not cause problems and do not require treatment. What are the causes? The cause of this condition is not known. What increases the risk? This condition is more likely to develop in:  People who are 40-60 years old.  People who have a family history of lipomas.  What are the signs or symptoms? A lipoma usually appears as a small, round bump under the skin. It may feel soft or rubbery, but the firmness can vary. Most lipomas are not painful. However, a lipoma may become painful if it is located in an area where it pushes on nerves. How is this diagnosed? A lipoma can usually be diagnosed with a physical exam. You may also have tests to confirm the diagnosis and to rule out other conditions. Tests may include:  Imaging tests, such as a CT scan or MRI.  Removal of a tissue sample to be looked at under a microscope (biopsy).  How is this treated? Treatment is not needed for small lipomas that are not causing problems. If a lipoma continues to get bigger or it causes problems, removal is often the best option. Lipomas can also be removed to improve appearance. Removal of a lipoma is usually done with a surgery in which the fatty cells and the surrounding capsule are removed. Most often, a medicine that numbs the area (local anesthetic) is used for this procedure. Follow these instructions at home:  Keep all follow-up visits as directed by your health care provider. This is important. Contact a health care provider if:  Your lipoma becomes larger or hard.  Your lipoma becomes painful, red, or  increasingly swollen. These could be signs of infection or a more serious condition. This information is not intended to replace advice given to you by your health care provider. Make sure you discuss any questions you have with your health care provider. Document Released: 09/17/2002 Document Revised: 03/04/2016 Document Reviewed: 09/23/2014 Elsevier Interactive Patient Education  2018 [employer]  

## 2017-07-06 NOTE — Progress Notes (Signed)
Chief Complaint  Patient presents with  . Cyst    right hip    Ronald Robinson is a 76 y.o. male here for a skin complaint.  Duration: 2 days, noticed it after some pain Location: R hip Pruritic? No Painful? Yes Drainage? No No skin changes over area.  ROS:  MSK: No weakness Skin: As noted in HPI  Past Medical History:  Diagnosis Date  . CAD in native artery  11/26/2016  . Enlarged prostate   . Hyperlipidemia LDL goal <70 12/04/2016  . Hypertension    No Known Allergies Allergies as of 07/06/2017   No Known Allergies     Medication List       Accurate as of 07/06/17  8:23 AM. Always use your most recent med list.          acetaminophen 325 MG tablet Commonly known as:  TYLENOL Take 2 tablets (650 mg total) by mouth every 4 (four) hours as needed for headache or mild pain.   amiodarone 200 MG tablet Commonly known as:  PACERONE Take 1 tablet (200 mg total) by mouth 2 (two) times daily.   apixaban 5 MG Tabs tablet Commonly known as:  ELIQUIS Take 1 tablet (5 mg total) by mouth 2 (two) times daily.   aspirin 81 MG tablet Take 81 mg by mouth daily.   atorvastatin 80 MG tablet Commonly known as:  LIPITOR Take 1 tablet (80 mg total) by mouth daily at 6 PM.   fexofenadine 180 MG tablet Commonly known as:  ALLEGRA Take 1 tablet (180 mg total) by mouth daily.   finasteride 5 MG tablet Commonly known as:  PROSCAR Take 5 mg by mouth daily.   isosorbide mononitrate 60 MG 24 hr tablet Commonly known as:  IMDUR TAKE 1 TABLET BY MOUTH ONCE DAILY   lisinopril 10 MG tablet Commonly known as:  PRINIVIL,ZESTRIL Take 1 tablet (10 mg total) by mouth daily.   multivitamin tablet Take 1 tablet by mouth daily.   nitroGLYCERIN 0.4 MG SL tablet Commonly known as:  NITROSTAT Place 1 tablet (0.4 mg total) under the tongue every 5 (five) minutes x 3 doses as needed for chest pain.   pantoprazole 40 MG tablet Commonly known as:  PROTONIX TAKE 1 TABLET BY MOUTH ONCE  DAILY   tamsulosin 0.4 MG Caps capsule Commonly known as:  FLOMAX Take 0.4 mg by mouth daily.            Discharge Care Instructions        Start     Ordered   07/06/17 0000  [place] RT LOWER EXTREM LTD SOFT TISSUE NON VASCULAR    Question Answer Comment  Reason for Exam (SYMPTOM  OR DIAGNOSIS REQUIRED) R buttock area mass   Preferred imaging location? [facility]      07/06/17 0816   07/06/17 0000  Fecal occult blood, imunochemical     07/06/17 0821      BP 120/64 (BP Location: Left Arm, Patient Position: Sitting, Cuff Size: Normal)   Pulse (!) 51   Temp 98 F (36.7 C) (Oral)   Ht 5\' 7"  (1.702 m)   Wt 169 lb 4 oz (76.8 kg)   SpO2 97%   BMI 26.51 kg/m  Gen: awake, alert, appearing stated age Lungs: No accessory muscle use Skin: over the R buttock, there is a 4 cm x 5 cm soft tissue mass; it is freely moveable and with smooth borders. No drainage, erythema, TTP, fluctuance, excoriation Psych: Age  appropriate judgment and insight  Soft tissue mass - Plan: [place] RT LOWER EXTREM LTD SOFT TISSUE NON VASCULAR  Screen for colon cancer - Plan: Fecal occult blood, imunochemical  Orders as above. Explained that I don't think this is a sinister issue given exam findings. Offered to refer to gen surg vs doing nothing vs further imaging, he opted for the latter.  FIT for ccs, refuses colonoscopy. F/u pending above. The patient voiced understanding and agreement to the plan.  [place], DO 07/06/17 8:23 AM

## 2017-07-06 NOTE — Progress Notes (Signed)
Pre visit review using our clinic review tool, if applicable. No additional management support is needed unless otherwise documented below in the visit note. 

## 2017-07-07 ENCOUNTER — Emergency Department (HOSPITAL_BASED_OUTPATIENT_CLINIC_OR_DEPARTMENT_OTHER): Payer: PPO

## 2017-07-07 ENCOUNTER — Encounter (HOSPITAL_BASED_OUTPATIENT_CLINIC_OR_DEPARTMENT_OTHER): Payer: Self-pay | Admitting: *Deleted

## 2017-07-07 ENCOUNTER — Other Ambulatory Visit (INDEPENDENT_AMBULATORY_CARE_PROVIDER_SITE_OTHER): Payer: PPO

## 2017-07-07 ENCOUNTER — Other Ambulatory Visit: Payer: Self-pay | Admitting: Family Medicine

## 2017-07-07 ENCOUNTER — Emergency Department (HOSPITAL_BASED_OUTPATIENT_CLINIC_OR_DEPARTMENT_OTHER)
Admission: EM | Admit: 2017-07-07 | Discharge: 2017-07-07 | Disposition: A | Discharge status: Home / Self Care | Payer: PPO | Attending: Emergency Medicine | Admitting: Emergency Medicine

## 2017-07-07 DIAGNOSIS — W25XXXA Contact with sharp glass, initial encounter: Secondary | ICD-10-CM | POA: Diagnosis not present

## 2017-07-07 DIAGNOSIS — M799 Soft tissue disorder, unspecified: Secondary | ICD-10-CM

## 2017-07-07 DIAGNOSIS — Y929 Unspecified place or not applicable: Secondary | ICD-10-CM | POA: Insufficient documentation

## 2017-07-07 DIAGNOSIS — S61214A Laceration without foreign body of right ring finger without damage to nail, initial encounter: Secondary | ICD-10-CM | POA: Insufficient documentation

## 2017-07-07 DIAGNOSIS — I2510 Atherosclerotic heart disease of native coronary artery without angina pectoris: Secondary | ICD-10-CM | POA: Insufficient documentation

## 2017-07-07 DIAGNOSIS — Y939 Activity, unspecified: Secondary | ICD-10-CM | POA: Insufficient documentation

## 2017-07-07 DIAGNOSIS — S61219A Laceration without foreign body of unspecified finger without damage to nail, initial encounter: Secondary | ICD-10-CM

## 2017-07-07 DIAGNOSIS — S61209A Unspecified open wound of unspecified finger without damage to nail, initial encounter: Secondary | ICD-10-CM

## 2017-07-07 DIAGNOSIS — Z79899 Other long term (current) drug therapy: Secondary | ICD-10-CM | POA: Diagnosis not present

## 2017-07-07 DIAGNOSIS — Z7901 Long term (current) use of anticoagulants: Secondary | ICD-10-CM | POA: Diagnosis not present

## 2017-07-07 DIAGNOSIS — Y999 Unspecified external cause status: Secondary | ICD-10-CM | POA: Insufficient documentation

## 2017-07-07 DIAGNOSIS — M7989 Other specified soft tissue disorders: Secondary | ICD-10-CM

## 2017-07-07 DIAGNOSIS — Z7982 Long term (current) use of aspirin: Secondary | ICD-10-CM | POA: Diagnosis not present

## 2017-07-07 DIAGNOSIS — S60945A Unspecified superficial injury of left ring finger, initial encounter: Secondary | ICD-10-CM | POA: Diagnosis not present

## 2017-07-07 DIAGNOSIS — I10 Essential (primary) hypertension: Secondary | ICD-10-CM | POA: Insufficient documentation

## 2017-07-07 DIAGNOSIS — S6991XA Unspecified injury of right wrist, hand and finger(s), initial encounter: Secondary | ICD-10-CM | POA: Diagnosis present

## 2017-07-07 NOTE — Progress Notes (Signed)
Spoke with on-call radiologist to clarify read did not include bladder and clarified correct order for f/u image.

## 2017-07-07 NOTE — ED Triage Notes (Signed)
Pt reports he got glass in right hand from broken mirror yesterday. Lac present right ring finger with no bleeding

## 2017-07-07 NOTE — ED Provider Notes (Signed)
[facility] DEPT [facility] Provider Note   CSN: [id number] Arrival date & time: 07/07/17  0857     History   Chief Complaint Chief Complaint  Patient presents with  . Laceration    HPI Ronald Robinson is a 76 y.o. male who presents for Cut to the R ring finger. Yesterday he cut it on a shattered mirror. He was concerned that he may have a retained FB. He denies any pain, signs of infection. He is UTD on his tetanus.   HPI  Past Medical History:  Diagnosis Date  . CAD in native artery  11/26/2016  . Enlarged prostate   . Hyperlipidemia LDL goal <70 12/04/2016  . Hypertension     Patient Active Problem List   Diagnosis Date Noted  . Unstable angina ([place]) 12/04/2016  . Hyperlipidemia LDL goal <70 12/04/2016  . Coronary artery disease due to lipid rich plaque 11/26/2016  . S/P cardiac cath: Severe 3 vessel disease, not a candidate for CABG. Medical treatment 11/26/2016  . Non-ST elevation (NSTEMI) myocardial infarction ([place]) 11/24/2016  . Family history of coronary artery disease 11/24/2016  . PAF (paroxysmal atrial fibrillation) ([place]) 11/24/2016  . BPH (benign prostatic hyperplasia) 11/24/2016  . Essential hypertension 01/12/2011  . KNEE PAIN, LEFT, ACUTE 01/12/2011    Past Surgical History:  Procedure Laterality Date  . HERNIA REPAIR    . LEFT HEART CATH AND CORONARY ANGIOGRAPHY N/A 11/24/2016   Procedure: Left Heart Cath and Coronary Angiography;  Surgeon: Troy Sine, MD;  Location: [facility];  Service: Cardiovascular;  Laterality: N/A;       Home Medications    Prior to Admission medications   Medication Sig Start Date End Date Taking? Authorizing Provider  acetaminophen (TYLENOL) 325 MG tablet Take 2 tablets (650 mg total) by mouth every 4 (four) hours as needed for headache or mild pain. 11/26/16   Delos Haring, PA-C  amiodarone (PACERONE) 200 MG tablet Take 1 tablet (200 mg total) by mouth 2 (two) times daily. 06/14/17   [name], DO    apixaban (ELIQUIS) 5 MG TABS tablet Take 1 tablet (5 mg total) by mouth 2 (two) times daily. 01/17/17   Jettie Booze, MD  aspirin 81 MG tablet Take 81 mg by mouth daily.     [provider]  atorvastatin (LIPITOR) 80 MG tablet Take 1 tablet (80 mg total) by mouth daily at 6 PM. 11/26/16   Carlota Raspberry, Tiffany, PA-C  fexofenadine (ALLEGRA) 180 MG tablet Take 1 tablet (180 mg total) by mouth daily. 03/21/17   [name], DO  finasteride (PROSCAR) 5 MG tablet Take 5 mg by mouth daily.    [provider]  isosorbide mononitrate (IMDUR) 60 MG 24 hr tablet TAKE 1 TABLET BY MOUTH ONCE DAILY 06/30/17   Lyda Jester M, PA-C  lisinopril (PRINIVIL,ZESTRIL) 10 MG tablet Take 1 tablet (10 mg total) by mouth daily. 01/17/17   Jettie Booze, MD  Multiple Vitamin (MULTIVITAMIN) tablet Take 1 tablet by mouth daily.    [provider]  nitroGLYCERIN (NITROSTAT) 0.4 MG SL tablet Place 1 tablet (0.4 mg total) under the tongue every 5 (five) minutes x 3 doses as needed for chest pain. 11/26/16   Delos Haring, PA-C  pantoprazole (PROTONIX) 40 MG tablet TAKE 1 TABLET BY MOUTH ONCE DAILY 05/24/17   Lyda Jester M, PA-C  tamsulosin (FLOMAX) 0.4 MG CAPS capsule Take 0.4 mg by mouth daily.     [provider]  Family History Family History  Problem Relation Age of Onset  . Coronary artery disease Father   . Coronary artery disease Sister   . Coronary artery disease Brother   . Hypertension Mother     Social History Social History  Substance Use Topics  . Smoking status: Never Smoker  . Smokeless tobacco: Never Used  . Alcohol use No     Allergies   Patient has no known allergies.   Review of Systems Review of Systems  Skin: Positive for wound.  Neurological: Negative for weakness.     Physical Exam Updated Vital Signs BP 122/60 (BP Location: Right Arm)   Pulse (!) 55   Temp 97.9 F (36.6 C) (Oral)   Resp 16   Ht 5\' 7"  (1.702  m)   Wt 76.7 kg (169 lb)   SpO2 99%   BMI 26.47 kg/m   Physical Exam  Constitutional: He is oriented to person, place, and time. He appears well-developed and well-nourished. No distress.  HENT:  Head: Normocephalic and atraumatic.  Eyes: Conjunctivae are normal. No scleral icterus.  Neck: Normal range of motion. Neck supple.  Cardiovascular: Normal rate, regular rhythm and normal heart sounds.   Pulmonary/Chest: Effort normal and breath sounds normal. No respiratory distress.  Abdominal: Soft. There is no tenderness.  Musculoskeletal: He exhibits no edema.  Neurological: He is alert and oriented to person, place, and time.  Skin: Skin is warm and dry. He is not diaphoretic.  4 mm cut to the right 4th digit. No sign of infection. FROM of the the finger, normal strength.  Psychiatric: His behavior is normal.  Nursing note and vitals reviewed.    ED Treatments / Results  Labs (all labs ordered are listed, but only abnormal results are displayed) Labs Reviewed - No data to display  EKG  EKG Interpretation None       Radiology Dg Hand Complete Right  Result Date: 07/07/2017 CLINICAL DATA:  Laceration to the right ring finger on a piece of glass yesterday. Initial encounter. EXAM: RIGHT HAND - COMPLETE 3+ VIEW COMPARISON:  None. FINDINGS: No opaque foreign body in the soft tissues of the ring finger at the site of the laceration overlying the PIP joint. No evidence of acute fracture or dislocation. Mild to moderate narrowing of the PIP and DIP joint spaces of all the fingers and the IP joint space of the thumb. Mild narrowing of the first MCP joint space. Bone mineral density relatively well preserved for age. IMPRESSION: 1. No opaque foreign body in the soft tissues of the ring finger. 2. Osteoarthritis. Electronically Signed   By: Evangeline Dakin M.D.   On: 07/07/2017 09:26   [place] Rt Lower Extrem Ltd Soft Tissue Non Vascular  Result Date: 07/06/2017 CLINICAL DATA:  Initial  evaluation for palpable mass lesion at right lateral buttock for 4 days. EXAM: ULTRASOUND RIGHT LOWER EXTREMITY LIMITED TECHNIQUE: Ultrasound examination of the lower extremity soft tissues was performed in the area of clinical concern. COMPARISON:  None available. FINDINGS: Targeted ultrasound of the area concern at the right lateral bladder demonstrates an apparent heterogeneous ovoid circumscribed mass measuring 5.2 x 1.5 x 5.7 cm. Scattered areas of internal vascularity. Lesion is largely isoechoic to adjacent soft tissues, and position just deep to the subcutaneous soft tissues. IMPRESSION: 5.2 x 1.5 x 5.7 cm apparent soft tissue mass at the right lateral buttock, of uncertain etiology. Further evaluation with dedicated MRI, with and without gadolinium, is recommended. Electronically Signed   By:  Jeannine Boga M.D.   On: 07/06/2017 23:29    Procedures Procedures (including critical care time)  Medications Ordered in ED Medications - No data to display   Initial Impression / Assessment and Plan / ED Course  I have reviewed the triage vital signs and the nursing notes.  Pertinent labs & imaging results that were available during my care of the patient were reviewed by me and considered in my medical decision making (see chart for details).     Xray negative for FB treat cut as nornal. Discussed return precautions.   Final Clinical Impressions(s) / ED Diagnoses   Final diagnoses:  Cut of finger    New Prescriptions Discharge Medication List as of 07/07/2017 10:08 AM       Margarita Mail, PA-C 07/07/17 1108    Cardama, Grayce Sessions, MD 07/08/17 1622

## 2017-07-07 NOTE — ED Notes (Signed)
Bacitracin and bandaid applied to wound.

## 2017-07-07 NOTE — Progress Notes (Signed)
BMP ordered for contrast enhanced MR.

## 2017-07-07 NOTE — Discharge Instructions (Signed)
There are no foreign bodies in your finger. Treat th cut as you normally would.

## 2017-07-08 LAB — BASIC METABOLIC PANEL
BUN: 18 mg/dL (ref 6–23)
CALCIUM: 9.4 mg/dL (ref 8.4–10.5)
CHLORIDE: 103 meq/L (ref 96–112)
CO2: 30 mEq/L (ref 19–32)
CREATININE: 0.86 mg/dL (ref 0.40–1.50)
GFR: 111.48 mL/min (ref >60.00)
Glucose, Bld: 94 mg/dL (ref 70–99)
Potassium: 4.2 mEq/L (ref 3.5–5.1)
Sodium: 138 mEq/L (ref 135–145)

## 2017-07-09 ENCOUNTER — Ambulatory Visit (HOSPITAL_BASED_OUTPATIENT_CLINIC_OR_DEPARTMENT_OTHER)
Admission: RE | Admit: 2017-07-09 | Discharge: 2017-07-09 | Disposition: A | Discharge status: Home / Self Care | Payer: PPO | Source: Ambulatory Visit | Attending: Family Medicine | Admitting: Family Medicine

## 2017-07-09 DIAGNOSIS — L0231 Cutaneous abscess of buttock: Secondary | ICD-10-CM | POA: Diagnosis not present

## 2017-07-09 DIAGNOSIS — D171 Benign lipomatous neoplasm of skin and subcutaneous tissue of trunk: Secondary | ICD-10-CM | POA: Diagnosis not present

## 2017-07-09 DIAGNOSIS — M7989 Other specified soft tissue disorders: Secondary | ICD-10-CM

## 2017-07-09 DIAGNOSIS — M799 Soft tissue disorder, unspecified: Secondary | ICD-10-CM | POA: Diagnosis present

## 2017-07-09 MED ORDER — GADOBENATE DIMEGLUMINE 529 MG/ML IV SOLN
15.0000 mL | Freq: Once | INTRAVENOUS | Status: AC | PRN
  Administered 2017-07-09: 15 mL via INTRAVENOUS

## 2017-07-12 ENCOUNTER — Other Ambulatory Visit: Payer: PPO

## 2017-07-12 ENCOUNTER — Other Ambulatory Visit (INDEPENDENT_AMBULATORY_CARE_PROVIDER_SITE_OTHER): Payer: PPO

## 2017-07-12 DIAGNOSIS — Z8249 Family history of ischemic heart disease and other diseases of the circulatory system: Secondary | ICD-10-CM

## 2017-07-12 LAB — FECAL OCCULT BLOOD, IMMUNOCHEMICAL: Fecal Occult Bld: NEGATIVE

## 2017-07-13 ENCOUNTER — Telehealth: Payer: Self-pay | Admitting: Family Medicine

## 2017-07-13 MED ORDER — ATORVASTATIN CALCIUM 80 MG PO TABS: 80.0000 mg | ORAL_TABLET | Freq: Every day | ORAL | 2 refills | Status: DC

## 2017-07-13 NOTE — Telephone Encounter (Signed)
Caller name: Bessie Relation to pt: spouse Call back number: [phone] Pharmacy: [facility], [place]  Reason for call: Pt's spouse states pt in needing refill on atorvastatin (LIPITOR) 80 MG tablet, spouse mentioned that pt only has one pill left for tomorrow only. Pt is needing rx ASAP. Please advise.

## 2017-07-13 NOTE — Telephone Encounter (Signed)
Refill done.  

## 2017-07-22 DIAGNOSIS — K219 Gastro-esophageal reflux disease without esophagitis: Secondary | ICD-10-CM | POA: Diagnosis not present

## 2017-07-22 DIAGNOSIS — R05 Cough: Secondary | ICD-10-CM | POA: Diagnosis not present

## 2017-07-31 NOTE — Progress Notes (Signed)
Cardiology Office Note   Date:  08/01/2017   ID:  Ronald Robinson, DOB 12-24-1940, MRN [id number]  PCP:  [name], DO    No chief complaint on file. CAD   Wt Readings from Last 3 Encounters:  08/01/17 171 lb (77.6 kg)  07/07/17 169 lb (76.7 kg)  07/06/17 169 lb 4 oz (76.8 kg)       History of Present Illness: Ronald Robinson is a 76 y.o. male  with prior h/o HTN who was recently diagnosed with CAD. He initially presented to [facility] on 11/23/16 with a complaint of SSCP. In the ED his EKG initially showed AF with CVR. He was unaware of palpitations or tachycardia. His Troponin was 1.34-2.13. His subsequent EKGs show subtle ST elevations and TWI. Pt was transferred to [facility] for cardiology admission. Patient had a cardiac cath using the right radial approach which showed diffuse multivessel CAD involving the LAD, ramus intermediate, left circumflex, and RCA. EF normal at 55-60%. Pt not amenable to stent placement. CTS consulted and saw patient, they do not feel like CABG is the best treatment, his LAD system not graft able. The LCX and RCA systems are diffusely diseased and it is not possible to graft beyond the disease. Dr. Cyndia Bent recommended medical therapy. He was placed on ASA, Plavix, Imdur and Lisinopril.  Was readmitted on 2/24 with chest pain, but this time new Afib in RVR. Amiodarone was added and Plavix was changed to apixaban and he was discharged after two days.  In early March, he had blurry vision.  All he could see was the color brown.  Discharge diagnosis was "-Bilateral and complete loss of vision is typically a symptom of low blood pressure/pre-syncope, rather than CVA." -Started on Imdur, BB, Ranexa and amiodarone.  -CTA neck showed no evidence of severe vertebrobasilar disease -MR brain negativefor acute CVA -Orthostatic as negative -Symptoms resolved, not a lot canbe added from neurology standpoint, patient already on Eliquis and LDof  aspirin.  Stopped Ranexa.  In the past, he has some left sided pain that resolves with Tylenol. No further visual issues.    Past Medical History:  Diagnosis Date  . CAD in native artery  11/26/2016  . Enlarged prostate   . Hyperlipidemia LDL goal <70 12/04/2016  . Hypertension     Past Surgical History:  Procedure Laterality Date  . HERNIA REPAIR    . LEFT HEART CATH AND CORONARY ANGIOGRAPHY N/A 11/24/2016   Procedure: Left Heart Cath and Coronary Angiography;  Surgeon: Troy Sine, MD;  Location: [facility];  Service: Cardiovascular;  Laterality: N/A;     Current Outpatient Prescriptions  Medication Sig Dispense Refill  . acetaminophen (TYLENOL) 325 MG tablet Take 2 tablets (650 mg total) by mouth every 4 (four) hours as needed for headache or mild pain. 30 tablet 2  . amiodarone (PACERONE) 200 MG tablet Take 1 tablet (200 mg total) by mouth 2 (two) times daily. 60 tablet 5  . amLODipine (NORVASC) 10 MG tablet Take 10 mg by mouth daily.    Marland Kitchen apixaban (ELIQUIS) 5 MG TABS tablet Take 1 tablet (5 mg total) by mouth 2 (two) times daily. 180 tablet 3  . aspirin 81 MG tablet Take 81 mg by mouth daily.     Marland Kitchen atorvastatin (LIPITOR) 80 MG tablet Take 1 tablet (80 mg total) by mouth daily at 6 PM. 30 tablet 2  . fexofenadine (ALLEGRA) 180 MG tablet Take 1 tablet (180 mg total)  by mouth daily. 30 tablet 0  . finasteride (PROSCAR) 5 MG tablet Take 5 mg by mouth daily.    . isosorbide mononitrate (IMDUR) 60 MG 24 hr tablet TAKE 1 TABLET BY MOUTH ONCE DAILY 30 tablet 5  . lisinopril (PRINIVIL,ZESTRIL) 10 MG tablet Take 1 tablet (10 mg total) by mouth daily. 30 tablet 11  . Multiple Vitamin (MULTIVITAMIN) tablet Take 1 tablet by mouth daily.    . nitroGLYCERIN (NITROSTAT) 0.4 MG SL tablet Place 1 tablet (0.4 mg total) under the tongue every 5 (five) minutes x 3 doses as needed for chest pain. 12 tablet 12  . pantoprazole (PROTONIX) 40 MG tablet TAKE 1 TABLET BY MOUTH ONCE DAILY 30  tablet 5  . ranitidine (ZANTAC) 150 MG tablet Take 150 mg by mouth daily.    . tamsulosin (FLOMAX) 0.4 MG CAPS capsule Take 0.4 mg by mouth daily.     . Vitamin D, Ergocalciferol, (DRISDOL) 50000 units CAPS capsule Take 50,000 Units by mouth once a week.     No current facility-administered medications for this visit.     Allergies:   Patient has no known allergies.    Social History:  The patient  reports that he has never smoked. He has never used smokeless tobacco. He reports that he does not drink alcohol or use drugs.   Family History:  The patient's family history includes Coronary artery disease in his brother, father, and sister; Hypertension in his mother.    ROS:  Please see the history of present illness.   Otherwise, review of systems are positive for chronic cough.   All other systems are reviewed and negative.    PHYSICAL EXAM: VS:  BP (!) 118/46   Pulse 66   Ht 5\' 7"  (1.702 m)   Wt 171 lb (77.6 kg)   SpO2 98%   BMI 26.78 kg/m  , BMI Body mass index is 26.78 kg/m. GEN: Well nourished, well developed, in no acute distress  HEENT: normal  Neck: no JVD, carotid bruits, or masses Cardiac: RRR; no murmurs, rubs, or gallops,no edema  Respiratory:  clear to auscultation bilaterally, normal work of breathing GI: soft, nontender, nondistended, + BS MS: no deformity or atrophy  Skin: warm and dry, no rash Neuro:  Strength and sensation are intact Psych: euthymic mood, full affect    Recent Labs: 11/24/2016: TSH 1.911 12/04/2016: Magnesium 2.1 12/09/2016: ALT 22; Hemoglobin 15.4; Platelets 222 07/07/2017: BUN 18; Creatinine, Ser 0.86; Potassium 4.2; Sodium 138   Lipid Panel    Component Value Date/Time   CHOL 146 11/25/2016 0217   TRIG 34 11/25/2016 0217   HDL 49 11/25/2016 0217   CHOLHDL 3.0 11/25/2016 0217   VLDL 7 11/25/2016 0217   LDLCALC 90 11/25/2016 0217     Other studies Reviewed: Additional studies/ records that were reviewed today with results  demonstrating: renal function normal.   ASSESSMENT AND PLAN:  1. CAD/Old MI: Angina controlled on medical therapy.  Not a candidate for revasc based on anatomy of last cath.  Medica therapy recommended.  2. AFib: In NSR.  Decrease Amio to 200 mg daily.  If AFib recurs, increase back to 200 mg BID.  3. Hyperlipidemia: LDL 90. Continue statin.  4. Family h/o heart disease: COntinue healthy diet and regular exercise. 5. HTN/cough: Controlled BP.  : Stop lisinopril due to dry cough.  Start irbesartan 75 mg daily.  Check labs in a week.   Current medicines are reviewed at length with the patient  today.  The patient concerns regarding his medicines were addressed.  The following changes have been made:  As above  Labs/ tests ordered today include:  No orders of the defined types were placed in this encounter.   Recommend 150 minutes/week of aerobic exercise Low fat, low carb, high fiber diet recommended  Disposition:   FU in 1 year   Signed, Larae Grooms, MD  08/01/2017 3:19 PM    [facility] [place], [place], [place]  [address] Phone: [phone]; Fax: [phone]

## 2017-08-01 ENCOUNTER — Encounter: Payer: Self-pay | Admitting: Interventional Cardiology

## 2017-08-01 ENCOUNTER — Ambulatory Visit (INDEPENDENT_AMBULATORY_CARE_PROVIDER_SITE_OTHER): Payer: PPO | Admitting: Interventional Cardiology

## 2017-08-01 VITALS — BP 118/46 | HR 66 | Ht 67.0 in | Wt 171.0 lb

## 2017-08-01 DIAGNOSIS — T464X5A Adverse effect of angiotensin-converting-enzyme inhibitors, initial encounter: Secondary | ICD-10-CM | POA: Diagnosis not present

## 2017-08-01 DIAGNOSIS — R05 Cough: Secondary | ICD-10-CM

## 2017-08-01 DIAGNOSIS — I252 Old myocardial infarction: Secondary | ICD-10-CM

## 2017-08-01 DIAGNOSIS — E782 Mixed hyperlipidemia: Secondary | ICD-10-CM | POA: Diagnosis not present

## 2017-08-01 DIAGNOSIS — I4819 Other persistent atrial fibrillation: Secondary | ICD-10-CM

## 2017-08-01 DIAGNOSIS — I481 Persistent atrial fibrillation: Secondary | ICD-10-CM | POA: Diagnosis not present

## 2017-08-01 DIAGNOSIS — I10 Essential (primary) hypertension: Secondary | ICD-10-CM

## 2017-08-01 DIAGNOSIS — I25119 Atherosclerotic heart disease of native coronary artery with unspecified angina pectoris: Secondary | ICD-10-CM

## 2017-08-01 MED ORDER — IRBESARTAN 75 MG PO TABS: 75.0000 mg | ORAL_TABLET | Freq: Every day | ORAL | 11 refills | Status: DC

## 2017-08-01 MED ORDER — AMIODARONE HCL 200 MG PO TABS: 200.0000 mg | ORAL_TABLET | Freq: Every day | ORAL | 11 refills | Status: DC

## 2017-08-01 NOTE — Patient Instructions (Signed)
Medication Instructions:  Your physician has recommended you make the following change in your medication:  1. DECREASE amiodarone to 200 mg (1 tablet) ONCE daily  2. STOP Lisinopril  3. START irbesartan 75 mg (1 tablet) once daily  Labwork: Your physician recommends that you return for lab work in: 1 week for CMET, TSH   Testing/Procedures: None ordered  Follow-Up: Your physician wants you to follow-up in: 6 months with Dr. Irish Lack. You will receive a reminder letter in the mail two months in advance. If you don't receive a letter, please call our office to schedule the follow-up appointment.   Any Other Special Instructions Will Be Listed Below (If Applicable).     If you need a refill on your cardiac medications before your next appointment, please call your pharmacy.

## 2017-08-10 ENCOUNTER — Encounter: Payer: Self-pay | Admitting: Family Medicine

## 2017-08-10 ENCOUNTER — Ambulatory Visit (INDEPENDENT_AMBULATORY_CARE_PROVIDER_SITE_OTHER): Payer: PPO | Admitting: Family Medicine

## 2017-08-10 VITALS — BP 104/60 | HR 73 | Temp 98.2°F | Ht 67.0 in | Wt 169.5 lb

## 2017-08-10 DIAGNOSIS — J189 Pneumonia, unspecified organism: Secondary | ICD-10-CM | POA: Diagnosis not present

## 2017-08-10 MED ORDER — DOXYCYCLINE HYCLATE 100 MG PO TABS: 100.0000 mg | ORAL_TABLET | Freq: Two times a day (BID) | ORAL | 0 refills | Status: DC

## 2017-08-10 NOTE — Progress Notes (Signed)
Pre visit review using our clinic review tool, if applicable. No additional management support is needed unless otherwise documented below in the visit note. 

## 2017-08-10 NOTE — Patient Instructions (Signed)
OK to use your pine needle remedy.  Continue to push fluids, practice good hand hygiene, and cover your mouth if you cough.  If you start having fevers, shaking or shortness of breath, seek immediate care.  Let us know if you need anything.

## 2017-08-10 NOTE — Progress Notes (Signed)
Chief Complaint  Patient presents with  . Cough    congestion    Ronald Robinson here for URI complaints.  Duration: 3 weeks  Associated symptoms: coughing, runny nose Denies: sinus congestion, sinus pain, itchy watery eyes, ear pain, ear drainage, sore throat, wheezing, shortness of breath, myalgia and fevers/rigors Treatment to date: Robitussin Sick contacts: No  ROS:  Const: Denies fevers HEENT: As noted in HPI Lungs: No SOB, +cough  Past Medical History:  Diagnosis Date  . CAD in native artery  11/26/2016  . Enlarged prostate   . Hyperlipidemia LDL goal <70 12/04/2016  . Hypertension    Family History  Problem Relation Age of Onset  . Coronary artery disease Father   . Coronary artery disease Sister   . Coronary artery disease Brother   . Hypertension Mother     BP 104/60 (BP Location: Left Arm, Patient Position: Sitting, Cuff Size: Normal)   Pulse 73   Temp 98.2 F (36.8 C) (Oral)   Ht 5\' 7"  (1.702 m)   Wt 169 lb 8 oz (76.9 kg)   SpO2 96%   BMI 26.55 kg/m  General: Awake, alert, appears stated age HEENT: AT, [place], ears patent b/l and TM's neg, nares patent w/o discharge, pharynx pink and without exudates, MMM Neck: No masses or asymmetry Heart: RRR, no murmurs, no bruits Lungs: CTAB, no accessory muscle use Psych: Age appropriate judgment and insight, normal mood and affect  Walking pneumonia - Plan: doxycycline (VIBRA-TABS) 100 MG tablet  Orders as above. Will treat given duration. Continue to push fluids, practice good hand hygiene, cover mouth when coughing. F/u prn. If starting to experience fevers, shaking, or shortness of breath, seek immediate care. Pt voiced understanding and agreement to the plan.  [place], DO 08/10/17 3:28 PM

## 2017-08-12 ENCOUNTER — Other Ambulatory Visit: Payer: PPO | Admitting: *Deleted

## 2017-08-12 DIAGNOSIS — I4819 Other persistent atrial fibrillation: Secondary | ICD-10-CM

## 2017-08-12 DIAGNOSIS — I481 Persistent atrial fibrillation: Secondary | ICD-10-CM | POA: Diagnosis not present

## 2017-08-12 LAB — COMPREHENSIVE METABOLIC PANEL
A/G RATIO: 1.6 (ref 1.2–2.2)
ALT: 48 IU/L — ABNORMAL HIGH (ref 0–44)
AST: 22 IU/L (ref 0–40)
Albumin: 3.7 g/dL (ref 3.5–4.8)
Alkaline Phosphatase: 95 IU/L (ref 39–117)
BILIRUBIN TOTAL: 0.4 mg/dL (ref 0.0–1.2)
BUN/Creatinine Ratio: 20 (ref 10–24)
BUN: 17 mg/dL (ref 8–27)
CHLORIDE: 102 mmol/L (ref 96–106)
CO2: 25 mmol/L (ref 20–29)
Calcium: 9.2 mg/dL (ref 8.6–10.2)
Creatinine, Ser: 0.84 mg/dL (ref 0.76–1.27)
GFR calc Af Amer: 99 mL/min/{1.73_m2} (ref >59)
GFR calc non Af Amer: 86 mL/min/{1.73_m2} (ref >59)
GLUCOSE: 88 mg/dL (ref 65–99)
Globulin, Total: 2.3 g/dL (ref 1.5–4.5)
Potassium: 4.4 mmol/L (ref 3.5–5.2)
Sodium: 141 mmol/L (ref 134–144)
TOTAL PROTEIN: 6 g/dL (ref 6.0–8.5)

## 2017-08-12 LAB — TSH: TSH: 0.808 u[IU]/mL (ref 0.450–4.500)

## 2017-08-18 ENCOUNTER — Other Ambulatory Visit (INDEPENDENT_AMBULATORY_CARE_PROVIDER_SITE_OTHER): Payer: PPO

## 2017-08-18 ENCOUNTER — Other Ambulatory Visit: Payer: Self-pay | Admitting: Family Medicine

## 2017-08-18 DIAGNOSIS — R740 Nonspecific elevation of levels of transaminase and lactic acid dehydrogenase [LDH]: Principal | ICD-10-CM

## 2017-08-18 DIAGNOSIS — R7401 Elevation of levels of liver transaminase levels: Secondary | ICD-10-CM

## 2017-08-19 LAB — HEPATIC FUNCTION PANEL
ALBUMIN: 3.6 g/dL (ref 3.5–5.2)
ALK PHOS: 78 U/L (ref 39–117)
ALT: 30 U/L (ref 0–53)
AST: 25 U/L (ref 0–37)
BILIRUBIN DIRECT: 0.1 mg/dL (ref 0.0–0.3)
TOTAL PROTEIN: 6.5 g/dL (ref 6.0–8.3)
Total Bilirubin: 0.4 mg/dL (ref 0.2–1.2)

## 2017-10-14 ENCOUNTER — Encounter: Payer: Self-pay | Admitting: Family Medicine

## 2017-10-14 ENCOUNTER — Ambulatory Visit: Payer: PPO | Admitting: Family Medicine

## 2017-10-14 VITALS — BP 138/72 | HR 62 | Temp 98.2°F | Ht 67.0 in | Wt 175.0 lb

## 2017-10-14 DIAGNOSIS — M76891 Other specified enthesopathies of right lower limb, excluding foot: Secondary | ICD-10-CM

## 2017-10-14 MED ORDER — TRAMADOL HCL 50 MG PO TABS: 50.0000 mg | ORAL_TABLET | Freq: Three times a day (TID) | ORAL | 0 refills | Status: DC | PRN

## 2017-10-14 NOTE — Progress Notes (Signed)
Musculoskeletal Exam  Patient: Ronald Robinson DOB: Feb 15, 1941  DOS: 10/14/2017  SUBJECTIVE:  Chief Complaint:   Chief Complaint  Patient presents with  . Groin Pain   Lavi Sheehan is a 77 y.o.  male for evaluation and treatment of R groin pain.   Onset:  2 weeks ago. Picked up a sofa. 5-6 mo ago, initially injured it while lifting something and it went away. Location: Groin, R Character:  aching  Progression of issue:  is unchanged Associated symptoms: Feels it when he walks, no bruising, swelling Treatment: to date has been: none.   Neurovascular symptoms: no  ROS: Musculoskeletal/Extremities: +R groin pain  Past Medical History:  Diagnosis Date  . CAD in native artery  11/26/2016  . Enlarged prostate   . Hyperlipidemia LDL goal <70 12/04/2016  . Hypertension     Objective: VITAL SIGNS: BP 138/72 (BP Location: Left Arm, Patient Position: Sitting, Cuff Size: Normal)   Pulse 62   Temp 98.2 F (36.8 C) (Oral)   Ht 5\' 7"  (1.702 m)   Wt 175 lb (79.4 kg)   SpO2 98%   BMI 27.41 kg/m  Constitutional: Well formed, well developed. No acute distress. Thorax & Lungs: No accessory muscle use Musculoskeletal: R hip.   Normal active range of motion: yes.   Normal passive range of motion: yes Tenderness to palpation: Yes, over lateral hip flexor Deformity: no Tests positive: none Tests negative: Stinchfield, Ober's, log roll, FABER, FADDIR Neurologic: Normal sensory function. No focal deficits noted. DTR's equal and symmetry in LE's. No clonus. Psychiatric: Normal mood. Age appropriate judgment and insight. Alert & oriented x 3.    Assessment:  Hip flexor tendinitis, right - Plan: traMADol (ULTRAM) 50 MG tablet  Plan: MSK in etiology. Ultram for breakthru pain, Tylenol, heat, ice, stretches/exercises.  F/u prn. The patient voiced understanding and agreement to the plan.   [place], DO 10/14/17  1:41 PM

## 2017-10-14 NOTE — Patient Instructions (Signed)
Heat (pad or rice pillow in microwave) over affected area, 10-15 minutes every 2-3 hours while awake.   Ice/cold pack over area for 10-15 min every 2-3 hours while awake.  OK to take Tylenol 1000 mg (2 extra strength tabs) or 975 mg (3 regular strength tabs) every 6 hours as needed.  Hip Exercises It is normal to feel mild stretching, pulling, tightness, or discomfort as you do these exercises, but you should stop right away if you feel sudden pain or your pain gets worse.  STRETCHING AND RANGE OF MOTION EXERCISES These exercises warm up your muscles and joints and improve the movement and flexibility of your hip. These exercises also help to relieve pain, numbness, and tingling. Exercise A: Hamstrings, Supine  1. Lie on your back. 2. Loop a belt or towel over the ball of your left / rightfoot. The ball of your foot is on the walking surface, right under your toes. 3. Straighten your left / rightknee and slowly pull on the belt to raise your leg. ? Do not let your left / right knee bend while you do this. ? Keep your other leg flat on the floor. ? Raise the left / right leg until you feel a gentle stretch behind your left / right knee or thigh. 4. Hold this position for 30 seconds. 5. Slowly return your leg to the starting position. Repeat2 times. Complete this stretch 3 times per week. Exercise B: Hip Rotators  1. Lie on your back on a firm surface. 2. Hold your left / right knee with your left / right hand. Hold your ankle with your other hand. 3. Gently pull your left / right knee and rotate your lower leg toward your other shoulder. ? Pull until you feel a stretch in your buttocks. ? Keep your hips and shoulders firmly planted while you do this stretch. 4. Hold this position for 30 seconds. Repeat 2 times. Complete this stretch 3 times per week. Exercise C: V-Sit (Hamstrings and Adductors)  1. Sit on the floor with your legs extended in a large "V" shape. Keep your knees  straight during this exercise. 2. Start with your head and chest upright, then bend at your waist to reach for your left foot (position A). You should feel a stretch in your right inner thigh. 3. Hold this position for 30 seconds. Then slowly return to the upright position. 4. Bend at your waist to reach forward (position B). You should feel a stretch behind both of your thighs and knees. 5. Hold this position for 30 seconds. Then slowly return to the upright position. 6. Bend at your waist to reach for your right foot (position C). You should feel a stretch in your left inner thigh. 7. Hold this position for 30 seconds. Then slowly return to the upright position. Repeat A, B, and C 2 times each. Complete this stretch 3 times per week. Exercise D: Lunge (Hip Flexors)  1. Place your left / right knee on the floor and bend your other knee so that is directly over your ankle. You should be half-kneeling. 2. Keep good posture with your head over your shoulders. 3. Tighten your buttocks to point your tailbone downward. This helps your back to keep from arching too much. 4. You should feel a gentle stretch in the front of your left / right thigh and hip. If you do not feel any resistance, slightly slide your other foot forward and then slowly lunge forward so your knee once  again lines up over your ankle. 5. Make sure your tailbone continues to point downward. 6. Hold this position for 30 seconds. Repeat 2 times. Complete this stretch 3 times per week.  STRENGTHENING EXERCISES These exercises build strength and endurance in your hip. Endurance is the ability to use your muscles for a long time, even after they get tired. Exercise E: Bridge (Hip Extensors)  1. Lie on your back on a firm surface with your knees bent and your feet flat on the floor. 2. Tighten your buttocks muscles and lift your bottom off the floor until the trunk of your body is level with your thighs. ? Do not arch your  back. ? You should feel the muscles working in your buttocks and the back of your thighs. If you do not feel these muscles, slide your feet 1-2 inches (2.5-5 cm) farther away from your buttocks. 3. Hold this position for 3 seconds. 4. Slowly lower your hips to the starting position. Repeat for a total of 10 repetitions. 5. Let your muscles relax completely between repetitions. 6. If this exercise is too easy, try doing it with your arms crossed over your chest. Repeat 2 times. Complete this exercise 3 times per week. Exercise F: Straight Leg Raises - Hip Abductors  1. Lie on your side with your left / right leg in the top position. Lie so your head, shoulder, knee, and hip line up with each other. You may bend your bottom knee to help you balance. 2. Roll your hips slightly forward, so your hips are stacked directly over each other and your left / right knee is facing forward. 3. Leading with your heel, lift your top leg 4-6 inches (10-15 cm). You should feel the muscles in your outer hip lifting. ? Do not let your foot drift forward. ? Do not let your knee roll toward the ceiling. 4. Hold this position for 1 second. 5. Slowly return to the starting position. 6. Let your muscles relax completely between repetitions. Repeat for a total of 10 repetitions.  Repeat 2 times. Complete this exercise 3 times per week. Exercise G: Straight Leg Raises - Hip Adductors  1. Lie on your side with your left / right leg in the bottom position. Lie so your head, shoulder, knee, and hip line up. You may place your upper foot in front to help you balance. 2. Roll your hips slightly forward, so your hips are stacked directly over each other and your left / right knee is facing forward. 3. Tense the muscles in your inner thigh and lift your bottom leg 4-6 inches (10-15 cm). 4. Hold this position for 1 second. 5. Slowly return to the starting position. 6. Let your muscles relax completely between repetitions.  Repeat for a total of 10 repetitions. Repeat 2 times. Complete this exercise 3 times per week. Exercise H: Straight Leg Raises - Quadriceps  1. Lie on your back with your left / right leg extended and your other knee bent. 2. Tense the muscles in the front of your left / right thigh. When you do this, you should see your kneecap slide up or see increased dimpling just above your knee. 3. Tighten these muscles even more and raise your leg 4-6 inches (10-15 cm) off the floor. 4. Hold this position for 3 seconds. 5. Keep these muscles tense as you lower your leg. 6. Relax the muscles slowly and completely between repetitions. Repeat for a total of 10 repetitions. Repeat 2 times. Complete  this exercise 3 times per week. Exercise I: Hip Abductors, Standing 1. Tie one end of a rubber exercise band or tubing to a secure surface, such as a table or pole. 2. Loop the other end of the band or tubing around your left / right ankle. 3. Keeping your ankle with the band or tubing directly opposite of the secured end, step away until there is tension in the tubing or band. Hold onto a chair as needed for balance. 4. Lift your left / right leg out to your side. While you do this: ? Keep your back upright. ? Keep your shoulders over your hips. ? Keep your toes pointing forward. ? Make sure to use your hip muscles to lift your leg. Do not "throw" your leg or tip your body to lift your leg. 5. Hold this position for 1 second. 6. Slowly return to the starting position. Repeat for a total of 10 repetitions. Repeat 2 times. Complete this exercise 3 times per week. Exercise J: Squats (Quadriceps) 1. Stand in a door frame so your feet and knees are in line with the frame. You may place your hands on the frame for balance. 2. Slowly bend your knees and lower your hips like you are going to sit in a chair. ? Keep your lower legs in a straight-up-and-down position. ? Do not let your hips go lower than your  knees. ? Do not bend your knees lower than told by your health care provider. ? If your hip pain increases, do not bend as low. 3. Hold this position for 1 second. 4. Slowly push with your legs to return to standing. Do not use your hands to pull yourself to standing. Repeat for a total of 10 repetitions. Repeat 2 times. Complete this exercise 3 times per week. Make sure you discuss any questions you have with your health care provider. Document Released: 10/15/2005 Document Revised: 06/21/2016 Document Reviewed: 09/22/2015 Elsevier Interactive Patient Education  [employer].

## 2017-10-14 NOTE — Progress Notes (Signed)
Pre visit review using our clinic review tool, if applicable. No additional management support is needed unless otherwise documented below in the visit note. 

## 2017-10-17 ENCOUNTER — Other Ambulatory Visit: Payer: Self-pay | Admitting: Family Medicine

## 2017-11-28 ENCOUNTER — Other Ambulatory Visit: Payer: Self-pay

## 2017-11-28 MED ORDER — NITROGLYCERIN 0.4 MG SL SUBL: 0.4000 mg | SUBLINGUAL_TABLET | SUBLINGUAL | 12 refills | Status: DC | PRN

## 2017-11-28 NOTE — Telephone Encounter (Signed)
Dr. Irish Lack has never filled, is it ok to fill?

## 2017-12-15 ENCOUNTER — Other Ambulatory Visit: Payer: Self-pay | Admitting: Cardiology

## 2017-12-15 NOTE — Telephone Encounter (Signed)
Pt's medication was sent to pt's pharmacy as requested. Confirmation received.  °

## 2017-12-20 DIAGNOSIS — N138 Other obstructive and reflux uropathy: Secondary | ICD-10-CM | POA: Diagnosis not present

## 2017-12-20 DIAGNOSIS — R972 Elevated prostate specific antigen [PSA]: Secondary | ICD-10-CM | POA: Diagnosis not present

## 2017-12-20 DIAGNOSIS — N401 Enlarged prostate with lower urinary tract symptoms: Secondary | ICD-10-CM | POA: Diagnosis not present

## 2018-01-05 ENCOUNTER — Other Ambulatory Visit: Payer: Self-pay | Admitting: Cardiology

## 2018-01-17 ENCOUNTER — Telehealth: Payer: Self-pay | Admitting: *Deleted

## 2018-01-17 MED ORDER — IRBESARTAN 150 MG PO TABS: 75.0000 mg | ORAL_TABLET | Freq: Every day | ORAL | 3 refills | Status: DC

## 2018-01-17 NOTE — Telephone Encounter (Addendum)
Called and spoke to the pharmacist at [facility] in [facility]. They stated that the irbesartan 75 mg tablets are on backorder. They stated that they do have the 150 mg tablets. New order placed for irbesartan 150 mg tablet: Take 1/2 tablet once daily.

## 2018-01-17 NOTE — Telephone Encounter (Signed)
Received a fax from the [place] in [facility] stating that they do now in fact have the irbesartan 150 mg tablets and will fill the patient's Rx. Will hold off on switching to telmisartan for now.

## 2018-01-17 NOTE — Telephone Encounter (Signed)
Left message for patient to call back  

## 2018-01-17 NOTE — Telephone Encounter (Signed)
Would recommend change to telmisartan 40mg  daily for HTN. He has tried lisinopril in the past. Monitor pressures for 3-4 weeks after change if able and call with issues.

## 2018-01-17 NOTE — Telephone Encounter (Signed)
Received fax from [place], [place] requesting an alternate as the irbesartan is on backorder. Please advise. Thanks, MI

## 2018-01-17 NOTE — Telephone Encounter (Signed)
Fax received by [place] in [facility] stating that all of their irbesartan is on backorder, even though I was told by the pharmacist on the phone earlier that they had the 150 mg tablets. Will forward to PharmD for review and recommendation on medication change.

## 2018-01-17 NOTE — Telephone Encounter (Signed)
Called and spoke to patient's wife (DPR on file) and made her aware of the change for when they pick up the new prescription. Wife verbalized understanding and thanked me for the call.

## 2018-01-19 ENCOUNTER — Other Ambulatory Visit: Payer: Self-pay | Admitting: Family Medicine

## 2018-02-07 ENCOUNTER — Encounter: Payer: Self-pay | Admitting: Interventional Cardiology

## 2018-02-07 ENCOUNTER — Ambulatory Visit (INDEPENDENT_AMBULATORY_CARE_PROVIDER_SITE_OTHER): Payer: PPO | Admitting: Interventional Cardiology

## 2018-02-07 ENCOUNTER — Encounter (INDEPENDENT_AMBULATORY_CARE_PROVIDER_SITE_OTHER): Payer: Self-pay

## 2018-02-07 VITALS — BP 122/62 | HR 55 | Ht 67.0 in | Wt 180.8 lb

## 2018-02-07 DIAGNOSIS — I481 Persistent atrial fibrillation: Secondary | ICD-10-CM

## 2018-02-07 DIAGNOSIS — I252 Old myocardial infarction: Secondary | ICD-10-CM

## 2018-02-07 DIAGNOSIS — Z8249 Family history of ischemic heart disease and other diseases of the circulatory system: Secondary | ICD-10-CM

## 2018-02-07 DIAGNOSIS — I10 Essential (primary) hypertension: Secondary | ICD-10-CM

## 2018-02-07 DIAGNOSIS — E782 Mixed hyperlipidemia: Secondary | ICD-10-CM

## 2018-02-07 DIAGNOSIS — I25119 Atherosclerotic heart disease of native coronary artery with unspecified angina pectoris: Secondary | ICD-10-CM

## 2018-02-07 DIAGNOSIS — I4819 Other persistent atrial fibrillation: Secondary | ICD-10-CM

## 2018-02-07 NOTE — Patient Instructions (Signed)
Medication Instructions:  Your physician has recommended you make the following change in your medication:   STOP: Aspirin  Labwork: Your physician recommends that you return for a FASTING lipid profile, complete metabolic panel, complete blood count, and thyroid function panel   Testing/Procedures: None ordered  Follow-Up: Your physician wants you to follow-up in: 6 months with Dr. Irish Lack. You will receive a reminder letter in the mail two months in advance. If you don't receive a letter, please call our office to schedule the follow-up appointment.   Any Other Special Instructions Will Be Listed Below (If Applicable).     If you need a refill on your cardiac medications before your next appointment, please call your pharmacy.

## 2018-02-07 NOTE — Progress Notes (Signed)
Cardiology Office Note   Date:  02/07/2018   ID:  Ronald Robinson, DOB May 19, 1941, MRN [id number]  PCP:  [name], DO    No chief complaint on file.  CAD  Wt Readings from Last 3 Encounters:  02/07/18 180 lb 12.8 oz (82 kg)  10/14/17 175 lb (79.4 kg)  08/10/17 169 lb 8 oz (76.9 kg)       History of Present Illness: Ronald Robinson is a 77 y.o. male   with prior h/o HTN who was recently diagnosed with CAD. He initially presented to [facility] on 11/23/16 with a complaint of SSCP. In the ED his EKG initially showed AF with CVR. He was unaware of palpitations or tachycardia. His Troponin was 1.34-2.13. His subsequent EKGs show subtle ST elevations and TWI. Pt was transferred to [facility] for cardiology admission. Patient had a cardiac cath using the right radial approach which showed diffuse multivessel CAD involving the LAD, ramus intermediate, left circumflex, and RCA. EF normal at 55-60%. Pt not amenable to stent placement. CTS consulted and saw patient, they do not feel like CABG is the best treatment, his LAD system not graft able. The LCX and RCA systems are diffusely diseased and it is not possible to graft beyond the disease. Dr. Cyndia Bent recommended medical therapy. He was placed on ASA, Plavix, Imdur and Lisinopril.  Was readmitted on 2/24 with chest pain, but this time new Afib in RVR. Amiodarone was added and Plavix was changed to apixaban and he was discharged after two days.  In early March 2018, he had blurry vision. All he could see was the color brown. Discharge diagnosis was "-Bilateral and complete loss of vision is typically a symptom of low blood pressure/pre-syncope, rather than CVA." -Started on Imdur, BB, Ranexa and amiodarone.  -CTA neck showed no evidence of severe vertebrobasilar disease -MR brain negativefor acute CVA -Orthostatic as negative -Symptoms resolved, not a lot canbe added from neurology standpoint, patient already on Eliquis  and LDof aspirin.  Stopped Ranexa. In the past, he has some left sided pain that resolves with Tylenol.   Since the visit, angina has been controlled.  He continues to work 40 hrs/week.   Denies : Chest pain. Dizziness. Leg edema. Nitroglycerin use. Orthopnea. Palpitations. Paroxysmal nocturnal dyspnea. Shortness of breath. Syncope.    Past Medical History:  Diagnosis Date  . CAD in native artery  11/26/2016  . Enlarged prostate   . Hyperlipidemia LDL goal <70 12/04/2016  . Hypertension     Past Surgical History:  Procedure Laterality Date  . HERNIA REPAIR    . LEFT HEART CATH AND CORONARY ANGIOGRAPHY N/A 11/24/2016   Procedure: Left Heart Cath and Coronary Angiography;  Surgeon: Troy Sine, MD;  Location: [facility];  Service: Cardiovascular;  Laterality: N/A;     Current Outpatient Medications  Medication Sig Dispense Refill  . acetaminophen (TYLENOL) 325 MG tablet Take 2 tablets (650 mg total) by mouth every 4 (four) hours as needed for headache or mild pain. 30 tablet 2  . amiodarone (PACERONE) 200 MG tablet Take 1 tablet (200 mg total) by mouth daily. 30 tablet 11  . amLODipine (NORVASC) 10 MG tablet Take 10 mg by mouth daily.    Marland Kitchen apixaban (ELIQUIS) 5 MG TABS tablet Take 1 tablet (5 mg total) by mouth 2 (two) times daily. 180 tablet 3  . aspirin 81 MG tablet Take 81 mg by mouth daily.     Marland Kitchen atorvastatin (LIPITOR) 80  MG tablet TAKE 1 TABLET BY MOUTH ONCE DAILY AT  6PM 90 tablet 0  . fexofenadine (ALLEGRA) 180 MG tablet Take 1 tablet (180 mg total) by mouth daily. 30 tablet 0  . finasteride (PROSCAR) 5 MG tablet Take 5 mg by mouth daily.    . irbesartan (AVAPRO) 150 MG tablet Take 0.5 tablets (75 mg total) by mouth daily. 45 tablet 3  . isosorbide mononitrate (IMDUR) 60 MG 24 hr tablet TAKE 1 TABLET BY MOUTH ONCE DAILY 90 tablet 1  . Multiple Vitamin (MULTIVITAMIN) tablet Take 1 tablet by mouth daily.    . nitroGLYCERIN (NITROSTAT) 0.4 MG SL tablet Place 1 tablet  (0.4 mg total) under the tongue every 5 (five) minutes x 3 doses as needed for chest pain. 12 tablet 12  . pantoprazole (PROTONIX) 40 MG tablet TAKE 1 TABLET BY MOUTH ONCE DAILY 90 tablet 2  . tamsulosin (FLOMAX) 0.4 MG CAPS capsule Take 0.4 mg by mouth daily.     . Vitamin D, Ergocalciferol, (DRISDOL) 50000 units CAPS capsule Take 50,000 Units by mouth once a week.    . ranitidine (ZANTAC) 150 MG tablet Take 150 mg by mouth daily.     No current facility-administered medications for this visit.     Allergies:   Patient has no known allergies.    Social History:  The patient  reports that he has never smoked. He has never used smokeless tobacco. He reports that he does not drink alcohol or use drugs.   Family History:  The patient's family history includes Coronary artery disease in his brother, father, and sister; Hypertension in his mother.    ROS:  Please see the history of present illness.   Otherwise, review of systems are positive for difficulty decreasing portion sizes at dinner.   All other systems are reviewed and negative.    PHYSICAL EXAM: VS:  BP 122/62   Pulse (!) 55   Ht 5\' 7"  (1.702 m)   Wt 180 lb 12.8 oz (82 kg)   SpO2 95%   BMI 28.32 kg/m  , BMI Body mass index is 28.32 kg/m. GEN: Well nourished, well developed, in no acute distress  HEENT: normal  Neck: no JVD, carotid bruits, or masses Cardiac: RRR; no murmurs, rubs, or gallops,no edema  Respiratory:  clear to auscultation bilaterally, normal work of breathing GI: soft, nontender, nondistended, + BS MS: no deformity or atrophy  Skin: warm and dry, no rash Neuro:  Strength and sensation are intact Psych: euthymic mood, full affect   EKG:   The ekg ordered today demonstrates NSR, biphasic T waves in the anterior leads   Recent Labs: 08/12/2017: BUN 17; Creatinine, Ser 0.84; Potassium 4.4; Sodium 141; TSH 0.808 08/18/2017: ALT 30   Lipid Panel    Component Value Date/Time   CHOL 146 11/25/2016 0217    TRIG 34 11/25/2016 0217   HDL 49 11/25/2016 0217   CHOLHDL 3.0 11/25/2016 0217   VLDL 7 11/25/2016 0217   LDLCALC 90 11/25/2016 0217     Other studies Reviewed: Additional studies/ records that were reviewed today with results demonstrating: prior hospital records reviewed.   ASSESSMENT AND PLAN:  1. CAD/Old MI: Stop aspirin. Continue Eliquis. Angina controlled on medical therapy. 2. AFib: Continue Amio for NSR.  Check TSH , LFTs.   3. Hyperlipidemia: COntinue lipid lowering therapy.  Check lipids hen fasting.  4. Family h/o heart disease: Healthy diet and increase exercise.  5. HTN/cough: ACE-I was stopped.  Switched to  ARB in 2018.   Current medicines are reviewed at length with the patient today.  The patient concerns regarding his medicines were addressed.  The following changes have been made:  Stop aspirin  Labs/ tests ordered today include:  No orders of the defined types were placed in this encounter.   Recommend 150 minutes/week of aerobic exercise Low fat, low carb, high fiber diet recommended  Disposition:   FU in 6 months   Signed, Larae Grooms, MD  02/07/2018 4:05 PM    [facility] [address], [place], [place]  [address] Phone: [phone]; Fax: [phone]

## 2018-02-09 IMAGING — CT CT ANGIO NECK
2 of 7 series · 8 of 33 positions shown · IV contrast (isovue)
Comparison: None.

CLINICAL DATA: Vertebrobasilar artery syndrome.  Visual loss.

EXAM:
CT ANGIOGRAPHY NECK
TECHNIQUE: Multidetector CT imaging of the neck was performed using the
standard protocol during bolus administration of intravenous
contrast. Multiplanar CT image reconstructions and MIPs were
obtained to evaluate the vascular anatomy. Carotid stenosis
measurements (when applicable) are obtained utilizing NASCET
criteria, using the distal internal carotid diameter as the
denominator.
CONTRAST:  50 mL Isovue-YLL

[Series 5: cta neck · axial · 0.31mm/px · z∈[-280,-196]mm · 2 of 126 slices shown]
[im 42/126  soft-tissue]
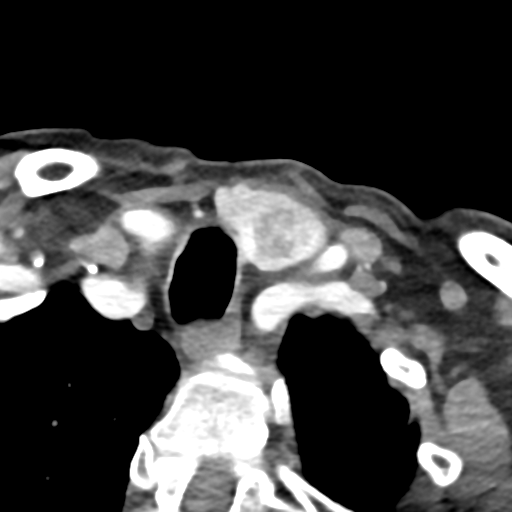
[im 84/126  soft-tissue]
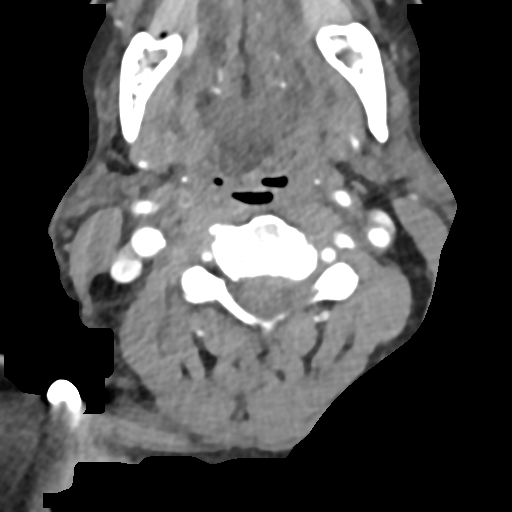

[Series 7: cta neck axial · axial · 0.39mm/px · z∈[-326,-148]mm · 6 of 249 slices shown]
[im 36/249  soft-tissue]
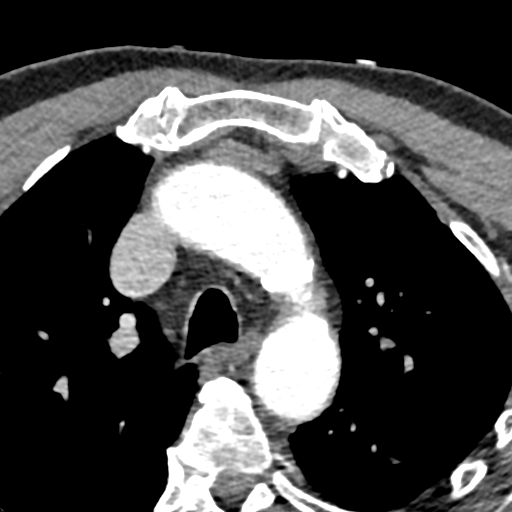
[im 71/249  bone]
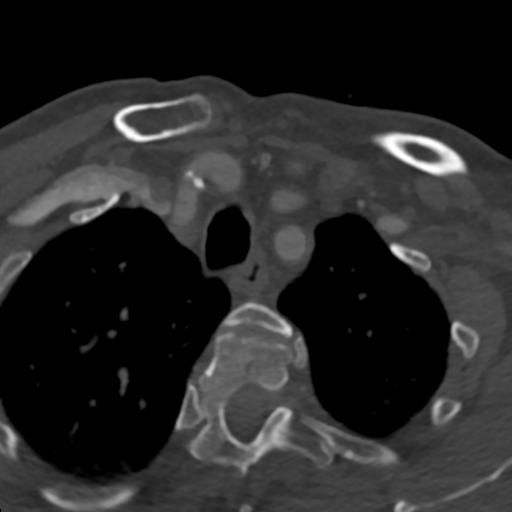
[im 107/249  soft-tissue]
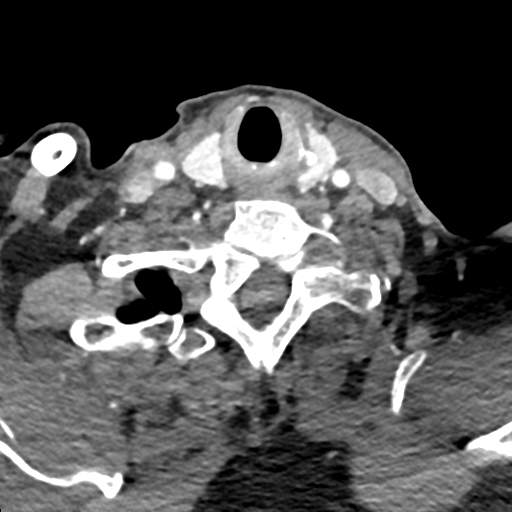
[im 142/249  bone]
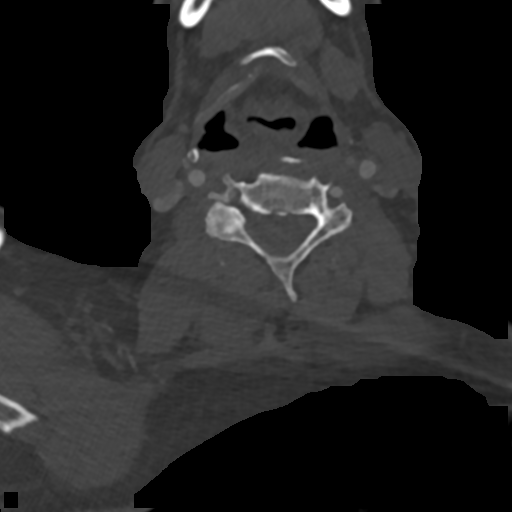
[im 178/249  soft-tissue]
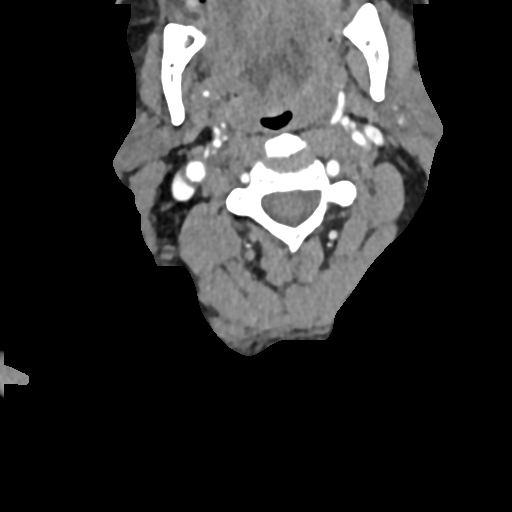
[im 213/249  bone]
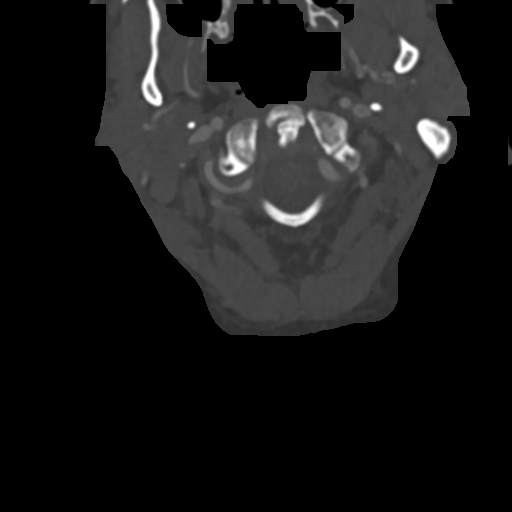

[8 of 33 positions shown; findings below may reference images not displayed]

FINDINGS: Aortic arch: Standard 3 vessel aortic arch with mild atherosclerotic
plaque. Brachiocephalic and subclavian arteries are widely patent.

Right carotid system: Patent without evidence of dissection or
significant stenosis. Mild calcified plaque in the proximal ICA.

Left carotid system: Patent without evidence of dissection or
significant stenosis. Moderate calcified and mild soft plaque about
the carotid bifurcation.

Vertebral arteries: The vertebral arteries are patent with the left
being moderately dominant. There is focal calcified plaque at the
right vertebral artery origin with likely moderate stenosis.
Calcified plaque near the left vertebral artery origin and calcified
and soft plaque in the proximal left V1 segment do not result in
significant stenosis. There are additional areas of mild-to-moderate
atherosclerotic luminal irregularity in the left V2 segment without
significant stenosis.

Skeleton: Moderate multilevel cervical disc degeneration.

Other neck: Enlarged, heterogeneous left thyroid lobe with an
underlying 2.2 cm nodule.

Upper chest: Visualized lung apices are clear.
IMPRESSION: 1. Moderate stenosis of the non dominant right vertebral artery at
its origin.
2. Mild to moderate left vertebral artery atherosclerosis without
significant stenosis.
3. Left greater than right cervical carotid artery atherosclerosis
without stenosis.
4. 2.2 cm left thyroid nodule. Non-urgent thyroid ultrasound is
recommended for further evaluation.

## 2018-02-10 ENCOUNTER — Other Ambulatory Visit: Payer: PPO | Admitting: *Deleted

## 2018-02-10 DIAGNOSIS — I25119 Atherosclerotic heart disease of native coronary artery with unspecified angina pectoris: Secondary | ICD-10-CM

## 2018-02-10 DIAGNOSIS — I4819 Other persistent atrial fibrillation: Secondary | ICD-10-CM

## 2018-02-10 DIAGNOSIS — I252 Old myocardial infarction: Secondary | ICD-10-CM

## 2018-02-10 DIAGNOSIS — I10 Essential (primary) hypertension: Secondary | ICD-10-CM | POA: Diagnosis not present

## 2018-02-10 DIAGNOSIS — E782 Mixed hyperlipidemia: Secondary | ICD-10-CM | POA: Diagnosis not present

## 2018-02-10 DIAGNOSIS — Z8249 Family history of ischemic heart disease and other diseases of the circulatory system: Secondary | ICD-10-CM

## 2018-02-10 DIAGNOSIS — I481 Persistent atrial fibrillation: Secondary | ICD-10-CM | POA: Diagnosis not present

## 2018-02-10 LAB — COMPREHENSIVE METABOLIC PANEL
ALT: 29 IU/L (ref 0–44)
AST: 24 IU/L (ref 0–40)
Albumin/Globulin Ratio: 1.7 (ref 1.2–2.2)
Albumin: 4 g/dL (ref 3.5–4.8)
Alkaline Phosphatase: 83 IU/L (ref 39–117)
BILIRUBIN TOTAL: 0.4 mg/dL (ref 0.0–1.2)
BUN/Creatinine Ratio: 30 — ABNORMAL HIGH (ref 10–24)
BUN: 26 mg/dL (ref 8–27)
CALCIUM: 9.4 mg/dL (ref 8.6–10.2)
CHLORIDE: 104 mmol/L (ref 96–106)
CO2: 24 mmol/L (ref 20–29)
CREATININE: 0.86 mg/dL (ref 0.76–1.27)
GFR calc Af Amer: 98 mL/min/{1.73_m2} (ref >59)
GFR calc non Af Amer: 85 mL/min/{1.73_m2} (ref >59)
GLUCOSE: 80 mg/dL (ref 65–99)
Globulin, Total: 2.3 g/dL (ref 1.5–4.5)
Potassium: 4.2 mmol/L (ref 3.5–5.2)
Sodium: 143 mmol/L (ref 134–144)
Total Protein: 6.3 g/dL (ref 6.0–8.5)

## 2018-02-10 LAB — CBC
HEMATOCRIT: 40.6 % (ref 37.5–51.0)
Hemoglobin: 13.1 g/dL (ref 13.0–17.7)
MCH: 26.7 pg (ref 26.6–33.0)
MCHC: 32.3 g/dL (ref 31.5–35.7)
MCV: 83 fL (ref 79–97)
PLATELETS: 197 10*3/uL (ref 150–379)
RBC: 4.91 x10E6/uL (ref 4.14–5.80)
RDW: 16.5 % — AB (ref 12.3–15.4)
WBC: 3.7 10*3/uL (ref 3.4–10.8)

## 2018-02-10 LAB — LIPID PANEL
Chol/HDL Ratio: 1.9 ratio (ref 0.0–5.0)
Cholesterol, Total: 134 mg/dL (ref 100–199)
HDL: 69 mg/dL (ref >39)
LDL CALC: 61 mg/dL (ref 0–99)
Triglycerides: 22 mg/dL (ref 0–149)
VLDL CHOLESTEROL CAL: 4 mg/dL — AB (ref 5–40)

## 2018-02-10 LAB — TSH: TSH: 0.737 u[IU]/mL (ref 0.450–4.500)

## 2018-03-08 ENCOUNTER — Other Ambulatory Visit: Payer: Self-pay | Admitting: Interventional Cardiology

## 2018-03-08 NOTE — Telephone Encounter (Signed)
Age 77 years Wt 52 kg 02/07/2018 Saw Dr Irish Lack on 02/07/2018  02/10/2018 Hgb 13.1 HCT 40.6  SrCr 0.86 Refill done for Eliquis 5mg  q 12 hours as requested

## 2018-05-16 ENCOUNTER — Other Ambulatory Visit: Payer: Self-pay | Admitting: Family Medicine

## 2018-06-21 DIAGNOSIS — N401 Enlarged prostate with lower urinary tract symptoms: Secondary | ICD-10-CM | POA: Diagnosis not present

## 2018-06-21 DIAGNOSIS — R972 Elevated prostate specific antigen [PSA]: Secondary | ICD-10-CM | POA: Diagnosis not present

## 2018-06-21 DIAGNOSIS — N138 Other obstructive and reflux uropathy: Secondary | ICD-10-CM | POA: Diagnosis not present

## 2018-07-09 ENCOUNTER — Other Ambulatory Visit: Payer: Self-pay | Admitting: Cardiology

## 2018-07-10 ENCOUNTER — Other Ambulatory Visit: Payer: Self-pay | Admitting: Interventional Cardiology

## 2018-07-10 MED ORDER — ISOSORBIDE MONONITRATE ER 60 MG PO TB24: 60.0000 mg | ORAL_TABLET | Freq: Every day | ORAL | 1 refills | Status: DC

## 2018-07-10 MED ORDER — PANTOPRAZOLE SODIUM 40 MG PO TBEC: 40.0000 mg | DELAYED_RELEASE_TABLET | Freq: Every day | ORAL | 1 refills | Status: DC

## 2018-07-10 NOTE — Telephone Encounter (Signed)
Pt's medications were sent to pt's pharmacy as requested. Confirmation received.  

## 2018-07-31 ENCOUNTER — Telehealth: Payer: Self-pay | Admitting: Interventional Cardiology

## 2018-07-31 ENCOUNTER — Other Ambulatory Visit: Payer: Self-pay | Admitting: *Deleted

## 2018-07-31 MED ORDER — AMIODARONE HCL 200 MG PO TABS: 200.0000 mg | ORAL_TABLET | Freq: Every day | ORAL | 1 refills | Status: DC

## 2018-07-31 NOTE — Telephone Encounter (Signed)
° ° °*  STAT* If patient is at the pharmacy, call can be transferred to refill team.   1. Which medications need to be refilled? (please list name of each medication and dose if known) amiodarone (PACERONE) 200 MG tablet  2. Which pharmacy/location (including street and city if local pharmacy) is medication to be sent to? [place] [address]  3. Do they need a 30 day or 90 day supply? [place]

## 2018-08-20 ENCOUNTER — Other Ambulatory Visit: Payer: Self-pay | Admitting: Family Medicine

## 2018-09-20 NOTE — Progress Notes (Signed)
Cardiology Office Note   Date:  09/21/2018   ID:  Ronald Robinson, DOB 1941/03/22, MRN [id number]  PCP:  [name], DO    No chief complaint on file.  CAD  Wt Readings from Last 3 Encounters:  09/21/18 178 lb 3.2 oz (80.8 kg)  02/07/18 180 lb 12.8 oz (82 kg)  10/14/17 175 lb (79.4 kg)       History of Present Illness: Ronald Robinson is a 77 y.o. male  with prior h/o HTN who was recently diagnosed with CAD. He initially presented to [facility] on 11/23/16 with a complaint of SSCP. In the ED his EKG initially showed AF with CVR. He was unaware of palpitations or tachycardia. His Troponin was 1.34-2.13. His subsequent EKGs show subtle ST elevations and TWI. Pt was transferred to [facility] for cardiology admission. Patient had a cardiac cath using the right radial approach which showed diffuse multivessel CAD involving the LAD, ramus intermediate, left circumflex, and RCA. EF normal at 55-60%. Pt not amenable to stent placement. CTS consulted and saw patient, they do not feel like CABG is the best treatment, his LAD system not graft able. The LCX and RCA systems are diffusely diseased and it is not possible to graft beyond the disease. Dr. Cyndia Bent recommended medical therapy. He was placed on ASA, Plavix, Imdur and Lisinopril.  Was readmitted on 2/24 with chest pain, but this time new Afib in RVR. Amiodarone was added and Plavix was changed to apixaban and he was discharged after two days.  In early March 2018, he had blurry vision. All he could see was the color brown. Discharge diagnosis was "-Bilateral and complete loss of vision is typically a symptom of low blood pressure/pre-syncope, rather than CVA." -Started on Imdur, BB, Ranexa and amiodarone.  -CTA neck showed no evidence of severe vertebrobasilar disease -MR brain negativefor acute CVA -Orthostatic as negative -Symptoms resolved, not a lot canbe added from neurology standpoint, patient already on Eliquis  and LDof aspirin.  Stopped Ranexa.In the past, he has some left sided pain that resolves with Tylenol.   Denies :  Dizziness. Leg edema. Nitroglycerin use. Orthopnea. Palpitations. Paroxysmal nocturnal dyspnea. Shortness of breath. Syncope.   Rare Exertional chest tightness if he walks fast.  Not with regular activity or his job.      Past Medical History:  Diagnosis Date  . CAD in native artery  11/26/2016  . Enlarged prostate   . Hyperlipidemia LDL goal <70 12/04/2016  . Hypertension     Past Surgical History:  Procedure Laterality Date  . HERNIA REPAIR    . LEFT HEART CATH AND CORONARY ANGIOGRAPHY N/A 11/24/2016   Procedure: Left Heart Cath and Coronary Angiography;  Surgeon: Troy Sine, MD;  Location: [facility];  Service: Cardiovascular;  Laterality: N/A;     Current Outpatient Medications  Medication Sig Dispense Refill  . acetaminophen (TYLENOL) 325 MG tablet Take 2 tablets (650 mg total) by mouth every 4 (four) hours as needed for headache or mild pain. 30 tablet 2  . amiodarone (PACERONE) 200 MG tablet Take 1 tablet (200 mg total) by mouth daily. 90 tablet 1  . amLODipine (NORVASC) 10 MG tablet Take 10 mg by mouth daily.    Marland Kitchen atorvastatin (LIPITOR) 80 MG tablet TAKE 1 TABLET BY MOUTH ONCE DAILY AT  6PM 90 tablet 0  . ELIQUIS 5 MG TABS tablet TAKE 1 TABLET BY MOUTH TWICE DAILY 180 tablet 1  . fexofenadine (ALLEGRA) 180  MG tablet Take 1 tablet (180 mg total) by mouth daily. 30 tablet 0  . finasteride (PROSCAR) 5 MG tablet Take 5 mg by mouth daily.    . irbesartan (AVAPRO) 150 MG tablet Take 0.5 tablets (75 mg total) by mouth daily. 45 tablet 3  . isosorbide mononitrate (IMDUR) 60 MG 24 hr tablet TAKE 1 TABLET BY MOUTH ONCE DAILY 90 tablet 1  . Multiple Vitamin (MULTIVITAMIN) tablet Take 1 tablet by mouth daily.    . nitroGLYCERIN (NITROSTAT) 0.4 MG SL tablet Place 1 tablet (0.4 mg total) under the tongue every 5 (five) minutes x 3 doses as needed for chest  pain. 12 tablet 12  . pantoprazole (PROTONIX) 40 MG tablet Take 1 tablet (40 mg total) by mouth daily. 90 tablet 1  . tamsulosin (FLOMAX) 0.4 MG CAPS capsule Take 0.4 mg by mouth daily.     . ranitidine (ZANTAC) 150 MG tablet Take 150 mg by mouth daily.     No current facility-administered medications for this visit.     Allergies:   Patient has no known allergies.    Social History:  The patient  reports that he has never smoked. He has never used smokeless tobacco. He reports that he does not drink alcohol or use drugs.   Family History:  The patient's family history includes Coronary artery disease in his brother, father, and sister; Hypertension in his mother.    ROS:  Please see the history of present illness.   Otherwise, review of systems are positive for occasional exertional chest pain.   All other systems are reviewed and negative.    PHYSICAL EXAM: VS:  BP 104/60   Pulse 69   Ht 5\' 7"  (1.702 m)   Wt 178 lb 3.2 oz (80.8 kg)   SpO2 96%   BMI 27.91 kg/m  , BMI Body mass index is 27.91 kg/m. GEN: Well nourished, well developed, in no acute distress  HEENT: normal  Neck: no JVD, carotid bruits, or masses Cardiac: RRR; no murmurs, rubs, or gallops,no edema  Respiratory:  clear to auscultation bilaterally, normal work of breathing GI: soft, nontender, nondistended, + BS MS: no deformity or atrophy  Skin: warm and dry, no rash Neuro:  Strength and sensation are intact Psych: euthymic mood, full affect     Recent Labs: 02/10/2018: ALT 29; BUN 26; Creatinine, Ser 0.86; Hemoglobin 13.1; Platelets 197; Potassium 4.2; Sodium 143; TSH 0.737   Lipid Panel    Component Value Date/Time   CHOL 134 02/10/2018 0722   TRIG 22 02/10/2018 0722   HDL 69 02/10/2018 0722   CHOLHDL 1.9 02/10/2018 0722   CHOLHDL 3.0 11/25/2016 0217   VLDL 7 11/25/2016 0217   LDLCALC 61 02/10/2018 0722     Other studies Reviewed: Additional studies/ records that were reviewed today with results  demonstrating: cath films personally reviewed and shown to the patient.   ASSESSMENT AND PLAN:  1. CAD/Old MI: Mild anginal symptoms.  He thinks they are infrequent.  He will keep better track.  If they are more frequent, he will let us know.  He is satisfied with his activity level at this time.  I do think his RCA could be revascularized.  His LAD is so diffusely diseased I do not think it could be revascularized.  Could consider try to revascularize the circumflex, but I think the RCA would be be best target. 2. AFib: In NSR.  Continue amiodarone. 3. Hyperlipidemia: LDL 61.  Continue lipid-lowering therapy. 4. Family  h/o heart disease: Continue healthy lifestyle. 5. HTN/cough: resolved after switch to ARB in 2018.   Current medicines are reviewed at length with the patient today.  The patient concerns regarding his medicines were addressed.  The following changes have been made:  No change  Labs/ tests ordered today include:  No orders of the defined types were placed in this encounter.   Recommend 150 minutes/week of aerobic exercise Low fat, low carb, high fiber diet recommended  Disposition:   FU in 3 months   Signed, Larae Grooms, MD  09/21/2018 [facility] [place], [place], [place]  [address] Phone: [phone]; Fax: [phone]

## 2018-09-21 ENCOUNTER — Encounter: Payer: Self-pay | Admitting: Interventional Cardiology

## 2018-09-21 ENCOUNTER — Ambulatory Visit (INDEPENDENT_AMBULATORY_CARE_PROVIDER_SITE_OTHER): Payer: PPO | Admitting: Interventional Cardiology

## 2018-09-21 VITALS — BP 104/60 | HR 69 | Ht 67.0 in | Wt 178.2 lb

## 2018-09-21 DIAGNOSIS — I4819 Other persistent atrial fibrillation: Secondary | ICD-10-CM

## 2018-09-21 DIAGNOSIS — I25119 Atherosclerotic heart disease of native coronary artery with unspecified angina pectoris: Secondary | ICD-10-CM

## 2018-09-21 DIAGNOSIS — I252 Old myocardial infarction: Secondary | ICD-10-CM | POA: Diagnosis not present

## 2018-09-21 DIAGNOSIS — Z8249 Family history of ischemic heart disease and other diseases of the circulatory system: Secondary | ICD-10-CM | POA: Diagnosis not present

## 2018-09-21 NOTE — Patient Instructions (Signed)
Medication Instructions:  Your physician recommends that you continue on your current medications as directed. Please refer to the Current Medication list given to you today.  If you need a refill on your cardiac medications before your next appointment, please call your pharmacy.   Lab work: None Ordered  If you have labs (blood work) drawn today and your tests are completely normal, you will receive your results only by: Marland Kitchen MyChart Message (if you have MyChart) OR . A paper copy in the mail If you have any lab test that is abnormal or we need to change your treatment, we will call you to review the results.  Testing/Procedures: None ordered  Follow-Up: . Your physician recommends that you schedule a follow-up appointment in: 3 months with Dr. Irish Lack  Any Other Special Instructions Will Be Listed Below (If Applicable).

## 2018-09-22 DIAGNOSIS — R531 Weakness: Secondary | ICD-10-CM | POA: Diagnosis not present

## 2018-09-22 DIAGNOSIS — W19XXXA Unspecified fall, initial encounter: Secondary | ICD-10-CM | POA: Diagnosis not present

## 2018-09-22 DIAGNOSIS — R55 Syncope and collapse: Secondary | ICD-10-CM | POA: Diagnosis not present

## 2018-09-22 DIAGNOSIS — R42 Dizziness and giddiness: Secondary | ICD-10-CM | POA: Diagnosis not present

## 2018-09-22 DIAGNOSIS — I959 Hypotension, unspecified: Secondary | ICD-10-CM | POA: Diagnosis not present

## 2018-09-22 DIAGNOSIS — R001 Bradycardia, unspecified: Secondary | ICD-10-CM | POA: Diagnosis not present

## 2018-09-22 DIAGNOSIS — R9431 Abnormal electrocardiogram [ECG] [EKG]: Secondary | ICD-10-CM | POA: Diagnosis not present

## 2018-09-22 DIAGNOSIS — R4020 Unspecified coma: Secondary | ICD-10-CM | POA: Diagnosis not present

## 2018-10-14 ENCOUNTER — Other Ambulatory Visit: Payer: Self-pay | Admitting: Interventional Cardiology

## 2018-10-16 NOTE — Telephone Encounter (Signed)
Eliquis 5mg  refill request received; pt is 78 yrs old, wt-80.8kg, Crea-0.86 on 02/10/18, last seen by Dr. Irish Lack on 09/21/18; will send in refill to requested pharmacy.

## 2018-11-20 ENCOUNTER — Telehealth: Payer: Self-pay | Admitting: Interventional Cardiology

## 2018-11-20 NOTE — Telephone Encounter (Signed)
New Message   Pt c/o of Chest Pain: STAT if CP now or developed within 24 hours  1. Are you having CP right now? No   2. Are you experiencing any other symptoms (ex. SOB, nausea, vomiting, sweating)? When he walks up the street he does have SOB and chest burning but it goes away after 15 minutes.   3. How long have you been experiencing CP? Since September   4. Is your CP continuous or coming and going? Coming and going   5. Have you taken Nitroglycerin? No Patient hasn't taken one in a will they stated.    Patient states they called the ambulance about three weeks ago and the paramedic stated it was from stress.  ?

## 2018-11-20 NOTE — Telephone Encounter (Addendum)
I spoke to the patient who has been experiencing CP and SOB since September.  He relates this a lot to his job and anxiety.  He lists his CP at 8 on a 1-10 scale, which sounds pretty substantial.    The patient has NTG, but has not taken any.  I scheduled an appt with [facility] 2/11 @ 9:00, but told him that if pain gets severe, please take a NTG or go to the ED for further evaluation.

## 2018-11-20 NOTE — Telephone Encounter (Signed)
He has pretty severe CAD without great targets for PCI.  I think perhaps his RCA was a target but would try to up his meds.  If unsuccessful, would schedule a cath with me.  WOuld not bother with stress test.

## 2018-11-21 ENCOUNTER — Ambulatory Visit: Payer: Self-pay | Admitting: Physician Assistant

## 2018-11-25 ENCOUNTER — Other Ambulatory Visit: Payer: Self-pay | Admitting: Family Medicine

## 2018-12-07 ENCOUNTER — Ambulatory Visit (INDEPENDENT_AMBULATORY_CARE_PROVIDER_SITE_OTHER): Payer: PPO | Admitting: Family Medicine

## 2018-12-07 ENCOUNTER — Encounter: Payer: Self-pay | Admitting: Family Medicine

## 2018-12-07 VITALS — BP 140/72 | HR 72 | Temp 98.0°F | Ht 71.0 in | Wt 189.4 lb

## 2018-12-07 DIAGNOSIS — F418 Other specified anxiety disorders: Secondary | ICD-10-CM | POA: Diagnosis not present

## 2018-12-07 MED ORDER — AMITRIPTYLINE HCL 10 MG PO TABS: 10.0000 mg | ORAL_TABLET | Freq: Every day | ORAL | 3 refills | Status: AC

## 2018-12-07 NOTE — Progress Notes (Signed)
Chief Complaint  Patient presents with  . Insomnia    Subjective: Patient is a 78 y.o. male here for trouble sleeping. Here w wife.   3 mo ago he was let go at work. He believes it was related to age discrimination, but it is causing him quite a bit of stress. He is having racing thoughts, anxiety, and trouble sleeping. No SI or HI. No self medication. Is not following w a counselor or psychologist.    ROS: Psych: As noted in HPI  Past Medical History:  Diagnosis Date  . CAD in native artery  11/26/2016  . Enlarged prostate   . Hyperlipidemia LDL goal <70 12/04/2016  . Hypertension     Objective: BP 140/72 (BP Location: Left Arm, Patient Position: Sitting, Cuff Size: Normal)   Pulse 72   Temp 98 F (36.7 C) (Oral)   Ht 5\' 11"  (1.803 m)   Wt 189 lb 6 oz (85.9 kg)   SpO2 98%   BMI 26.41 kg/m  General: Awake, appears stated age Lungs: No accessory muscle use Psych: Age appropriate judgment and insight, normal affect and mood  Assessment and Plan: Situational anxiety - Plan: amitriptyline (ELAVIL) 10 MG tablet  TCA as he is on amiodarone, SSRIs not ideal. LB BH # given. Stay active. F/u in 1 mo.  The patient voiced understanding and agreement to the plan.  [place], DO 12/07/18  2:56 PM

## 2018-12-07 NOTE — Patient Instructions (Addendum)
Please consider counseling. Contact [phone] to schedule an appointment or inquire about cost/insurance coverage.   Keep the diet clean and stay active.  Coping skills Choose 5 that work for you:  Take a deep breath  Count to 20  Read a book  Do a puzzle  Meditate  Bake  Sing  Knit  Garden  Pray  Go outside  Call a friend  Listen to music  Take a walk  Color  Send a note  Take a bath  Watch a movie  Be alone in a quiet place  Pet an animal  Visit a friend  Journal  Exercise  Stretch   Let us know if you need anything.

## 2018-12-20 DIAGNOSIS — M9905 Segmental and somatic dysfunction of pelvic region: Secondary | ICD-10-CM | POA: Diagnosis not present

## 2018-12-20 DIAGNOSIS — M9903 Segmental and somatic dysfunction of lumbar region: Secondary | ICD-10-CM | POA: Diagnosis not present

## 2018-12-20 DIAGNOSIS — G544 Lumbosacral root disorders, not elsewhere classified: Secondary | ICD-10-CM | POA: Diagnosis not present

## 2018-12-20 DIAGNOSIS — M9904 Segmental and somatic dysfunction of sacral region: Secondary | ICD-10-CM | POA: Diagnosis not present

## 2018-12-25 DIAGNOSIS — N138 Other obstructive and reflux uropathy: Secondary | ICD-10-CM | POA: Diagnosis not present

## 2018-12-25 DIAGNOSIS — M9905 Segmental and somatic dysfunction of pelvic region: Secondary | ICD-10-CM | POA: Diagnosis not present

## 2018-12-25 DIAGNOSIS — N401 Enlarged prostate with lower urinary tract symptoms: Secondary | ICD-10-CM | POA: Diagnosis not present

## 2018-12-25 DIAGNOSIS — M9903 Segmental and somatic dysfunction of lumbar region: Secondary | ICD-10-CM | POA: Diagnosis not present

## 2018-12-25 DIAGNOSIS — M9904 Segmental and somatic dysfunction of sacral region: Secondary | ICD-10-CM | POA: Diagnosis not present

## 2018-12-25 DIAGNOSIS — R972 Elevated prostate specific antigen [PSA]: Secondary | ICD-10-CM | POA: Diagnosis not present

## 2018-12-25 DIAGNOSIS — G544 Lumbosacral root disorders, not elsewhere classified: Secondary | ICD-10-CM | POA: Diagnosis not present

## 2018-12-26 ENCOUNTER — Ambulatory Visit: Payer: Self-pay | Admitting: Interventional Cardiology

## 2019-01-04 ENCOUNTER — Other Ambulatory Visit: Payer: Self-pay

## 2019-01-04 ENCOUNTER — Ambulatory Visit (INDEPENDENT_AMBULATORY_CARE_PROVIDER_SITE_OTHER): Payer: PPO | Admitting: Family Medicine

## 2019-01-04 ENCOUNTER — Encounter: Payer: Self-pay | Admitting: Family Medicine

## 2019-01-04 VITALS — BP 138/76 | Ht 71.0 in | Wt 178.0 lb

## 2019-01-04 DIAGNOSIS — F418 Other specified anxiety disorders: Secondary | ICD-10-CM | POA: Diagnosis not present

## 2019-01-04 NOTE — Progress Notes (Signed)
Virtual Visit via Video Note  I connected with Ronald Robinson on 01/04/19 at  2:00 PM EDT by a video enabled telemedicine application and verified that I am speaking with the correct person using two identifiers.   I discussed the limitations of evaluation and management by telemedicine and the availability of in person appointments. The patient expressed understanding and agreed to proceed.  History of Present Illness: Started on Elavil for situational anxiety. Did well, still having bouts of anxiety when he thinks about his situation. Sleep is better, mood is calmer overall. No AE's, taking medicine daily. No SI or HI.    Observations/Objective: Age approp judgment and insight, nml affect and mood No conversational dyspnea  Assessment and Plan: Situational anxiety Cont Elavil. Fu in 2 mo for reck, will discuss coming off medication at this time. Counseled on exercise to supp tx.  Follow Up Instructions: 10 weeks.   I discussed the assessment and treatment plan with the patient. The patient was provided an opportunity to ask questions and all were answered. The patient agreed with the plan and demonstrated an understanding of the instructions.   The patient was advised to call back or seek an in-person evaluation if the symptoms worsen or if the condition fails to improve as anticipated.  [place], DO

## 2019-01-19 ENCOUNTER — Other Ambulatory Visit: Payer: Self-pay | Admitting: Urology

## 2019-01-19 DIAGNOSIS — R972 Elevated prostate specific antigen [PSA]: Secondary | ICD-10-CM

## 2019-01-25 ENCOUNTER — Telehealth: Payer: Self-pay

## 2019-01-25 NOTE — Telephone Encounter (Signed)
Called pt to set up possible evisit, left message asking pt to call the office.  

## 2019-01-26 ENCOUNTER — Other Ambulatory Visit: Payer: Self-pay | Admitting: Interventional Cardiology

## 2019-01-30 NOTE — Telephone Encounter (Signed)
Called pt to set up possible evisit, left message asking to call the office.  

## 2019-01-31 NOTE — Telephone Encounter (Signed)
Left message on both home and cell phone for patient to call back regarding his appointment tomorrow. Need to discuss transitioning to Virtual Visit.

## 2019-01-31 NOTE — Telephone Encounter (Signed)
Virtual Visit Pre-Appointment Phone Call   TELEPHONE CALL NOTE  Ronald Robinson has been deemed a candidate for a follow-up tele-health visit to limit community exposure during the Covid-19 pandemic. I spoke with the patient via phone to ensure availability of phone/video source, confirm preferred email & phone number, and discuss instructions and expectations.  I reminded Ronald Robinson to be prepared with any vital sign and/or heart rhythm information that could potentially be obtained via home monitoring, at the time of his visit. I reminded Ronald Robinson to expect a phone call prior to his visit.   Patient agrees to consent below.  Cleon Gustin, RN 01/31/2019 1:22 PM   FULL LENGTH CONSENT FOR TELE-HEALTH VISIT   I hereby voluntarily request, consent and authorize [facility] and its employed or contracted physicians, physician assistants, nurse practitioners or other licensed health care professionals (the Practitioner), to provide me with telemedicine health care services (the "Services") as deemed necessary by the treating Practitioner. I acknowledge and consent to receive the Services by the Practitioner via telemedicine. I understand that the telemedicine visit will involve communicating with the Practitioner through live audiovisual communication technology and the disclosure of certain medical information by electronic transmission. I acknowledge that I have been given the opportunity to request an in-person assessment or other available alternative prior to the telemedicine visit and am voluntarily participating in the telemedicine visit.  I understand that I have the right to withhold or withdraw my consent to the use of telemedicine in the course of my care at any time, without affecting my right to future care or treatment, and that the Practitioner or I may terminate the telemedicine visit at any time. I understand that I have the right to inspect all information  obtained and/or recorded in the course of the telemedicine visit and may receive copies of available information for a reasonable fee.  I understand that some of the potential risks of receiving the Services via telemedicine include:  Marland Kitchen Delay or interruption in medical evaluation due to technological equipment failure or disruption; . Information transmitted may not be sufficient (e.g. poor resolution of images) to allow for appropriate medical decision making by the Practitioner; and/or  . In rare instances, security protocols could fail, causing a breach of personal health information.  Furthermore, I acknowledge that it is my responsibility to provide information about my medical history, conditions and care that is complete and accurate to the best of my ability. I acknowledge that Practitioner's advice, recommendations, and/or decision may be based on factors not within their control, such as incomplete or inaccurate data provided by me or distortions of diagnostic images or specimens that may result from electronic transmissions. I understand that the practice of medicine is not an exact science and that Practitioner makes no warranties or guarantees regarding treatment outcomes. I acknowledge that I will receive a copy of this consent concurrently upon execution via email to the email address I last provided but may also request a printed copy by calling the office of [facility].    I understand that my insurance will be billed for this visit.   I have read or had this consent read to me. . I understand the contents of this consent, which adequately explains the benefits and risks of the Services being provided via telemedicine.  . I have been provided ample opportunity to ask questions regarding this consent and the Services and have had my questions answered to my satisfaction. . I give  my informed consent for the services to be provided through the use of telemedicine in my medical care   By participating in this telemedicine visit I agree to the above.

## 2019-01-31 NOTE — Telephone Encounter (Signed)
Patient returned your call.

## 2019-02-01 ENCOUNTER — Encounter: Payer: Self-pay | Admitting: Interventional Cardiology

## 2019-02-01 ENCOUNTER — Telehealth (INDEPENDENT_AMBULATORY_CARE_PROVIDER_SITE_OTHER): Payer: PPO | Admitting: Interventional Cardiology

## 2019-02-01 ENCOUNTER — Other Ambulatory Visit: Payer: Self-pay

## 2019-02-01 VITALS — BP 138/82 | HR 60 | Ht 71.0 in | Wt 181.0 lb

## 2019-02-01 DIAGNOSIS — I25119 Atherosclerotic heart disease of native coronary artery with unspecified angina pectoris: Secondary | ICD-10-CM | POA: Diagnosis not present

## 2019-02-01 DIAGNOSIS — I10 Essential (primary) hypertension: Secondary | ICD-10-CM

## 2019-02-01 DIAGNOSIS — E782 Mixed hyperlipidemia: Secondary | ICD-10-CM

## 2019-02-01 DIAGNOSIS — I4819 Other persistent atrial fibrillation: Secondary | ICD-10-CM | POA: Diagnosis not present

## 2019-02-01 NOTE — Patient Instructions (Signed)
Medication Instructions:  Your physician recommends that you continue on your current medications as directed. Please refer to the Current Medication list given to you today.  If you need a refill on your cardiac medications before your next appointment, please call your pharmacy.   Lab work: Your physician recommends that you return for a FASTING labs: LIPIDS, LFTS, TSH, and CBC   If you have labs (blood work) drawn today and your tests are completely normal, you will receive your results only by: Marland Kitchen MyChart Message (if you have MyChart) OR . A paper copy in the mail If you have any lab test that is abnormal or we need to change your treatment, we will call you to review the results.  Testing/Procedures: None ordered  Follow-Up: At [facility], you and your health needs are our priority.  As part of our continuing mission to provide you with exceptional heart care, we have created designated Provider Care Teams.  These Care Teams include your primary Cardiologist (physician) and Advanced Practice Providers (APPs -  Physician Assistants and Nurse Practitioners) who all work together to provide you with the care you need, when you need it. . You will need a follow up appointment in 6 months.  Please call our office 2 months in advance to schedule this appointment.  You may see Casandra Doffing, MD or one of the following Advanced Practice Providers on your designated Care Team:   . Lyda Jester, PA-C . Dayna Dunn, PA-C . Ermalinda Barrios, PA-C  Any Other Special Instructions Will Be Listed Below (If Applicable).

## 2019-02-01 NOTE — Progress Notes (Signed)
Virtual Visit via Telephone Note   This visit type was conducted due to national recommendations for restrictions regarding the COVID-19 Pandemic (e.g. social distancing) in an effort to limit this patient's exposure and mitigate transmission in our community.  Due to his co-morbid illnesses, this patient is at least at moderate risk for complications without adequate follow up.  This format is felt to be most appropriate for this patient at this time.  The patient did not have access to video technology/had technical difficulties with video requiring transitioning to audio format only (telephone).  All issues noted in this document were discussed and addressed.  No physical exam could be performed with this format.  Please refer to the patient's chart for his  consent to telehealth for [facility].   Evaluation Performed:  Follow-up visit  Date:  02/01/2019   ID:  Ronald Robinson, DOB Feb 20, 1941, MRN [id number]  Patient Location: Home Provider Location: Home  PCP:  [name], DO  Cardiologist:  No primary care provider on file. [place] Electrophysiologist:  None   Chief Complaint:  CAD  History of Present Illness:    Marqual Mi is a 78 y.o. male with with prior h/o HTN who was recently diagnosed with CAD. He initially presented to [facility] on 11/23/16 with a complaint of SSCP. In the ED his EKG initially showed AF with CVR. He was unaware of palpitations or tachycardia. His Troponin was 1.34-2.13. His subsequent EKGs show subtle ST elevations and TWI. Pt was transferred to [facility] for cardiology admission. Patient had a cardiac cath using the right radial approach which showed diffuse multivessel CAD involving the LAD, ramus intermediate, left circumflex, and RCA. EF normal at 55-60%. Pt not amenable to stent placement. CTS consulted and saw patient, they do not feel like CABG is the best treatment, his LAD system not graft able. The LCX and RCA systems are diffusely  diseased and it is not possible to graft beyond the disease. Dr. Cyndia Bent recommended medical therapy. He was placed on ASA, Plavix, Imdur and Lisinopril.  Was readmitted on 12/04/16 with chest pain, but this time new Afib in RVR. Amiodarone was added and Plavix was changed to apixaban and he was discharged after two days.  In early March2018, he had blurry vision. All he could see was the color brown. Discharge diagnosis was "-Bilateral and complete loss of vision is typically a symptom of low blood pressure/pre-syncope, rather than CVA." -Started on Imdur, BB, Ranexa and amiodarone.  -CTA neck showed no evidence of severe vertebrobasilar disease -MR brain negativefor acute CVA -Orthostatic as negative -Symptoms resolved, not a lot canbe added from neurology standpoint, patient already on Eliquis and LDof aspirin.  Stopped Ranexa.In the past, he has some left sided pain that resolves with Tylenol.   In the past, Rare Exertional chest tightness if he walks fast.  Not with regular activity or his job.    The patient does not have symptoms concerning for COVID-19 infection (fever, chills, cough, or new shortness of breath).   In 11/19, he lost his job under difficult circumstances- he has an age discrimination complaint pending.  He had some anxiety.  He took some meds for this.  He is coping with it better now.   Denies : Chest pain. Dizziness. Leg edema. Nitroglycerin use. Orthopnea. Palpitations. Paroxysmal nocturnal dyspnea. Shortness of breath. Syncope.   He walks regularly. No cardiac sx at that time.     Past Medical History:  Diagnosis Date  . CAD  in native artery  11/26/2016  . Enlarged prostate   . Hyperlipidemia LDL goal <70 12/04/2016  . Hypertension    Past Surgical History:  Procedure Laterality Date  . HERNIA REPAIR    . LEFT HEART CATH AND CORONARY ANGIOGRAPHY N/A 11/24/2016   Procedure: Left Heart Cath and Coronary Angiography;  Surgeon: Troy Sine, MD;   Location: [place] [facility];  Service: Cardiovascular;  Laterality: N/A;     Current Meds  Medication Sig  . acetaminophen (TYLENOL) 325 MG tablet Take 2 tablets (650 mg total) by mouth every 4 (four) hours as needed for headache or mild pain.  Marland Kitchen amiodarone (PACERONE) 200 MG tablet Take 1 tablet by mouth once daily  . amitriptyline (ELAVIL) 10 MG tablet Take 1 tablet (10 mg total) by mouth at bedtime.  Marland Kitchen amLODipine (NORVASC) 10 MG tablet Take 10 mg by mouth daily.  Marland Kitchen atorvastatin (LIPITOR) 80 MG tablet TAKE 1 TABLET BY MOUTH ONCE DAILY AT  6PM  . ELIQUIS 5 MG TABS tablet TAKE 1 TABLET BY MOUTH TWICE DAILY  . fexofenadine (ALLEGRA) 180 MG tablet Take 1 tablet (180 mg total) by mouth daily.  . finasteride (PROSCAR) 5 MG tablet Take 5 mg by mouth daily.  . irbesartan (AVAPRO) 150 MG tablet Take 1/2 (one-half) tablet by mouth once daily  . isosorbide mononitrate (IMDUR) 60 MG 24 hr tablet TAKE 1 TABLET BY MOUTH ONCE DAILY  . Multiple Vitamin (MULTIVITAMIN) tablet Take 1 tablet by mouth daily.  . nitroGLYCERIN (NITROSTAT) 0.4 MG SL tablet Place 1 tablet (0.4 mg total) under the tongue every 5 (five) minutes x 3 doses as needed for chest pain.  . pantoprazole (PROTONIX) 40 MG tablet Take 1 tablet (40 mg total) by mouth daily.  . tamsulosin (FLOMAX) 0.4 MG CAPS capsule Take 0.4 mg by mouth daily.      Allergies:   Patient has no known allergies.   Social History   Tobacco Use  . Smoking status: Never Smoker  . Smokeless tobacco: Never Used  Substance Use Topics  . Alcohol use: No  . Drug use: No     Family Hx: The patient's family history includes Coronary artery disease in his brother, father, and sister; Hypertension in his mother.  ROS:   Please see the history of present illness.    Anxiety- decreased All other systems reviewed and are negative.   Prior CV studies:   The following studies were reviewed today:  Labs reviewed  Labs/Other Tests and Data Reviewed:    EKG:   An ECG dated 01/2018  was personally reviewed today and demonstrated:  sinus bradycardia  Recent Labs: 02/10/2018: ALT 29; BUN 26; Creatinine, Ser 0.86; Hemoglobin 13.1; Platelets 197; Potassium 4.2; Sodium 143; TSH 0.737   Recent Lipid Panel Lab Results  Component Value Date/Time   CHOL 134 02/10/2018 07:22 AM   TRIG 22 02/10/2018 07:22 AM   HDL 69 02/10/2018 07:22 AM   CHOLHDL 1.9 02/10/2018 07:22 AM   CHOLHDL 3.0 11/25/2016 02:17 AM   LDLCALC 61 02/10/2018 07:22 AM    Wt Readings from Last 3 Encounters:  02/01/19 181 lb (82.1 kg)  01/04/19 178 lb (80.7 kg)  12/07/18 189 lb 6 oz (85.9 kg)     Objective:    Vital Signs:  BP 138/82   Pulse 60   Ht 5\' 11"  (1.803 m)   Wt 181 lb (82.1 kg)   BMI 25.24 kg/m    VITAL SIGNS:  reviewed GEN:  no acute distress RESPIRATORY:  no SHOB PSYCH:  normal affect exam limited due to phone format  ASSESSMENT & PLAN:    1. CAD/Old MI: No angina.  COntinue aggressive secondary prevention.  2. AFib: No palpitations.  Eliquis for stroke prevention.  TSH needed for Amio monitoring.  3. Hyperlipidemia: LDL 61 in 5/19.  Needs f/u labs. Lipids, liver TSH.  4. HTN: The current medical regimen is effective;  continue present plan and medications.  Checks at home.    COVID-19 Education: The signs and symptoms of COVID-19 were discussed with the patient and how to seek care for testing (follow up with PCP or arrange E-visit).  The importance of social distancing was discussed today.  Time:   Today, I have spent 18 minutes with the patient with telehealth technology discussing the above problems.     Medication Adjustments/Labs and Tests Ordered: Current medicines are reviewed at length with the patient today.  Concerns regarding medicines are outlined above.   Tests Ordered: No orders of the defined types were placed in this encounter.   Medication Changes: No orders of the defined types were placed in this encounter.   Disposition:   Follow up in 6 month(s)  Signed, Larae Grooms, MD  02/01/2019 1:42 PM    [facility]

## 2019-02-06 ENCOUNTER — Other Ambulatory Visit: Payer: Self-pay | Admitting: Family Medicine

## 2019-05-04 ENCOUNTER — Other Ambulatory Visit: Payer: PPO

## 2019-05-22 ENCOUNTER — Other Ambulatory Visit: Payer: Self-pay | Admitting: Interventional Cardiology

## 2019-05-22 MED ORDER — NITROGLYCERIN 0.4 MG SL SUBL: 0.4000 mg | SUBLINGUAL_TABLET | SUBLINGUAL | 4 refills | Status: DC | PRN

## 2019-06-03 ENCOUNTER — Other Ambulatory Visit: Payer: Self-pay | Admitting: Family Medicine

## 2019-07-02 ENCOUNTER — Encounter (HOSPITAL_BASED_OUTPATIENT_CLINIC_OR_DEPARTMENT_OTHER): Payer: Self-pay | Admitting: Adult Health

## 2019-07-02 ENCOUNTER — Other Ambulatory Visit: Payer: Self-pay

## 2019-07-02 ENCOUNTER — Emergency Department (HOSPITAL_BASED_OUTPATIENT_CLINIC_OR_DEPARTMENT_OTHER)
Admission: EM | Admit: 2019-07-02 | Discharge: 2019-07-02 | Disposition: A | Discharge status: Home / Self Care | Payer: PPO | Attending: Emergency Medicine | Admitting: Emergency Medicine

## 2019-07-02 DIAGNOSIS — Z79899 Other long term (current) drug therapy: Secondary | ICD-10-CM | POA: Insufficient documentation

## 2019-07-02 DIAGNOSIS — I2510 Atherosclerotic heart disease of native coronary artery without angina pectoris: Secondary | ICD-10-CM | POA: Diagnosis not present

## 2019-07-02 DIAGNOSIS — M79604 Pain in right leg: Secondary | ICD-10-CM | POA: Insufficient documentation

## 2019-07-02 DIAGNOSIS — I10 Essential (primary) hypertension: Secondary | ICD-10-CM | POA: Insufficient documentation

## 2019-07-02 MED ORDER — LIDOCAINE 5 % EX PTCH: 1.0000 | MEDICATED_PATCH | CUTANEOUS | 0 refills | Status: DC

## 2019-07-02 NOTE — ED Triage Notes (Signed)
Presents with 2 months of right leg pain that goes into right hip. He reports that he has to go back to work and it is painful and that is why he came in today. PAin is described as an ache.

## 2019-07-02 NOTE — ED Provider Notes (Signed)
[place] EMERGENCY DEPARTMENT Provider Note   CSN: [id number] Arrival date & time: 07/02/19  1741     History   Chief Complaint Chief Complaint  Patient presents with  . Leg Pain    HPI Ronald Robinson is a 78 y.o. male.     The history is provided by the patient. No language interpreter was used.  Leg Pain Location:  Leg Injury: no   Leg location:  R leg, R upper leg and R lower leg Pain details:    Quality:  Aching, dull and sharp   Radiates to:  Does not radiate   Severity:  Moderate   Onset quality:  Gradual   Duration:  12 months   Timing:  Intermittent   Progression:  Waxing and waning Chronicity:  Chronic Dislocation: no   Tetanus status:  Unknown Prior injury to area:  No Relieved by:  Acetaminophen and movement Worsened by:  Nothing Ineffective treatments:  None tried Associated symptoms: no back pain, no decreased ROM, no fatigue, no fever, no itching, no muscle weakness, no neck pain, no numbness, no stiffness and no swelling     Past Medical History:  Diagnosis Date  . CAD in native artery  11/26/2016  . Enlarged prostate   . Hyperlipidemia LDL goal <70 12/04/2016  . Hypertension     Patient Active Problem List   Diagnosis Date Noted  . Situational anxiety 01/04/2019  . Cough due to ACE inhibitor 08/01/2017  . Unstable angina ([place]) 12/04/2016  . Hyperlipidemia LDL goal <70 12/04/2016  . Coronary artery disease due to lipid rich plaque 11/26/2016  . S/P cardiac cath: Severe 3 vessel disease, not a candidate for CABG. Medical treatment 11/26/2016  . Non-ST elevation (NSTEMI) myocardial infarction ([place]) 11/24/2016  . Family history of coronary artery disease 11/24/2016  . PAF (paroxysmal atrial fibrillation) ([place]) 11/24/2016  . BPH (benign prostatic hyperplasia) 11/24/2016  . Essential hypertension 01/12/2011  . KNEE PAIN, LEFT, ACUTE 01/12/2011    Past Surgical History:  Procedure Laterality Date  . HERNIA REPAIR    . LEFT  HEART CATH AND CORONARY ANGIOGRAPHY N/A 11/24/2016   Procedure: Left Heart Cath and Coronary Angiography;  Surgeon: Troy Sine, MD;  Location: [place] [facility];  Service: Cardiovascular;  Laterality: N/A;        Home Medications    Prior to Admission medications   Medication Sig Start Date End Date Taking? Authorizing Provider  acetaminophen (TYLENOL) 325 MG tablet Take 2 tablets (650 mg total) by mouth every 4 (four) hours as needed for headache or mild pain. 11/26/16   Delos Haring, PA-C  amiodarone (PACERONE) 200 MG tablet Take 1 tablet by mouth once daily 01/26/19   Jettie Booze, MD  amitriptyline (ELAVIL) 10 MG tablet Take 1 tablet (10 mg total) by mouth at bedtime. 12/07/18   [name], DO  amLODipine (NORVASC) 10 MG tablet Take 10 mg by mouth daily.    [provider]  atorvastatin (LIPITOR) 80 MG tablet TAKE 1 TABLET BY MOUTH ONCE DAILY AT  6PM 06/04/19   Wendling, Crosby Oyster, DO  ELIQUIS 5 MG TABS tablet TAKE 1 TABLET BY MOUTH TWICE DAILY 10/16/18   Jettie Booze, MD  fexofenadine (ALLEGRA) 180 MG tablet Take 1 tablet (180 mg total) by mouth daily. 03/21/17   [name], DO  finasteride (PROSCAR) 5 MG tablet Take 5 mg by mouth daily.    [provider]  irbesartan (AVAPRO) 150 MG tablet  Take 1/2 (one-half) tablet by mouth once daily 01/26/19   Jettie Booze, MD  isosorbide mononitrate (IMDUR) 60 MG 24 hr tablet TAKE 1 TABLET BY MOUTH ONCE DAILY 07/10/18   Lyda Jester M, PA-C  Multiple Vitamin (MULTIVITAMIN) tablet Take 1 tablet by mouth daily.    [provider]  nitroGLYCERIN (NITROSTAT) 0.4 MG SL tablet Place 1 tablet (0.4 mg total) under the tongue every 5 (five) minutes x 3 doses as needed for chest pain. 05/22/19   Jettie Booze, MD  pantoprazole (PROTONIX) 40 MG tablet Take 1 tablet (40 mg total) by mouth daily. 07/10/18   Jettie Booze, MD  tamsulosin (FLOMAX) 0.4 MG CAPS capsule  Take 0.4 mg by mouth daily.     [provider]    Family History Family History  Problem Relation Age of Onset  . Coronary artery disease Father   . Coronary artery disease Sister   . Coronary artery disease Brother   . Hypertension Mother     Social History Social History   Tobacco Use  . Smoking status: Never Smoker  . Smokeless tobacco: Never Used  Substance Use Topics  . Alcohol use: No  . Drug use: No     Allergies   Patient has no known allergies.   Review of Systems Review of Systems  Constitutional: Negative for chills, diaphoresis, fatigue and fever.  HENT: Negative for congestion and rhinorrhea.   Eyes: Negative for visual disturbance.  Respiratory: Negative for cough, chest tightness, shortness of breath and wheezing.   Cardiovascular: Negative for chest pain.  Gastrointestinal: Negative for abdominal pain, constipation, diarrhea, nausea and vomiting.  Genitourinary: Negative for flank pain and frequency.  Musculoskeletal: Negative for back pain, myalgias, neck pain, neck stiffness and stiffness.  Skin: Negative for itching, rash and wound.  Neurological: Negative for light-headedness, numbness and headaches.  Psychiatric/Behavioral: Negative for agitation and confusion.     Physical Exam Updated Vital Signs BP (!) 171/84 (BP Location: Right Arm)   Pulse 70   Temp 98 F (36.7 C) (Oral)   Resp 18   SpO2 100%   Physical Exam Vitals signs and nursing note reviewed.  Constitutional:      General: He is not in acute distress.    Appearance: He is well-developed. He is not ill-appearing, toxic-appearing or diaphoretic.  HENT:     Head: Normocephalic and atraumatic.     Nose: No congestion or rhinorrhea.     Mouth/Throat:     Mouth: Mucous membranes are moist.     Pharynx: No oropharyngeal exudate or posterior oropharyngeal erythema.  Eyes:     Conjunctiva/sclera: Conjunctivae normal.     Pupils: Pupils are equal, round, and reactive to  light.  Neck:     Musculoskeletal: Neck supple. No muscular tenderness.  Cardiovascular:     Rate and Rhythm: Normal rate and regular rhythm.     Heart sounds: No murmur.  Pulmonary:     Effort: Pulmonary effort is normal. No respiratory distress.     Breath sounds: Normal breath sounds. No wheezing, rhonchi or rales.  Chest:     Chest wall: No tenderness.  Abdominal:     General: Abdomen is flat.     Palpations: Abdomen is soft.     Tenderness: There is no abdominal tenderness. There is no right CVA tenderness or left CVA tenderness.  Musculoskeletal:        General: Tenderness present. No swelling or signs of injury.  Right lower leg: No edema.     Left lower leg: No edema.  Skin:    General: Skin is warm and dry.     Capillary Refill: Capillary refill takes less than 2 seconds.  Neurological:     General: No focal deficit present.     Mental Status: He is alert and oriented to person, place, and time.     Coordination: Coordination normal.     Gait: Gait normal.  Psychiatric:        Mood and Affect: Mood normal.      ED Treatments / Results  Labs (all labs ordered are listed, but only abnormal results are displayed) Labs Reviewed - No data to display  EKG None  Radiology No results found.  Procedures Procedures (including critical care time)  Medications Ordered in ED Medications - No data to display   Initial Impression / Assessment and Plan / ED Course  I have reviewed the triage vital signs and the nursing notes.  Pertinent labs & imaging results that were available during my care of the patient were reviewed by me and considered in my medical decision making (see chart for details).        Ronald Robinson is a 78 y.o. male with a past medical history significant for CAD, hypertension, hyperlipidemia, paroxysmal atrial fibrillation on Eliquis, and chronic right leg pain who presents with right leg pain.  Patient reports that he started a new job  tomorrow and wanted to get "checked out" for his labrum restarts.  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  He reports that for a long time, years, he has had right leg pains in his shin and his lateral right hip/femur area.  He thinks it is muscular.  He reports that he has used alcohol swabs on it recently and it has helped as well as using a new orthotic which is helped.  He does not take ibuprofen but reports that Tylenol takes the pain away normally.  He denies any new trauma or injuries and does not think he has any fracture or infection.  He denies any history of DVT or PE and is on the blood thinners.  He also reports that he has a "spur" on his foot that he wanted removed and would like a number for podiatrist.  On exam, patient had some mild tenderness in the right lateral proximal thigh area.  No numbness, tingling, or weakness.  Good pulses.  No calf tenderness or ankle tenderness.  No erythema or swelling seen.  No induration or lacerations.  Patient has a bump on the bottom of his foot which is covered and moleskin.  He denies any drainage.  He denies other complaints.  Lungs clear exam otherwise unremarkable.  We had a shared decision made conversation about work-up.  Patient was not felt to need imaging as he had no trauma and we have a low suspicion for fracture.  Nothing he has told this indicates infection.  No symptoms of DVT at this time and is he is on blood thinners.  Patient is interested in following with a sports medicine physician as well as a podiatrist and he agrees to get a Lidoderm patch for the lateral right thigh pain.  Patient will take his over-the-counter Tylenol help with the discomfort and will follow-up with PCP.  He agreed with plan of care and was discharged in good condition.      Final Clinical Impressions(s) / ED Diagnoses  Final diagnoses:  Right leg pain    ED Discharge Orders         Ordered    lidocaine (LIDODERM) 5 %  Every 24 hours      07/02/19 1856          Clinical Impression: 1. Right leg pain     Disposition: Discharge  Condition: Good  I have discussed the results, Dx and Tx plan with the pt(& family if present). He/she/they expressed understanding and agree(s) with the plan. Discharge instructions discussed at great length. Strict return precautions discussed and pt &/or family have verbalized understanding of the instructions. No further questions at time of discharge.    New Prescriptions   LIDOCAINE (LIDODERM) 5 %    Place 1 patch onto the skin daily. Remove & Discard patch within 12 hours or as directed by MD    Follow Up: Rosemarie Ax, MD [address] [place] [address] [phone]   with sports medicine  Edrick Kins, [address] [place] [address] [phone]   with podiatry  [name], DO [address] [place] [address] [phone]     [facility] [place] [address] [id number] [place] [phone]        , Gwenyth Allegra, MD 07/02/19 1901

## 2019-07-02 NOTE — Discharge Instructions (Addendum)
Your history and exam today are consistent with musculoskeletal type pain.  Please continue using the insert in your shoe and you may use the Lidoderm patch to help with the lateral upper leg discomfort.  Please do stretches and stay hydrated.  Please use the Tylenol to help with inflammation.  Please follow-up with both the podiatry team for your foot and the sports medicine team for the leg.  If any symptoms change or worsen, please return to the nearest emergency department.  Congratulations on starting your new job tomorrow.

## 2019-07-02 NOTE — ED Notes (Signed)
ED Provider at bedside. 

## 2019-07-15 ENCOUNTER — Other Ambulatory Visit: Payer: Self-pay | Admitting: Interventional Cardiology

## 2019-08-01 ENCOUNTER — Other Ambulatory Visit: Payer: Self-pay | Admitting: Interventional Cardiology

## 2019-08-07 ENCOUNTER — Other Ambulatory Visit: Payer: Self-pay | Admitting: Interventional Cardiology

## 2019-08-07 NOTE — Telephone Encounter (Signed)
Pt last saw Dr Irish Lack 02/01/19 telemedicine visit Covid-19, last labs 02/10/18 Creat 0.86, pt is overdue for labwork.Pt has scheduled follow-up appt with Dr Irish Lack on 09/11/19, placed note on appt to draw CBC and BMP at [facility] f/u Eliquis labwork.  Age 78, weight 82.1kg, based on specified criteria pt is on appropriate dosage of Eliquis 5mg  BID.  Will refill rx to get pt to f/u appt in Dec.

## 2019-08-13 ENCOUNTER — Other Ambulatory Visit: Payer: Self-pay | Admitting: Interventional Cardiology

## 2019-08-28 ENCOUNTER — Other Ambulatory Visit: Payer: Self-pay | Admitting: Family Medicine

## 2019-09-05 ENCOUNTER — Other Ambulatory Visit: Payer: Self-pay

## 2019-09-05 DIAGNOSIS — Z20822 Contact with and (suspected) exposure to covid-19: Secondary | ICD-10-CM

## 2019-09-06 LAB — NOVEL CORONAVIRUS, NAA: SARS-CoV-2, NAA: DETECTED — AB

## 2019-09-10 NOTE — Progress Notes (Deleted)
Cardiology Office Note   Date:  09/10/2019   ID:  Ronald Robinson, DOB 1941/03/08, MRN [id number]  PCP:  [name], DO    No chief complaint on file.  CAD  Wt Readings from Last 3 Encounters:  02/01/19 181 lb (82.1 kg)  01/04/19 178 lb (80.7 kg)  12/07/18 189 lb 6 oz (85.9 kg)       History of Present Illness: Ronald Robinson is a 78 y.o. male   with prior h/o HTN who was recently diagnosed with CAD. He initially presented to [facility] on 11/23/16 with a complaint of SSCP. In the ED his EKG initially showed AF with CVR. He was unaware of palpitations or tachycardia. His Troponin was 1.34-2.13. His subsequent EKGs show subtle ST elevations and TWI. Pt was transferred to [facility] for cardiology admission. Patient had a cardiac cath using the right radial approach which showed diffuse multivessel CAD involving the LAD, ramus intermediate, left circumflex, and RCA. EF normal at 55-60%. Pt not amenable to stent placement. CTS consulted and saw patient, they do not feel like CABG is the best treatment, his LAD system not graft able. The LCX and RCA systems are diffusely diseased and it is not possible to graft beyond the disease. Dr. Cyndia Bent recommended medical therapy. He was placed on ASA, Plavix, Imdur and Lisinopril.  Was readmitted on 12/04/16 with chest pain, but this time new Afib in RVR. Amiodarone was added and Plavix was changed to apixaban and he was discharged after two days.  In early March2018, he had blurry vision. All he could see was the color brown. Discharge diagnosis was "-Bilateral and complete loss of vision is typically a symptom of low blood pressure/pre-syncope, rather than CVA." -Started on Imdur, BB, Ranexa and amiodarone.  -CTA neck showed no evidence of severe vertebrobasilar disease -MR brain negativefor acute CVA -Orthostatic as negative -Symptoms resolved, not a lot canbe added from neurology standpoint, patient already on Eliquis and  LDof aspirin.  Stopped Ranexa.In the past, he has some left sided pain that resolves with Tylenol.   In the past, Rare Exertional chest tightness if he walks fast. Not with regular activity or his job.   The patient does not have symptoms concerning for COVID-19 infection (fever, chills, cough, or new shortness of breath).   In 11/19, he lost his job under difficult circumstances- he has an age discrimination complaint pending.  He had some anxiety.  He took some meds for this.  He is coping with it better now.   He was walking regularly without sx at the early 2020 visit.    Past Medical History:  Diagnosis Date  . CAD in native artery  11/26/2016  . Enlarged prostate   . Hyperlipidemia LDL goal <70 12/04/2016  . Hypertension     Past Surgical History:  Procedure Laterality Date  . HERNIA REPAIR    . LEFT HEART CATH AND CORONARY ANGIOGRAPHY N/A 11/24/2016   Procedure: Left Heart Cath and Coronary Angiography;  Surgeon: Troy Sine, MD;  Location: [facility];  Service: Cardiovascular;  Laterality: N/A;     Current Outpatient Medications  Medication Sig Dispense Refill  . acetaminophen (TYLENOL) 325 MG tablet Take 2 tablets (650 mg total) by mouth every 4 (four) hours as needed for headache or mild pain. 30 tablet 2  . amiodarone (PACERONE) 200 MG tablet Take 1 tablet by mouth once daily 90 tablet 0  . amitriptyline (ELAVIL) 10 MG tablet Take 1  tablet (10 mg total) by mouth at bedtime. 30 tablet 3  . amLODipine (NORVASC) 10 MG tablet Take 10 mg by mouth daily.    Marland Kitchen apixaban (ELIQUIS) 5 MG TABS tablet Take 1 tablet (5 mg total) by mouth 2 (two) times daily. Overdue for follow-up labwork. 180 tablet 0  . atorvastatin (LIPITOR) 80 MG tablet TAKE 1 TABLET BY MOUTH ONCE DAILY AT  6PM 90 tablet 0  . fexofenadine (ALLEGRA) 180 MG tablet Take 1 tablet (180 mg total) by mouth daily. 30 tablet 0  . finasteride (PROSCAR) 5 MG tablet Take 5 mg by mouth daily.    .  irbesartan (AVAPRO) 150 MG tablet Take 1/2 (one-half) tablet by mouth once daily 45 tablet 0  . isosorbide mononitrate (IMDUR) 60 MG 24 hr tablet Take 1 tablet by mouth once daily 90 tablet 1  . lidocaine (LIDODERM) 5 % Place 1 patch onto the skin daily. Remove & Discard patch within 12 hours or as directed by MD 15 patch 0  . Multiple Vitamin (MULTIVITAMIN) tablet Take 1 tablet by mouth daily.    . nitroGLYCERIN (NITROSTAT) 0.4 MG SL tablet Place 1 tablet (0.4 mg total) under the tongue every 5 (five) minutes x 3 doses as needed for chest pain. 25 tablet 4  . pantoprazole (PROTONIX) 40 MG tablet Take 1 tablet (40 mg total) by mouth daily. 90 tablet 1  . tamsulosin (FLOMAX) 0.4 MG CAPS capsule Take 0.4 mg by mouth daily.      No current facility-administered medications for this visit.     Allergies:   Patient has no known allergies.    Social History:  The patient  reports that he has never smoked. He has never used smokeless tobacco. He reports that he does not drink alcohol or use drugs.   Family History:  The patient's ***family history includes Coronary artery disease in his brother, father, and sister; Hypertension in his mother.    ROS:  Please see the history of present illness.   Otherwise, review of systems are positive for ***.   All other systems are reviewed and negative.    PHYSICAL EXAM: VS:  There were no vitals taken for this visit. , BMI There is no height or weight on file to calculate BMI. GEN: Well nourished, well developed, in no acute distress  HEENT: normal  Neck: no JVD, carotid bruits, or masses Cardiac: ***RRR; no murmurs, rubs, or gallops,no edema  Respiratory:  clear to auscultation bilaterally, normal work of breathing GI: soft, nontender, nondistended, + BS MS: no deformity or atrophy  Skin: warm and dry, no rash Neuro:  Strength and sensation are intact Psych: euthymic mood, full affect   EKG:   The ekg ordered today demonstrates ***   Recent  Labs: No results found for requested labs within last 8760 hours.   Lipid Panel    Component Value Date/Time   CHOL 134 02/10/2018 0722   TRIG 22 02/10/2018 0722   HDL 69 02/10/2018 0722   CHOLHDL 1.9 02/10/2018 0722   CHOLHDL 3.0 11/25/2016 0217   VLDL 7 11/25/2016 0217   LDLCALC 61 02/10/2018 0722     Other studies Reviewed: Additional studies/ records that were reviewed today with results demonstrating: ***.   ASSESSMENT AND PLAN:  1. CAD/Old MI: 2. AFib:  3. Hyperlipidemia: 4. HTN:   Current medicines are reviewed at length with the patient today.  The patient concerns regarding his medicines were addressed.  The following changes have been made:  No change***  Labs/ tests ordered today include: *** No orders of the defined types were placed in this encounter.   Recommend 150 minutes/week of aerobic exercise Low fat, low carb, high fiber diet recommended  Disposition:   FU in ***   Signed, Larae Grooms, MD  09/10/2019 12:14 AM    [facility], [place], [place]  [address] Phone: [phone]; Fax: [phone]

## 2019-09-11 ENCOUNTER — Ambulatory Visit: Payer: PPO | Admitting: Interventional Cardiology

## 2019-09-14 ENCOUNTER — Ambulatory Visit: Payer: Self-pay | Admitting: *Deleted

## 2019-09-14 NOTE — Telephone Encounter (Signed)
   Reason for Disposition . Health Information question, no triage required and triager able to answer question  Answer Assessment - Initial Assessment Questions 1. REASON FOR CALL or QUESTION: "What is your reason for calling today?" or "How can I best help you?" or "What question do you have that I can help answer?"     Was tested on 09/05/2019.   How long does he have to isolate?    Today would be his last day per the 10 day quarantine protocol.   Never had symptoms and still does not.   Thanked me for my help.  Protocols used: INFORMATION ONLY CALL - NO TRIAGE-A-AH

## 2019-09-17 ENCOUNTER — Other Ambulatory Visit: Payer: Self-pay

## 2019-09-17 DIAGNOSIS — Z20822 Contact with and (suspected) exposure to covid-19: Secondary | ICD-10-CM

## 2019-09-18 LAB — NOVEL CORONAVIRUS, NAA: SARS-CoV-2, NAA: NOT DETECTED

## 2019-09-19 ENCOUNTER — Telehealth: Payer: Self-pay | Admitting: Family Medicine

## 2019-09-19 NOTE — Telephone Encounter (Signed)
Negative COVID results given. Patient results "NOT Detected." Caller expressed understanding. ° °

## 2019-09-20 ENCOUNTER — Telehealth: Payer: Self-pay | Admitting: Family Medicine

## 2019-09-20 NOTE — Telephone Encounter (Signed)
Reason for CRM: Patient wife called to say that he need to go back to work but boss want someone to call him with the test results of his recent covid test.  Lindaann Pascal [phone]. Patient can be reached at Ph# [phone]

## 2019-09-21 NOTE — Telephone Encounter (Signed)
Patient called back and 2 identifiers confirmed, he gave verbal consent for his covid 19 test from 09-17-2019 to be called in to his employer [employer]. Called [employer] to inform there was no covid 19 detected on patient's sample from 09-17-2019.

## 2019-09-21 NOTE — Telephone Encounter (Signed)
Attempted to contact pt to obtain verbal authorization to release information. Spoke with pt's wife and explained request to her. She states she will try to contact pt at work to have him call us with verbal authorization. Spoke with pt's supervisor below, he states he just needs a verbal from [place] of most recent COVID result. He is not able to get pt to phone as he is currently in quarantine himself. Awaiting verbal authorization from pt directly to release result to employer.

## 2019-09-23 ENCOUNTER — Other Ambulatory Visit: Payer: Self-pay | Admitting: Cardiology

## 2019-11-01 NOTE — Progress Notes (Signed)
Cardiology Office Note   Date:  11/02/2019   ID:  Ronald Robinson, DOB [birth year], MRN [id number]  PCP:  Ronald Pal, DO    No chief complaint on file.  CAD  Wt Readings from Last 3 Encounters:  11/02/19 203 lb 9.6 oz (92.4 kg)  02/01/19 181 lb (82.1 kg)  01/04/19 178 lb (80.7 kg)       History of Present Illness: Ronald Robinson is a 79 y.o. male  with prior h/o HTN who was recently diagnosed with CAD. He initially presented to [facility] on 11/23/16 with a complaint of SSCP. In the ED his EKG initially showed AF with CVR. He was unaware of palpitations or tachycardia. His Troponin was 1.34-2.13. His subsequent EKGs show subtle ST elevations and TWI. Pt was transferred to [facility] for cardiology admission. Patient had a cardiac cath using the right radial approach which showed diffuse multivessel CAD involving the LAD, ramus intermediate, left circumflex, and RCA. EF normal at 55-60%. Pt not amenable to stent placement. CTS consulted and saw patient, they do not feel like CABG is the best treatment, his LAD system not graft able. The LCX and RCA systems are diffusely diseased and it is not possible to graft beyond the disease. Dr. Cyndia Bent recommended medical therapy. He was placed on ASA, Plavix, Imdur and Lisinopril.  Was readmitted on 12/04/16 with chest pain, but this time new Afib in RVR. Amiodarone was added and Plavix was changed to apixaban and he was discharged after two days.  In early March2018, he had blurry vision. All he could see was the color brown. Discharge diagnosis was "-Bilateral and complete loss of vision is typically a symptom of low blood pressure/pre-syncope, rather than CVA." -Started on Imdur, BB, Ranexa and amiodarone.  -CTA neck showed no evidence of severe vertebrobasilar disease -MR brain negativefor acute CVA -Orthostatic as negative -Symptoms resolved, not a lot canbe added from neurology standpoint, patient already on Eliquis and  LDof aspirin.  Stopped Ranexa.In the past, he has some left sided pain that resolves with Tylenol.   In the past, Rare Exertional chest tightness if he walks fast. Not with regular activity or his job.   The patient does not have symptoms concerning for COVID-19 infection (fever, chills, cough, or new shortness of breath).   In 11/19, he lost his job under difficult circumstances- he has an age discrimination complaint pending.  He had some anxiety.  He took some meds for this.  He is coping with it better now.   He tested positive for COVID in 08/2019. He tested negative in 09/2019.  He was asymptomatic.   Since the last visit, he has felt well.  Denies : Chest pain. Dizziness. Leg edema. Nitroglycerin use. Orthopnea. Palpitations. Paroxysmal nocturnal dyspnea. Shortness of breath. Syncope.   Still has not had a resolution to his work situation.       Past Medical History:  Diagnosis Date  . CAD in native artery  11/26/2016  . Enlarged prostate   . Hyperlipidemia LDL goal <70 12/04/2016  . Hypertension     Past Surgical History:  Procedure Laterality Date  . HERNIA REPAIR    . LEFT HEART CATH AND CORONARY ANGIOGRAPHY N/A 11/24/2016   Procedure: Left Heart Cath and Coronary Angiography;  Surgeon: Ronald Sine, MD;  Location: [facility];  Service: Cardiovascular;  Laterality: N/A;     Current Outpatient Medications  Medication Sig Dispense Refill  . acetaminophen (TYLENOL) 325 MG tablet  Take 2 tablets (650 mg total) by mouth every 4 (four) hours as needed for headache or mild pain. 30 tablet 2  . amiodarone (PACERONE) 200 MG tablet Take 1 tablet by mouth once daily 90 tablet 0  . amitriptyline (ELAVIL) 10 MG tablet Take 1 tablet (10 mg total) by mouth at bedtime. 30 tablet 3  . amLODipine (NORVASC) 10 MG tablet Take 10 mg by mouth daily.    Marland Kitchen apixaban (ELIQUIS) 5 MG TABS tablet Take 1 tablet (5 mg total) by mouth 2 (two) times daily. Overdue for follow-up  labwork. 180 tablet 0  . atorvastatin (LIPITOR) 80 MG tablet TAKE 1 TABLET BY MOUTH ONCE DAILY AT  6PM 90 tablet 0  . fexofenadine (ALLEGRA) 180 MG tablet Take 1 tablet (180 mg total) by mouth daily. 30 tablet 0  . finasteride (PROSCAR) 5 MG tablet Take 5 mg by mouth daily.    . irbesartan (AVAPRO) 150 MG tablet Take 1/2 (one-half) tablet by mouth once daily 45 tablet 0  . isosorbide mononitrate (IMDUR) 60 MG 24 hr tablet Take 1 tablet by mouth once daily 90 tablet 1  . lidocaine (LIDODERM) 5 % Place 1 patch onto the skin daily. Remove & Discard patch within 12 hours or as directed by MD 15 patch 0  . Multiple Vitamin (MULTIVITAMIN) tablet Take 1 tablet by mouth daily.    . nitroGLYCERIN (NITROSTAT) 0.4 MG SL tablet Place 1 tablet (0.4 mg total) under the tongue every 5 (five) minutes x 3 doses as needed for chest pain. 25 tablet 4  . pantoprazole (PROTONIX) 40 MG tablet Take 1 tablet by mouth once daily 90 tablet 0  . tamsulosin (FLOMAX) 0.4 MG CAPS capsule Take 0.4 mg by mouth daily.      No current facility-administered medications for this visit.    Allergies:   Patient has no known allergies.    Social History:  The patient  reports that he has never smoked. He has never used smokeless tobacco. He reports that he does not drink alcohol or use drugs.   Family History:  The patient's family history includes Coronary artery disease in his brother, father, and sister; Hypertension in his mother.    ROS:  Please see the history of present illness.   Otherwise, review of systems are positive for .   All other systems are reviewed and negative.    PHYSICAL EXAM: VS:  BP 140/86   Pulse 70   Ht 5\' 11"  (1.803 m)   Wt 203 lb 9.6 oz (92.4 kg)   SpO2 97%   BMI 28.40 kg/m  , BMI Body mass index is 28.4 kg/m. GEN: Well nourished, well developed, in no acute distress  HEENT: normal  Neck: no JVD, carotid bruits, or masses Cardiac: RRR; no murmurs, rubs, or gallops,no edema  Respiratory:   clear to auscultation bilaterally, normal work of breathing GI: soft, nontender, nondistended, + BS MS: no deformity or atrophy  Skin: warm and dry, no rash Neuro:  Strength and sensation are intact Psych: euthymic mood, full affect   EKG:   The ekg ordered today demonstrates NSR, no ST changes   Recent Labs: No results found for requested labs within last 8760 hours.   Lipid Panel    Component Value Date/Time   CHOL 134 02/10/2018 0722   TRIG 22 02/10/2018 0722   HDL 69 02/10/2018 0722   CHOLHDL 1.9 02/10/2018 0722   CHOLHDL 3.0 11/25/2016 0217   VLDL 7 11/25/2016 0217  [place] 61 02/10/2018 0722     Other studies Reviewed: Additional studies/ records that were reviewed today with results demonstrating: labs reviewed.   ASSESSMENT AND PLAN:  1. CAD/Old MI: No angina. COntinue aggressive seocndary prevention. 2. AFib: In NSR.  Check liver tests and TSH.  CBC since on Eliquis. 3. Hyperlipidemia: LDL 61 in 2019.  Recheck when fasting.  4. HTN: Borderline control.  He is going to try to take it at home.    Current medicines are reviewed at length with the patient today.  The patient concerns regarding his medicines were addressed.  The following changes have been made:  No change  Labs/ tests ordered today include:  No orders of the defined types were placed in this encounter.   Recommend 150 minutes/week of aerobic exercise Low fat, low carb, high fiber diet recommended  Disposition:   FU in 9 months   Signed, Larae Grooms, MD  11/02/2019 4:36 PM    [facility] [place], [place], [place]  [address] Phone: [phone]; Fax: [phone]

## 2019-11-02 ENCOUNTER — Ambulatory Visit (INDEPENDENT_AMBULATORY_CARE_PROVIDER_SITE_OTHER): Payer: PPO | Admitting: Interventional Cardiology

## 2019-11-02 ENCOUNTER — Encounter: Payer: Self-pay | Admitting: Interventional Cardiology

## 2019-11-02 ENCOUNTER — Other Ambulatory Visit: Payer: Self-pay

## 2019-11-02 VITALS — BP 140/86 | HR 70 | Ht 71.0 in | Wt 203.6 lb

## 2019-11-02 DIAGNOSIS — I252 Old myocardial infarction: Secondary | ICD-10-CM | POA: Diagnosis not present

## 2019-11-02 DIAGNOSIS — I10 Essential (primary) hypertension: Secondary | ICD-10-CM | POA: Diagnosis not present

## 2019-11-02 DIAGNOSIS — E782 Mixed hyperlipidemia: Secondary | ICD-10-CM

## 2019-11-02 DIAGNOSIS — I25119 Atherosclerotic heart disease of native coronary artery with unspecified angina pectoris: Secondary | ICD-10-CM | POA: Diagnosis not present

## 2019-11-02 DIAGNOSIS — I4819 Other persistent atrial fibrillation: Secondary | ICD-10-CM

## 2019-11-02 NOTE — Patient Instructions (Signed)
Medication Instructions:  Your physician recommends that you continue on your current medications as directed. Please refer to the Current Medication list given to you today.  *If you need a refill on your cardiac medications before your next appointment, please call your pharmacy*  Lab Work: None ordered  If you have labs (blood work) drawn today and your tests are completely normal, you will receive your results only by: Marland Kitchen MyChart Message (if you have MyChart) OR . A paper copy in the mail If you have any lab test that is abnormal or we need to change your treatment, we will call you to review the results.  Testing/Procedures: None ordered  Follow-Up: At [facility], you and your health needs are our priority.  As part of our continuing mission to provide you with exceptional heart care, we have created designated Provider Care Teams.  These Care Teams include your primary Cardiologist (physician) and Advanced Practice Providers (APPs -  Physician Assistants and Nurse Practitioners) who all work together to provide you with the care you need, when you need it.  Your next appointment:   9 month(s)  The format for your next appointment:   In Person  Provider:   You may see Larae Grooms, MD or one of the following Advanced Practice Providers on your designated Care Team:    Melina Copa, PA-C  Ermalinda Barrios, PA-C   Other Instructions

## 2019-11-04 ENCOUNTER — Other Ambulatory Visit: Payer: Self-pay | Admitting: Interventional Cardiology

## 2019-11-11 ENCOUNTER — Other Ambulatory Visit: Payer: Self-pay | Admitting: Interventional Cardiology

## 2019-11-12 ENCOUNTER — Telehealth: Payer: Self-pay | Admitting: Interventional Cardiology

## 2019-11-12 ENCOUNTER — Telehealth: Payer: Self-pay | Admitting: Family Medicine

## 2019-11-12 DIAGNOSIS — I4819 Other persistent atrial fibrillation: Secondary | ICD-10-CM

## 2019-11-12 DIAGNOSIS — I10 Essential (primary) hypertension: Secondary | ICD-10-CM

## 2019-11-12 DIAGNOSIS — E782 Mixed hyperlipidemia: Secondary | ICD-10-CM

## 2019-11-12 NOTE — Telephone Encounter (Signed)
[place]  Calling to get verbal consent that it's okay for her husband to take the coivd shot due to his medications.

## 2019-11-12 NOTE — Telephone Encounter (Signed)
Attempted to contact patient but the number was not working. Left message with the granddaughter for patient to call back.

## 2019-11-12 NOTE — Telephone Encounter (Signed)
November 12, 2019 Me 11:23 AM Note   Spoke to patient's wife (DPR on file). Patient will come in for labs in the morning. Refill for Eliquis sent in.    Me 9:09 AM Note   Attempted to contact patient but the number was not working. Left message with the granddaughter for patient to call back.      8:11 AM [place], Dessie Coma, RN routed this conversation to Me  Brynda Peon, RN 8:11 AM Note   Pt last saw Dr Irish Lack 11/02/19, last labs 02/10/18 Creat 0.86, pt is overdue for labwork. There was a note on pt's appt with Dr Irish Lack on 11/02/19 that pt needed CBC and BMP labwork to follow-up Eliquis.  No documentation this labwork was done.  Pt age 79, weight 92.4kg, based on specified criteria pt is on appropriate dosage of Eliquis 5mg  BID.  Unable to refill due to pt being overdue for labwork will forward to Dr Hassell Done nurse to see if they want to refill or call pt in for overdue labwork since we have already done this once before and it was missed at his last appt.

## 2019-11-12 NOTE — Telephone Encounter (Signed)
Pt recently seen by Dr. Irish Lack

## 2019-11-12 NOTE — Telephone Encounter (Signed)
Pt last saw Dr Irish Lack 11/02/19, last labs 02/10/18 Creat 0.86, pt is overdue for labwork. There was a note on pt's appt with Dr Irish Lack on 11/02/19 that pt needed CBC and BMP labwork to follow-up Eliquis.  No documentation this labwork was done.  Pt age 79, weight 92.4kg, based on specified criteria pt is on appropriate dosage of Eliquis 5mg  BID.  Unable to refill due to pt being overdue for labwork will forward to Dr Hassell Done nurse to see if they want to refill or call pt in for overdue labwork since we have already done this once before and it was missed at his last appt.

## 2019-11-12 NOTE — Telephone Encounter (Signed)
New Message  Patient is returning a missed call. Patient and patient's wife unsure who it was from, but knows it was from the office. No notes in the system to show who call. If called, please give a call back at [phone].

## 2019-11-12 NOTE — Telephone Encounter (Signed)
Spoke to patient's wife (DPR on file). Patient will come in for labs in the morning. Refill for Eliquis sent in.

## 2019-11-13 ENCOUNTER — Other Ambulatory Visit: Payer: PPO

## 2019-11-13 ENCOUNTER — Other Ambulatory Visit: Payer: Self-pay

## 2019-11-13 DIAGNOSIS — E782 Mixed hyperlipidemia: Secondary | ICD-10-CM | POA: Diagnosis not present

## 2019-11-13 DIAGNOSIS — I10 Essential (primary) hypertension: Secondary | ICD-10-CM

## 2019-11-13 DIAGNOSIS — I4819 Other persistent atrial fibrillation: Secondary | ICD-10-CM | POA: Diagnosis not present

## 2019-11-13 LAB — CBC
Hematocrit: 43 % (ref 37.5–51.0)
Hemoglobin: 14 g/dL (ref 13.0–17.7)
MCH: 27 pg (ref 26.6–33.0)
MCHC: 32.6 g/dL (ref 31.5–35.7)
MCV: 83 fL (ref 79–97)
Platelets: 224 10*3/uL (ref 150–450)
RBC: 5.19 x10E6/uL (ref 4.14–5.80)
RDW: 14.4 % (ref 11.6–15.4)
WBC: 4.6 10*3/uL (ref 3.4–10.8)

## 2019-11-13 LAB — COMPREHENSIVE METABOLIC PANEL
ALT: 21 IU/L (ref 0–44)
AST: 21 IU/L (ref 0–40)
Albumin/Globulin Ratio: 1.4 (ref 1.2–2.2)
Albumin: 3.9 g/dL (ref 3.7–4.7)
Alkaline Phosphatase: 93 IU/L (ref 39–117)
BUN/Creatinine Ratio: 19 (ref 10–24)
BUN: 16 mg/dL (ref 8–27)
Bilirubin Total: 0.6 mg/dL (ref 0.0–1.2)
CO2: 23 mmol/L (ref 20–29)
Calcium: 9.9 mg/dL (ref 8.6–10.2)
Chloride: 105 mmol/L (ref 96–106)
Creatinine, Ser: 0.84 mg/dL (ref 0.76–1.27)
GFR calc Af Amer: 98 mL/min/{1.73_m2} (ref >59)
GFR calc non Af Amer: 84 mL/min/{1.73_m2} (ref >59)
Globulin, Total: 2.7 g/dL (ref 1.5–4.5)
Glucose: 81 mg/dL (ref 65–99)
Potassium: 4.6 mmol/L (ref 3.5–5.2)
Sodium: 141 mmol/L (ref 134–144)
Total Protein: 6.6 g/dL (ref 6.0–8.5)

## 2019-11-13 LAB — LIPID PANEL
Chol/HDL Ratio: 1.9 ratio (ref 0.0–5.0)
Cholesterol, Total: 136 mg/dL (ref 100–199)
HDL: 70 mg/dL (ref >39)
LDL Chol Calc (NIH): 58 mg/dL (ref 0–99)
Triglycerides: 28 mg/dL (ref 0–149)
VLDL Cholesterol Cal: 8 mg/dL (ref 5–40)

## 2019-11-13 LAB — TSH: TSH: 1.08 u[IU]/mL (ref 0.450–4.500)

## 2019-11-13 NOTE — Telephone Encounter (Signed)
Yes that's fine 

## 2019-11-13 NOTE — Telephone Encounter (Signed)
Family informed.

## 2019-12-10 ENCOUNTER — Telehealth: Payer: Self-pay | Admitting: Family Medicine

## 2019-12-10 NOTE — Telephone Encounter (Signed)
Patient is on with Ronald Robinson tomorrow at 320p

## 2019-12-10 NOTE — Telephone Encounter (Signed)
Patient would like to come in tomorrow at 3:15 for foot/leg swelling. Please advise

## 2019-12-11 ENCOUNTER — Ambulatory Visit: Payer: PPO | Admitting: Medical

## 2019-12-11 ENCOUNTER — Ambulatory Visit: Payer: PPO | Admitting: Family Medicine

## 2019-12-16 ENCOUNTER — Other Ambulatory Visit: Payer: Self-pay | Admitting: Family Medicine

## 2019-12-20 ENCOUNTER — Other Ambulatory Visit: Payer: Self-pay

## 2019-12-20 ENCOUNTER — Emergency Department (INDEPENDENT_AMBULATORY_CARE_PROVIDER_SITE_OTHER)
Admission: EM | Admit: 2019-12-20 | Discharge: 2019-12-20 | Disposition: A | Discharge status: Home / Self Care | Payer: PPO | Source: Home / Self Care

## 2019-12-20 DIAGNOSIS — R600 Localized edema: Secondary | ICD-10-CM

## 2019-12-20 DIAGNOSIS — Z8679 Personal history of other diseases of the circulatory system: Secondary | ICD-10-CM

## 2019-12-20 NOTE — ED Triage Notes (Signed)
Pt c/o ankle swelling x 3 mos. Comes and goes but lately has been lingering.

## 2019-12-20 NOTE — ED Provider Notes (Signed)
[facility]    CSN: [id number] Arrival date & time: 12/20/19  1622      History   Chief Complaint Chief Complaint  Patient presents with  . Joint Swelling    Bil Ankle swelling    HPI Ronald Robinson is a 79 y.o. male.   HPI Ronald Robinson is a 79 y.o. male presenting to UC with c/o bilateral leg swelling for 3 months. Pt denies pain. Denies skin changes. No chest pain or SOB but he does have a hx of heart disease. Last cardiology f/u was January 2021, pt notes that visit everything was normal. He did not have swelling at that time.  He tried to schedule an appointment with his PCP earlier this month but was advised it would be with a different provider than he normally sees. Pt decided not to go to that appointment.  Denies change in diet, exercise, medications or supplements.    Past Medical History:  Diagnosis Date  . CAD in native artery  11/26/2016  . Enlarged prostate   . Hyperlipidemia LDL goal <70 12/04/2016  . Hypertension     Patient Active Problem List   Diagnosis Date Noted  . Situational anxiety 01/04/2019  . Cough due to ACE inhibitor 08/01/2017  . Unstable angina ([place]) 12/04/2016  . Hyperlipidemia LDL goal <70 12/04/2016  . Coronary artery disease due to lipid rich plaque 11/26/2016  . S/P cardiac cath: Severe 3 vessel disease, not a candidate for CABG. Medical treatment 11/26/2016  . Non-ST elevation (NSTEMI) myocardial infarction ([place]) 11/24/2016  . Family history of coronary artery disease 11/24/2016  . PAF (paroxysmal atrial fibrillation) ([place]) 11/24/2016  . BPH (benign prostatic hyperplasia) 11/24/2016  . Essential hypertension 01/12/2011  . KNEE PAIN, LEFT, ACUTE 01/12/2011    Past Surgical History:  Procedure Laterality Date  . HERNIA REPAIR    . LEFT HEART CATH AND CORONARY ANGIOGRAPHY N/A 11/24/2016   Procedure: Left Heart Cath and Coronary Angiography;  Surgeon: Troy Sine, MD;  Location: [facility];  Service:  Cardiovascular;  Laterality: N/A;       Home Medications    Prior to Admission medications   Medication Sig Start Date End Date Taking? Authorizing Provider  acetaminophen (TYLENOL) 325 MG tablet Take 2 tablets (650 mg total) by mouth every 4 (four) hours as needed for headache or mild pain. 11/26/16   Delos Haring, PA-C  amitriptyline (ELAVIL) 10 MG tablet Take 1 tablet (10 mg total) by mouth at bedtime. 12/07/18   [name], DO  amLODipine (NORVASC) 10 MG tablet Take 10 mg by mouth daily.    [provider]  apixaban (ELIQUIS) 5 MG TABS tablet Take 1 tablet (5 mg total) by mouth 2 (two) times daily. 11/12/19   Jettie Booze, MD  atorvastatin (LIPITOR) 80 MG tablet TAKE 1 TABLET BY MOUTH ONCE DAILY AT  6PM 12/17/19   [name], DO  fexofenadine (ALLEGRA) 180 MG tablet Take 1 tablet (180 mg total) by mouth daily. 03/21/17   [name], DO  finasteride (PROSCAR) 5 MG tablet Take 5 mg by mouth daily.    [provider]  irbesartan (AVAPRO) 150 MG tablet Take 1/2 (one-half) tablet by mouth once daily 11/05/19   Jettie Booze, MD  isosorbide mononitrate (IMDUR) 60 MG 24 hr tablet Take 1 tablet by mouth once daily 07/16/19   Jettie Booze, MD  lidocaine (LIDODERM) 5 % Place 1 patch onto the skin daily. Remove &  Discard patch within 12 hours or as directed by MD 07/02/19   Tegeler, Gwenyth Allegra, MD  Multiple Vitamin (MULTIVITAMIN) tablet Take 1 tablet by mouth daily.    [provider]  nitroGLYCERIN (NITROSTAT) 0.4 MG SL tablet Place 1 tablet (0.4 mg total) under the tongue every 5 (five) minutes x 3 doses as needed for chest pain. 05/22/19   Jettie Booze, MD  pantoprazole (PROTONIX) 40 MG tablet Take 1 tablet by mouth once daily 09/25/19   Lyda Jester M, PA-C  tamsulosin (FLOMAX) 0.4 MG CAPS capsule Take 0.4 mg by mouth daily.     [provider]    Family History Family History  Problem  Relation Age of Onset  . Coronary artery disease Father   . Coronary artery disease Sister   . Coronary artery disease Brother   . Hypertension Mother     Social History Social History   Tobacco Use  . Smoking status: Never Smoker  . Smokeless tobacco: Never Used  Substance Use Topics  . Alcohol use: No  . Drug use: No     Allergies   Patient has no known allergies.   Review of Systems Review of Systems  Respiratory: Negative for chest tightness and shortness of breath.   Cardiovascular: Positive for leg swelling. Negative for chest pain and palpitations.  Musculoskeletal: Positive for joint swelling. Negative for arthralgias, back pain and gait problem.  Skin: Negative for color change, pallor, rash and wound.  Neurological: Negative for dizziness, light-headedness and headaches.     Physical Exam Triage Vital Signs ED Triage Vitals  Enc Vitals Group     BP 12/20/19 1637 136/75     Pulse Rate 12/20/19 1637 66     Resp 12/20/19 1637 18     Temp 12/20/19 1637 99.2 F (37.3 C)     Temp Source 12/20/19 1637 Oral     SpO2 12/20/19 1637 96 %     Weight 12/20/19 1638 198 lb (89.8 kg)     Height --      Head Circumference --      Peak Flow --      Pain Score 12/20/19 1638 0     Pain Loc --      Pain Edu? --      Excl. in [place]? --    No data found.  Updated Vital Signs BP 136/75 (BP Location: Right Arm)   Pulse 66   Temp 99.2 F (37.3 C) (Oral)   Resp 18   Wt 198 lb (89.8 kg)   SpO2 96%   BMI 27.62 kg/m   Visual Acuity Right Eye Distance:   Left Eye Distance:   Bilateral Distance:    Right Eye Near:   Left Eye Near:    Bilateral Near:     Physical Exam Vitals and nursing note reviewed.  Constitutional:      General: He is not in acute distress.    Appearance: Normal appearance. He is well-developed. He is not ill-appearing, toxic-appearing or diaphoretic.  HENT:     Head: Normocephalic and atraumatic.  Cardiovascular:     Rate and Rhythm:  Normal rate and regular rhythm.     Pulses:          Dorsalis pedis pulses are 2+ on the right side and 2+ on the left side.       Posterior tibial pulses are 2+ on the right side and 2+ on the left side.  Pulmonary:  Effort: Pulmonary effort is normal. No respiratory distress.     Breath sounds: Normal breath sounds. No stridor. No wheezing, rhonchi or rales.  Musculoskeletal:        General: Swelling present. Normal range of motion.     Cervical back: Normal range of motion.     Comments: Bilateral 2+ pitting edema to mid lower leg. Non-tender. Full ROM knees and ankles w/o pain. Normal gait.   Skin:    General: Skin is warm and dry.     Findings: No bruising, erythema or rash.  Neurological:     Mental Status: He is alert and oriented to person, place, and time.  Psychiatric:        Behavior: Behavior normal.      UC Treatments / Results  Labs (all labs ordered are listed, but only abnormal results are displayed) Labs Reviewed  CBC WITH DIFFERENTIAL/PLATELET  BRAIN NATRIURETIC PEPTIDE  COMPLETE METABOLIC PANEL WITH GFR    EKG   Radiology No results found.  Procedures Procedures (including critical care time)  Medications Ordered in UC Medications - No data to display  Initial Impression / Assessment and Plan / UC Course  I have reviewed the triage vital signs and the nursing notes.  Pertinent labs & imaging results that were available during my care of the patient were reviewed by me and considered in my medical decision making (see chart for details).     Chronic leg edema No evidence of secondary skin infection Lungs: CTAB, pt denies chest pain or SOB, low suspicion for CHF at this time, however, due cardiac hx, BNP ordered.  Blood unable to be drawn today. Offered another nurse try, pt declined. Pt states he will follow up with another office. Encouraged f/u with his PCP and cardiologist familiar with his history as he may need medication to help with  the swelling. AVS provided  Final Clinical Impressions(s) / UC Diagnoses   Final diagnoses:  Bilateral leg edema  History of coronary artery disease     Discharge Instructions      To help with your leg swelling it is recommended that you have a low sodium (salt) diet and drink mainly water throughout the day.  Elevate your legs when you have time to rest.  You may also try wearing compression socks during the day, especially when you are standing or walking for long periods of time.  It is advised that you follow up with your cardiologist and your primary care provider for ongoing care of your leg swelling. Occasional medication is needed to help with leg swelling.  If too much fluid builds up, it can get into your lungs and make it difficult to breath.   Please call 911 or have someone drive you to the hospital if you develop chest pain or trouble breathing.     ED Prescriptions    None     PDMP not reviewed this encounter.   Noe Gens, [place] 12/20/19 1952

## 2019-12-20 NOTE — ED Notes (Signed)
Missed first stick for bloodwork. Pt did not want me to retry.

## 2019-12-20 NOTE — Discharge Instructions (Signed)
  To help with your leg swelling it is recommended that you have a low sodium (salt) diet and drink mainly water throughout the day.  Elevate your legs when you have time to rest.  You may also try wearing compression socks during the day, especially when you are standing or walking for long periods of time.  It is advised that you follow up with your cardiologist and your primary care provider for ongoing care of your leg swelling. Occasional medication is needed to help with leg swelling.  If too much fluid builds up, it can get into your lungs and make it difficult to breath.   Please call 911 or have someone drive you to the hospital if you develop chest pain or trouble breathing.

## 2019-12-30 ENCOUNTER — Other Ambulatory Visit: Payer: Self-pay | Admitting: Cardiology

## 2020-01-02 DIAGNOSIS — I10 Essential (primary) hypertension: Secondary | ICD-10-CM | POA: Diagnosis not present

## 2020-01-02 DIAGNOSIS — R600 Localized edema: Secondary | ICD-10-CM | POA: Diagnosis not present

## 2020-01-08 DIAGNOSIS — R600 Localized edema: Secondary | ICD-10-CM | POA: Diagnosis not present

## 2020-01-08 DIAGNOSIS — R7989 Other specified abnormal findings of blood chemistry: Secondary | ICD-10-CM | POA: Diagnosis not present

## 2020-01-08 DIAGNOSIS — I10 Essential (primary) hypertension: Secondary | ICD-10-CM | POA: Diagnosis not present

## 2020-01-17 ENCOUNTER — Telehealth: Payer: Self-pay | Admitting: Interventional Cardiology

## 2020-01-17 NOTE — Telephone Encounter (Signed)
New Message    Pts wife is calling and would like to assist the pt to his appt because she says she wants to listen in on what the doctor is telling him. She says she always goes to all of his appointments    Please advise

## 2020-01-17 NOTE — Telephone Encounter (Signed)
I spoke to the patient's wife and informed her that we are still maintaining CoVid protocol and we can put her on a speaker phone during the visit, but are limiting the visit to the patient only.  She verbalized understanding.

## 2020-01-22 ENCOUNTER — Ambulatory Visit: Payer: PPO | Admitting: Interventional Cardiology

## 2020-01-22 NOTE — Progress Notes (Deleted)
Cardiology Office Note   Date:  01/22/2020   ID:  Ronald Robinson, DOB January 02, 1941, MRN [id number]  PCP:  [name], DO    No chief complaint on file.  CAD  Wt Readings from Last 3 Encounters:  12/20/19 198 lb (89.8 kg)  11/02/19 203 lb 9.6 oz (92.4 kg)  02/01/19 181 lb (82.1 kg)       History of Present Illness: Ronald Robinson is a 79 y.o. male   with prior h/o HTN who was recently diagnosed with CAD. He initially presented to [facility] on [facility]/23/16 with a complaint of SSCP. In the ED his EKG initially showed AF with CVR. He was unaware of palpitations or tachycardia. His Troponin was 1.34-2.13. His subsequent EKGs show subtle ST elevations and TWI. Pt was transferred to [facility]ology admission. Patient had a cardiac cath using the right radial approach which showed diffuse multivessel CAD involving the LAD, ramus intermediate, left circumflex, and RCA. EF normal at 55-60%. Pt not amenable to stent placement. CTS consulted and saw patient, they do not feel like CABG is the best treatment, his LAD system not graft able. The LCX and RCA systems are diffusely diseased and it is not possible to graft beyond the disease. Dr. Cyndia Bent recommended medical therapy. He was placed on ASA, Plavix, Imdur and Lisinopril.  Was readmitted on 2/24/18with chest pain, but this time new Afib in RVR. Amiodarone was added and Plavix was changed to apixaban and he was discharged after two days.  In early March2018, he had blurry vision. All he could see was the color brown. Discharge diagnosis was "-Bilateral and complete loss of vision is typically a symptom of low blood pressure/pre-syncope, rather than CVA." -Started on Imdur, BB, Ranexa and amiodarone.  -CTA neck showed no evidence of severe vertebrobasilar disease -MR brain negativefor acute CVA -Orthostatic as negative -Symptoms resolved, not a lot canbe added from neurology standpoint, patient already on Eliquis and  LDof aspirin.  Stopped Ranexa.In the past, he has some left sided pain that resolves with Tylenol.  In the past,Rare Exertional chest tightness if he walks fast. Not with regular activity or his job.  In 11/19, he lost his job under difficult circumstances- he has an age discrimination complaint pending. He had some anxiety. He took some meds for this. He is coping with it better now.   He tested positive for COVID in 08/2019. He tested negative in 09/2019.  He was asymptomatic.   In March 2021, BNP was elevated at 267.  He had some LE edema.  Leg elevation and compression stockings were recommended.    The patientdoes nothave symptoms concerning for COVID-19 infection (fever, chills, cough, or new shortness of breath).   Past Medical History:  Diagnosis Date  . CAD in native artery  11/26/2016  . Enlarged prostate   . Hyperlipidemia LDL goal <70 12/04/2016  . Hypertension     Past Surgical History:  Procedure Laterality Date  . HERNIA REPAIR    . LEFT HEART CATH AND CORONARY ANGIOGRAPHY N/A 11/24/2016   Procedure: Left Heart Cath and Coronary Angiography;  Surgeon: Troy Sine, MD;  Location: [facility];  Service: Cardiovascular;  Laterality: N/A;     Current Outpatient Medications  Medication Sig Dispense Refill  . acetaminophen (TYLENOL) 325 MG tablet Take 2 tablets (650 mg total) by mouth every 4 (four) hours as needed for headache or mild pain. 30 tablet 2  . amitriptyline (ELAVIL) 10 MG  tablet Take 1 tablet (10 mg total) by mouth at bedtime. 30 tablet 3  . amLODipine (NORVASC) 10 MG tablet Take 10 mg by mouth daily.    Marland Kitchen apixaban (ELIQUIS) 5 MG TABS tablet Take 1 tablet (5 mg total) by mouth 2 (two) times daily. 180 tablet 1  . atorvastatin (LIPITOR) 80 MG tablet TAKE 1 TABLET BY MOUTH ONCE DAILY AT  6PM 90 tablet 0  . fexofenadine (ALLEGRA) 180 MG tablet Take 1 tablet (180 mg total) by mouth daily. 30 tablet 0  . finasteride (PROSCAR) 5 MG  tablet Take 5 mg by mouth daily.    . irbesartan (AVAPRO) 150 MG tablet Take 1/2 (one-half) tablet by mouth once daily 45 tablet 3  . isosorbide mononitrate (IMDUR) 60 MG 24 hr tablet Take 1 tablet by mouth once daily 90 tablet 1  . lidocaine (LIDODERM) 5 % Place 1 patch onto the skin daily. Remove & Discard patch within 12 hours or as directed by MD 15 patch 0  . Multiple Vitamin (MULTIVITAMIN) tablet Take 1 tablet by mouth daily.    . nitroGLYCERIN (NITROSTAT) 0.4 MG SL tablet Place 1 tablet (0.4 mg total) under the tongue every 5 (five) minutes x 3 doses as needed for chest pain. 25 tablet 4  . pantoprazole (PROTONIX) 40 MG tablet Take 1 tablet by mouth once daily 90 tablet 1  . tamsulosin (FLOMAX) 0.4 MG CAPS capsule Take 0.4 mg by mouth daily.      No current facility-administered medications for this visit.    Allergies:   Patient has no known allergies.    Social History:  The patient  reports that he has never smoked. He has never used smokeless tobacco. He reports that he does not drink alcohol or use drugs.   Family History:  The patient's ***family history includes Coronary artery disease in his brother, father, and sister; Hypertension in his mother.    ROS:  Please see the history of present illness.   Otherwise, review of systems are positive for ***.   All other systems are reviewed and negative.    PHYSICAL EXAM: VS:  There were no vitals taken for this visit. , BMI There is no height or weight on file to calculate BMI. GEN: Well nourished, well developed, in no acute distress  HEENT: normal  Neck: no JVD, carotid bruits, or masses Cardiac: ***RRR; no murmurs, rubs, or gallops,no edema  Respiratory:  clear to auscultation bilaterally, normal work of breathing GI: soft, nontender, nondistended, + BS MS: no deformity or atrophy  Skin: warm and dry, no rash Neuro:  Strength and sensation are intact Psych: euthymic mood, full affect   EKG:   The ekg ordered today  demonstrates ***   Recent Labs: 11/13/2019: ALT 21; BUN 16; Creatinine, Ser 0.84; Hemoglobin 14.0; Platelets 224; Potassium 4.6; Sodium 141; TSH 1.080   Lipid Panel    Component Value Date/Time   CHOL 136 11/13/2019 0741   TRIG 28 11/13/2019 0741   HDL 70 11/13/2019 0741   CHOLHDL 1.9 11/13/2019 0741   CHOLHDL 3.0 11/25/2016 0217   VLDL 7 11/25/2016 0217   LDLCALC 58 11/13/2019 0741     Other studies Reviewed: Additional studies/ records that were reviewed today with results demonstrating: ***.   ASSESSMENT AND PLAN:  1. CAD/Old MI: 2. AFib: 3. Hyperlipidemia: 4. HTN: 5. LE edema:   Current medicines are reviewed at length with the patient today.  The patient concerns regarding his medicines were addressed.  The following changes have been made:  No change***  Labs/ tests ordered today include: *** No orders of the defined types were placed in this encounter.   Recommend 150 minutes/week of aerobic exercise Low fat, low carb, high fiber diet recommended  Disposition:   FU in ***   Signed, Larae Grooms, MD  01/22/2020 1:28 PM    [facility] [place], [place], [place]  [address] Phone: [phone]; Fax: [phone]

## 2020-01-23 DIAGNOSIS — R350 Frequency of micturition: Secondary | ICD-10-CM | POA: Diagnosis not present

## 2020-01-23 DIAGNOSIS — N401 Enlarged prostate with lower urinary tract symptoms: Secondary | ICD-10-CM | POA: Diagnosis not present

## 2020-01-23 DIAGNOSIS — I10 Essential (primary) hypertension: Secondary | ICD-10-CM | POA: Diagnosis not present

## 2020-01-23 DIAGNOSIS — R600 Localized edema: Secondary | ICD-10-CM | POA: Diagnosis not present

## 2020-02-01 ENCOUNTER — Other Ambulatory Visit: Payer: Self-pay

## 2020-02-01 ENCOUNTER — Encounter: Payer: Self-pay | Admitting: Interventional Cardiology

## 2020-02-01 ENCOUNTER — Ambulatory Visit (INDEPENDENT_AMBULATORY_CARE_PROVIDER_SITE_OTHER): Payer: PPO | Admitting: Interventional Cardiology

## 2020-02-01 VITALS — BP 140/60 | HR 71 | Ht 71.0 in | Wt 198.1 lb

## 2020-02-01 DIAGNOSIS — I25119 Atherosclerotic heart disease of native coronary artery with unspecified angina pectoris: Secondary | ICD-10-CM | POA: Diagnosis not present

## 2020-02-01 DIAGNOSIS — E782 Mixed hyperlipidemia: Secondary | ICD-10-CM | POA: Diagnosis not present

## 2020-02-01 DIAGNOSIS — I4819 Other persistent atrial fibrillation: Secondary | ICD-10-CM

## 2020-02-01 DIAGNOSIS — I10 Essential (primary) hypertension: Secondary | ICD-10-CM | POA: Diagnosis not present

## 2020-02-01 DIAGNOSIS — I252 Old myocardial infarction: Secondary | ICD-10-CM | POA: Diagnosis not present

## 2020-02-01 DIAGNOSIS — R600 Localized edema: Secondary | ICD-10-CM

## 2020-02-01 NOTE — Patient Instructions (Addendum)
Medication Instructions:  Your physician recommends that you continue on your current medications as directed. Please refer to the Current Medication list given to you today.  *If you need a refill on your cardiac medications before your next appointment, please call your pharmacy*   Lab Work: None ordered  If you have labs (blood work) drawn today and your tests are completely normal, you will receive your results only by: Marland Kitchen MyChart Message (if you have MyChart) OR . A paper copy in the mail If you have any lab test that is abnormal or we need to change your treatment, we will call you to review the results.   Testing/Procedures: None ordered   Follow-Up: At [facility], you and your health needs are our priority.  As part of our continuing mission to provide you with exceptional heart care, we have created designated Provider Care Teams.  These Care Teams include your primary Cardiologist (physician) and Advanced Practice Providers (APPs -  Physician Assistants and Nurse Practitioners) who all work together to provide you with the care you need, when you need it.  We recommend signing up for the patient portal called "MyChart".  Sign up information is provided on this After Visit Summary.  MyChart is used to connect with patients for Virtual Visits (Telemedicine).  Patients are able to view lab/test results, encounter notes, upcoming appointments, etc.  Non-urgent messages can be sent to your provider as well.   To learn more about what you can do with MyChart, go to [email or web address].    Your next appointment:   October 2021  The format for your next appointment:   In Person  Provider:   You may see Larae Grooms, MD or one of the following Advanced Practice Providers on your designated Care Team:    Melina Copa, PA-C  Ermalinda Barrios, PA-C    Other Instructions  Continue to wear compression stockings and elevate legs to help with swelling   Low-Sodium Eating  Plan Sodium, which is an element that makes up salt, helps you maintain a healthy balance of fluids in your body. Too much sodium can increase your blood pressure and cause fluid and waste to be held in your body. Your health care provider or dietitian may recommend following this plan if you have high blood pressure (hypertension), kidney disease, liver disease, or heart failure. Eating less sodium can help lower your blood pressure, reduce swelling, and protect your heart, liver, and kidneys. What are tips for following this plan? General guidelines  Most people on this plan should limit their sodium intake to 1,500-2,000 mg (milligrams) of sodium each day. Reading food labels   The Nutrition Facts label lists the amount of sodium in one serving of the food. If you eat more than one serving, you must multiply the listed amount of sodium by the number of servings.  Choose foods with less than 140 mg of sodium per serving.  Avoid foods with 300 mg of sodium or more per serving. Shopping  Look for lower-sodium products, often labeled as "low-sodium" or "no salt added."  Always check the sodium content even if foods are labeled as "unsalted" or "no salt added".  Buy fresh foods. ? Avoid canned foods and premade or frozen meals. ? Avoid canned, cured, or processed meats  Buy breads that have less than 80 mg of sodium per slice. Cooking  Eat more home-cooked food and less restaurant, buffet, and fast food.  Avoid adding salt when cooking. Use salt-free seasonings or  herbs instead of table salt or sea salt. Check with your health care provider or pharmacist before using salt substitutes.  Cook with plant-based oils, such as canola, sunflower, or olive oil. Meal planning  When eating at a restaurant, ask that your food be prepared with less salt or no salt, if possible.  Avoid foods that contain MSG (monosodium glutamate). MSG is sometimes added to [place] food, bouillon, and some  canned foods. What foods are recommended? The items listed may not be a complete list. Talk with your dietitian about what dietary choices are best for you. Grains Low-sodium cereals, including oats, puffed wheat and rice, and shredded wheat. Low-sodium crackers. Unsalted rice. Unsalted pasta. Low-sodium bread. Whole-grain breads and whole-grain pasta. Vegetables Fresh or frozen vegetables. "No salt added" canned vegetables. "No salt added" tomato sauce and paste. Low-sodium or reduced-sodium tomato and vegetable juice. Fruits Fresh, frozen, or canned fruit. Fruit juice. Meats and other protein foods Fresh or frozen (no salt added) meat, poultry, seafood, and fish. Low-sodium canned tuna and salmon. Unsalted nuts. Dried peas, beans, and lentils without added salt. Unsalted canned beans. Eggs. Unsalted nut butters. Dairy Milk. Soy milk. Cheese that is naturally low in sodium, such as ricotta cheese, fresh mozzarella, or Swiss cheese Low-sodium or reduced-sodium cheese. Cream cheese. Yogurt. Fats and oils Unsalted butter. Unsalted margarine with no trans fat. Vegetable oils such as canola or olive oils. Seasonings and other foods Fresh and dried herbs and spices. Salt-free seasonings. Low-sodium mustard and ketchup. Sodium-free salad dressing. Sodium-free light mayonnaise. Fresh or refrigerated horseradish. Lemon juice. Vinegar. Homemade, reduced-sodium, or low-sodium soups. Unsalted popcorn and pretzels. Low-salt or salt-free chips. What foods are not recommended? The items listed may not be a complete list. Talk with your dietitian about what dietary choices are best for you. Grains Instant hot cereals. Bread stuffing, pancake, and biscuit mixes. Croutons. Seasoned rice or pasta mixes. Noodle soup cups. Boxed or frozen macaroni and cheese. Regular salted crackers. Self-rising flour. Vegetables Sauerkraut, pickled vegetables, and relishes. Olives. [place] fries. Onion rings. Regular canned  vegetables (not low-sodium or reduced-sodium). Regular canned tomato sauce and paste (not low-sodium or reduced-sodium). Regular tomato and vegetable juice (not low-sodium or reduced-sodium). Frozen vegetables in sauces. Meats and other protein foods Meat or fish that is salted, canned, smoked, spiced, or pickled. Bacon, ham, sausage, hotdogs, corned beef, chipped beef, packaged lunch meats, salt pork, jerky, pickled herring, anchovies, regular canned tuna, sardines, salted nuts. Dairy Processed cheese and cheese spreads. Cheese curds. Blue cheese. Feta cheese. String cheese. Regular cottage cheese. Buttermilk. Canned milk. Fats and oils Salted butter. Regular margarine. Ghee. Bacon fat. Seasonings and other foods Onion salt, garlic salt, seasoned salt, table salt, and sea salt. Canned and packaged gravies. Worcestershire sauce. Tartar sauce. Barbecue sauce. Teriyaki sauce. Soy sauce, including reduced-sodium. Steak sauce. Fish sauce. Oyster sauce. Cocktail sauce. Horseradish that you find on the shelf. Regular ketchup and mustard. Meat flavorings and tenderizers. Bouillon cubes. Hot sauce and Tabasco sauce. Premade or packaged marinades. Premade or packaged taco seasonings. Relishes. Regular salad dressings. Salsa. Potato and tortilla chips. Corn chips and puffs. Salted popcorn and pretzels. Canned or dried soups. Pizza. Frozen entrees and pot pies. Summary  Eating less sodium can help lower your blood pressure, reduce swelling, and protect your heart, liver, and kidneys.  Most people on this plan should limit their sodium intake to 1,500-2,000 mg (milligrams) of sodium each day.  Canned, boxed, and frozen foods are high in sodium. Restaurant foods, fast foods,  and pizza are also very high in sodium. You also get sodium by adding salt to food.  Try to cook at home, eat more fresh fruits and vegetables, and eat less fast food, canned, processed, or prepared foods. This information is not intended  to replace advice given to you by your health care provider. Make sure you discuss any questions you have with your health care provider. Document Revised: 09/09/2017 Document Reviewed: 09/20/2016 Elsevier Patient Education  2020 [employer].

## 2020-02-01 NOTE — Progress Notes (Signed)
Cardiology Office Note   Date:  02/01/2020   ID:  Ronald Robinson, DOB 11/08/1940, MRN [id number]  PCP:  No primary care provider on file.    No chief complaint on file.  CAD  Wt Readings from Last 3 Encounters:  02/01/20 198 lb 1.9 oz (89.9 kg)  12/20/19 198 lb (89.8 kg)  11/02/19 203 lb 9.6 oz (92.4 kg)       History of Present Illness: Ronald Robinson is a 79 y.o. male  with prior h/o HTN who was recently diagnosed with CAD. He initially presented to [facility] on 11/23/16 with a complaint of SSCP. In the ED his EKG initially showed AF with CVR. He was unaware of palpitations or tachycardia. His Troponin was 1.34-2.13. His subsequent EKGs show subtle ST elevations and TWI. Pt was transferred to [facility] for cardiology admission. Patient had a cardiac cath using the right radial approach which showed diffuse multivessel CAD involving the LAD, ramus intermediate, left circumflex, and RCA. EF normal at 55-60%. Pt not amenable to stent placement. CTS consulted and saw patient, they do not feel like CABG is the best treatment, his LAD system not graft able. The LCX and RCA systems are diffusely diseased and it is not possible to graft beyond the disease. Dr. Cyndia Bent recommended medical therapy. He was placed on ASA, Plavix, Imdur and Lisinopril.  Was readmitted on 2/24/18with chest pain, but this time new Afib in RVR. Amiodarone was added and Plavix was changed to apixaban and he was discharged after two days.  In early March2018, he had blurry vision. All he could see was the color brown. Discharge diagnosis was "-Bilateral and complete loss of vision is typically a symptom of low blood pressure/pre-syncope, rather than CVA." -Started on Imdur, BB, Ranexa and amiodarone.  -CTA neck showed no evidence of severe vertebrobasilar disease -MR brain negativefor acute CVA -Orthostatic as negative -Symptoms resolved, not a lot canbe added from neurology standpoint, patient already on  Eliquis and LDof aspirin.  Stopped Ranexa.In the past, he has some left sided pain that resolves with Tylenol.  In the past,Rare Exertional chest tightness if he walks fast. Not with regular activity or his job.  The patientdoes nothave symptoms concerning for COVID-19 infection (fever, chills, cough, or new shortness of breath).   In 11/19, he lost his job under difficult circumstances- he has an age discrimination complaint pending. He had some anxiety. He took some meds for this. He is coping with it better now.   He tested positive for COVID in 08/2019. He tested negative in 09/2019.  He was asymptomatic.   Since the last visit, there was some edema and cardiomegaly noted.   Wife noticed that he ws having some swelling in his ankle about 2 months ago.  He started wearing some compression stocking sand this helped greatly.    He has noted that he is eating too much.  He has gained some weight over the course of 2020.   Past Medical History:  Diagnosis Date  . CAD in native artery  11/26/2016  . Enlarged prostate   . Hyperlipidemia LDL goal <70 12/04/2016  . Hypertension     Past Surgical History:  Procedure Laterality Date  . HERNIA REPAIR    . LEFT HEART CATH AND CORONARY ANGIOGRAPHY N/A 11/24/2016   Procedure: Left Heart Cath and Coronary Angiography;  Surgeon: Troy Sine, MD;  Location: [facility];  Service: Cardiovascular;  Laterality: N/A;     Current  Outpatient Medications  Medication Sig Dispense Refill  . acetaminophen (TYLENOL) 325 MG tablet Take 2 tablets (650 mg total) by mouth every 4 (four) hours as needed for headache or mild pain. 30 tablet 2  . amitriptyline (ELAVIL) 10 MG tablet Take 1 tablet (10 mg total) by mouth at bedtime. 30 tablet 3  . amLODipine (NORVASC) 10 MG tablet Take 10 mg by mouth daily.    Marland Kitchen apixaban (ELIQUIS) 5 MG TABS tablet Take 1 tablet (5 mg total) by mouth 2 (two) times daily. 180 tablet 1  . atorvastatin  (LIPITOR) 80 MG tablet TAKE 1 TABLET BY MOUTH ONCE DAILY AT  6PM 90 tablet 0  . fexofenadine (ALLEGRA) 180 MG tablet Take 1 tablet (180 mg total) by mouth daily. 30 tablet 0  . finasteride (PROSCAR) 5 MG tablet Take 5 mg by mouth daily.    . irbesartan (AVAPRO) 150 MG tablet Take 1/2 (one-half) tablet by mouth once daily 45 tablet 3  . isosorbide mononitrate (IMDUR) 60 MG 24 hr tablet Take 1 tablet by mouth once daily 90 tablet 1  . lidocaine (LIDODERM) 5 % Place 1 patch onto the skin daily. Remove & Discard patch within 12 hours or as directed by MD 15 patch 0  . Multiple Vitamin (MULTIVITAMIN) tablet Take 1 tablet by mouth daily.    . nitroGLYCERIN (NITROSTAT) 0.4 MG SL tablet Place 1 tablet (0.4 mg total) under the tongue every 5 (five) minutes x 3 doses as needed for chest pain. 25 tablet 4  . pantoprazole (PROTONIX) 40 MG tablet Take 1 tablet by mouth once daily 90 tablet 1  . tamsulosin (FLOMAX) 0.4 MG CAPS capsule Take 0.4 mg by mouth daily.      No current facility-administered medications for this visit.    Allergies:   Patient has no known allergies.    Social History:  The patient  reports that he has never smoked. He has never used smokeless tobacco. He reports that he does not drink alcohol or use drugs.   Family History:  The patient's family history includes Coronary artery disease in his brother, father, and sister; Hypertension in his mother.    ROS:  Please see the history of present illness.   Otherwise, review of systems are positive for LE edema.   All other systems are reviewed and negative.    PHYSICAL EXAM: VS:  BP 140/60   Pulse 71   Ht 5\' 11"  (1.803 m)   Wt 198 lb 1.9 oz (89.9 kg)   SpO2 98%   BMI 27.63 kg/m  , BMI Body mass index is 27.63 kg/m. GEN: Well nourished, well developed, in no acute distress  HEENT: normal  Neck: no JVD, carotid bruits, or masses Cardiac: RRR; no murmurs, rubs, or gallops,no edema  Respiratory:  clear to auscultation  bilaterally, normal work of breathing GI: soft, nontender, nondistended, + BS MS: no deformity or atrophy  Skin: warm and dry, no rash Neuro:  Strength and sensation are intact Psych: euthymic mood, full affect    Recent Labs: 11/13/2019: ALT 21; BUN 16; Creatinine, Ser 0.84; Hemoglobin 14.0; Platelets 224; Potassium 4.6; Sodium 141; TSH 1.080   Lipid Panel    Component Value Date/Time   CHOL 136 11/13/2019 0741   TRIG 28 11/13/2019 0741   HDL 70 11/13/2019 0741   CHOLHDL 1.9 11/13/2019 0741   CHOLHDL 3.0 11/25/2016 0217   VLDL 7 11/25/2016 0217   LDLCALC 58 11/13/2019 0741     Other studies  Reviewed: Additional studies/ records that were reviewed today with results demonstrating: 3/21 CXR showed: Mild cardiomegaly. Streaky bibasilar opacities most likely representing atelectasis..  .   ASSESSMENT AND PLAN:    1. CAD: No angina on medical therapy.  2. HTN: The current medical regimen is effective;  continue present plan and medications. 3. AFib: Eliquis for stroke prevention.  No bleeding issues.  4. Cardiomegaly on XRay: Not in CHF at this time. LE edema is from venous insufficiency.  Continue compression stockings and leg elevation.  If he had sx of CHF, we could do echo to get true size of the heart.  If he has persistent swelling that does not respond to leg elevation and compression, could prescribe Lasix, but he does not want a medicine now.    Current medicines are reviewed at length with the patient today.  The patient concerns regarding his medicines were addressed.  The following changes have been made:  No change  Labs/ tests ordered today include:  No orders of the defined types were placed in this encounter.   Recommend 150 minutes/week of aerobic exercise Low fat, low carb, high fiber diet recommended  Disposition:   FU in 6 months   Signed, Larae Grooms, MD  02/01/2020 4:26 PM    [facility], [place],  [place]  [address] Phone: [phone]; Fax: [phone]

## 2020-02-10 ENCOUNTER — Other Ambulatory Visit: Payer: Self-pay | Admitting: Interventional Cardiology

## 2020-02-19 ENCOUNTER — Telehealth: Payer: Self-pay | Admitting: Interventional Cardiology

## 2020-02-19 DIAGNOSIS — R600 Localized edema: Secondary | ICD-10-CM

## 2020-02-19 NOTE — Telephone Encounter (Signed)
Called and spoke to the patient's wife (DPR on file). She states that the patient has continued to have swelling in his lower extremities. She states that the patient is not having any SOB, weight gain, or any other Sx. Denies eating foods that are high in salt content. She states that that the patient has been elevating his legs and wearing compression stockings but continues to have swelling. She is asking if the patient can be started on a diuretic to help with swelling. Made her aware that I will forward to Dr. Irish Lack for recommendation.

## 2020-02-19 NOTE — Telephone Encounter (Signed)
Pt c/o swelling: STAT is pt has developed SOB within 24 hours  1) How much weight have you gained and in what time span? Has not gained weight  2) If swelling, where is the swelling located? Ankles and legs  3) Are you currently taking a fluid pill? no  4) Are you currently SOB? no  5) Do you have a log of your daily weights (if so, list)? no  6) Have you gained 3 pounds in a day or 5 pounds in a week? no  7) Have you traveled recently? No  Patient's wife calling stating the patient is swelling in his ankles and legs and is requesting to start a fluid pill. She states he is not having any other symptoms.

## 2020-03-02 NOTE — Telephone Encounter (Signed)
OK to call in Furosemide 40 mg daily prn with KCl 20 mEq on days when he uses the Lasix.  Would take it for 3 days and can back of on frequency if swelling improves.  Would check BMet next week.  JV

## 2020-03-03 MED ORDER — FUROSEMIDE 40 MG PO TABS: 40.0000 mg | ORAL_TABLET | Freq: Every day | ORAL | 0 refills | Status: AC | PRN

## 2020-03-03 MED ORDER — POTASSIUM CHLORIDE CRYS ER 20 MEQ PO TBCR: 20.0000 meq | EXTENDED_RELEASE_TABLET | Freq: Every day | ORAL | 0 refills | Status: AC | PRN

## 2020-03-03 NOTE — Telephone Encounter (Signed)
Called and spoke to patient's wife and made her aware of recommendations to start lasix 40 mg QD PRN and k-dur 20 mEq QD PRN. Instructed for the patient to take both the lasix and the k-dur daily for 3 days and then can decrease to PRN. Patient will come in on 6/4 for BMET. Wife verbalized understanding and thanked me for the call.

## 2020-03-13 DIAGNOSIS — N400 Enlarged prostate without lower urinary tract symptoms: Secondary | ICD-10-CM | POA: Diagnosis not present

## 2020-03-13 DIAGNOSIS — R350 Frequency of micturition: Secondary | ICD-10-CM | POA: Diagnosis not present

## 2020-03-14 ENCOUNTER — Other Ambulatory Visit: Payer: PPO | Admitting: *Deleted

## 2020-03-14 ENCOUNTER — Other Ambulatory Visit: Payer: Self-pay

## 2020-03-14 DIAGNOSIS — R600 Localized edema: Secondary | ICD-10-CM

## 2020-03-15 LAB — BASIC METABOLIC PANEL
BUN/Creatinine Ratio: 21 (ref 10–24)
BUN: 22 mg/dL (ref 8–27)
CO2: 23 mmol/L (ref 20–29)
Calcium: 9.5 mg/dL (ref 8.6–10.2)
Chloride: 106 mmol/L (ref 96–106)
Creatinine, Ser: 1.03 mg/dL (ref 0.76–1.27)
GFR calc Af Amer: 81 mL/min/{1.73_m2} (ref >59)
GFR calc non Af Amer: 70 mL/min/{1.73_m2} (ref >59)
Glucose: 108 mg/dL — ABNORMAL HIGH (ref 65–99)
Potassium: 4.2 mmol/L (ref 3.5–5.2)
Sodium: 142 mmol/L (ref 134–144)

## 2020-03-26 ENCOUNTER — Other Ambulatory Visit: Payer: Self-pay | Admitting: Family Medicine

## 2020-05-06 ENCOUNTER — Other Ambulatory Visit: Payer: Self-pay

## 2020-05-30 ENCOUNTER — Encounter (HOSPITAL_BASED_OUTPATIENT_CLINIC_OR_DEPARTMENT_OTHER): Payer: Self-pay | Admitting: *Deleted

## 2020-05-30 ENCOUNTER — Other Ambulatory Visit: Payer: Self-pay

## 2020-05-30 ENCOUNTER — Emergency Department (HOSPITAL_BASED_OUTPATIENT_CLINIC_OR_DEPARTMENT_OTHER)
Admission: EM | Admit: 2020-05-30 | Discharge: 2020-05-30 | Disposition: A | Discharge status: Home / Self Care | Payer: PPO | Attending: Emergency Medicine | Admitting: Emergency Medicine

## 2020-05-30 DIAGNOSIS — R55 Syncope and collapse: Secondary | ICD-10-CM | POA: Insufficient documentation

## 2020-05-30 DIAGNOSIS — Z20822 Contact with and (suspected) exposure to covid-19: Secondary | ICD-10-CM | POA: Insufficient documentation

## 2020-05-30 DIAGNOSIS — Z79899 Other long term (current) drug therapy: Secondary | ICD-10-CM | POA: Insufficient documentation

## 2020-05-30 DIAGNOSIS — Z7901 Long term (current) use of anticoagulants: Secondary | ICD-10-CM | POA: Insufficient documentation

## 2020-05-30 DIAGNOSIS — I10 Essential (primary) hypertension: Secondary | ICD-10-CM | POA: Diagnosis not present

## 2020-05-30 DIAGNOSIS — I2510 Atherosclerotic heart disease of native coronary artery without angina pectoris: Secondary | ICD-10-CM | POA: Insufficient documentation

## 2020-05-30 LAB — CBC WITH DIFFERENTIAL/PLATELET
Abs Immature Granulocytes: 0.02 10*3/uL (ref 0.00–0.07)
Basophils Absolute: 0 10*3/uL (ref 0.0–0.1)
Basophils Relative: 1 %
Eosinophils Absolute: 0.1 10*3/uL (ref 0.0–0.5)
Eosinophils Relative: 2 %
HCT: 43.8 % (ref 39.0–52.0)
Hemoglobin: 13.8 g/dL (ref 13.0–17.0)
Immature Granulocytes: 0 %
Lymphocytes Relative: 21 %
Lymphs Abs: 1.1 10*3/uL (ref 0.7–4.0)
MCH: 26.5 pg (ref 26.0–34.0)
MCHC: 31.5 g/dL (ref 30.0–36.0)
MCV: 84.1 fL (ref 80.0–100.0)
Monocytes Absolute: 0.6 10*3/uL (ref 0.1–1.0)
Monocytes Relative: 11 %
Neutro Abs: 3.4 10*3/uL (ref 1.7–7.7)
Neutrophils Relative %: 65 %
Platelets: 196 10*3/uL (ref 150–400)
RBC: 5.21 MIL/uL (ref 4.22–5.81)
RDW: 16 % — ABNORMAL HIGH (ref 11.5–15.5)
WBC: 5.3 10*3/uL (ref 4.0–10.5)
nRBC: 0 % (ref 0.0–0.2)

## 2020-05-30 LAB — BASIC METABOLIC PANEL
Anion gap: 12 (ref 5–15)
BUN: 22 mg/dL (ref 8–23)
CO2: 23 mmol/L (ref 22–32)
Calcium: 9.1 mg/dL (ref 8.9–10.3)
Chloride: 102 mmol/L (ref 98–111)
Creatinine, Ser: 0.97 mg/dL (ref 0.61–1.24)
GFR calc Af Amer: >60 mL/min (ref >60)
GFR calc non Af Amer: >60 mL/min (ref >60)
Glucose, Bld: 118 mg/dL — ABNORMAL HIGH (ref 70–99)
Potassium: 3.7 mmol/L (ref 3.5–5.1)
Sodium: 137 mmol/L (ref 135–145)

## 2020-05-30 LAB — SARS CORONAVIRUS 2 BY RT PCR (HOSPITAL ORDER, PERFORMED IN ~~LOC~~ HOSPITAL LAB): SARS Coronavirus 2: NEGATIVE

## 2020-05-30 LAB — TROPONIN I (HIGH SENSITIVITY)
Troponin I (High Sensitivity): <2 ng/L (ref <18)
Troponin I (High Sensitivity): <2 ng/L (ref <18)

## 2020-05-30 MED ORDER — SODIUM CHLORIDE 0.9 % IV SOLN: INTRAVENOUS | Status: DC

## 2020-05-30 NOTE — Discharge Instructions (Addendum)
Please schedule follow-up appointment with primary doctor as well as your cardiologist regarding the episode today.  If you have any additional episodes of lightheadedness, any episodes of passing out, chest pain or difficulty breathing, please return immediately to the emergency room for reassessment.

## 2020-05-30 NOTE — ED Provider Notes (Signed)
[facility]PART[facility] Provider Note   CSN: [id number] Arrival date & time: 05/30/20  1254     History Chief Complaint  Patient presents with  . Hypotension  . Dizziness    Rex Oesterle is a 79 y.o. male.  HPI   79 year old male with near syncope.  Symptom onset around 1030 to 11 AM today.  He was work when symptoms began.  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  Vision became slightly blurred he felt very hot.  Denies any pain.  Symptoms lasted approximately 10 to 15 minutes.  Noted to be mildly hypotensive.  Symptoms have since resolved with no recurrence.  He felt like he was in his usual state of health when he got up this morning.  Past Medical History:  Diagnosis Date  . CAD in native artery  11/26/2016  . Enlarged prostate   . Hyperlipidemia LDL goal <70 12/04/2016  . Hypertension     Patient Active Problem List   Diagnosis Date Noted  . Situational anxiety 01/04/2019  . Cough due to ACE inhibitor 08/01/2017  . Unstable angina ([place]) 12/04/2016  . Hyperlipidemia LDL goal <70 12/04/2016  . Coronary artery disease due to lipid rich plaque 11/26/2016  . S/P cardiac cath: Severe 3 vessel disease, not a candidate for CABG. Medical treatment 11/26/2016  . Non-ST elevation (NSTEMI) myocardial infarction ([place]) 11/24/2016  . Family history of coronary artery disease 11/24/2016  . PAF (paroxysmal atrial fibrillation) ([place]) 11/24/2016  . BPH (benign prostatic hyperplasia) 11/24/2016  . Essential hypertension 01/12/2011  . KNEE PAIN, LEFT, ACUTE 01/12/2011    Past Surgical History:  Procedure Laterality Date  . HERNIA REPAIR    . LEFT HEART CATH AND CORONARY ANGIOGRAPHY N/A 11/24/2016   Procedure: Left Heart Cath and Coronary Angiography;  Surgeon: Troy Sine, MD;  Location: [facility];  Service: Cardiovascular;  Laterality: N/A;       Family History  Problem Relation Age of Onset  .  Coronary artery disease Father   . Coronary artery disease Sister   . Coronary artery disease Brother   . Hypertension Mother     Social History   Tobacco Use  . Smoking status: Never Smoker  . Smokeless tobacco: Never Used  Vaping Use  . Vaping Use: Never used  Substance Use Topics  . Alcohol use: No  . Drug use: No    Home Medications Prior to Admission medications   Medication Sig Start Date End Date Taking? Authorizing Provider  acetaminophen (TYLENOL) 325 MG tablet Take 2 tablets (650 mg total) by mouth every 4 (four) hours as needed for headache or mild pain. 11/26/16   Delos Haring, PA-C  amitriptyline (ELAVIL) 10 MG tablet Take 1 tablet (10 mg total) by mouth at bedtime. 12/07/18   [name], DO  amLODipine (NORVASC) 10 MG tablet Take 10 mg by mouth daily.    [provider]  apixaban (ELIQUIS) 5 MG TABS tablet Take 1 tablet (5 mg total) by mouth 2 (two) times daily. 11/12/19   Jettie Booze, MD  atorvastatin (LIPITOR) 80 MG tablet TAKE 1 TABLET BY MOUTH ONCE DAILY AT  6PM 03/27/20   [name], DO  fexofenadine (ALLEGRA) 180 MG tablet Take 1 tablet (180 mg total) by mouth daily. 03/21/17   [name], DO  finasteride (PROSCAR) 5 MG tablet Take 5 mg by mouth daily.    [provider]  furosemide (LASIX) 40 MG tablet Take 1 tablet (40 mg total) by mouth daily as needed (swelling). 03/03/20   Jettie Booze, MD  irbesartan (AVAPRO) 150 MG tablet Take 1/2 (one-half) tablet by mouth once daily 11/05/19   Jettie Booze, MD  isosorbide mononitrate (IMDUR) 60 MG 24 hr tablet Take 1 tablet by mouth once daily 02/12/20   Jettie Booze, MD  lidocaine (LIDODERM) 5 % Place 1 patch onto the skin daily. Remove & Discard patch within 12 hours or as directed by MD 07/02/19   Tegeler, Gwenyth Allegra, MD  Multiple Vitamin (MULTIVITAMIN) tablet Take 1 tablet by mouth daily.    [provider]  nitroGLYCERIN  (NITROSTAT) 0.4 MG SL tablet Place 1 tablet (0.4 mg total) under the tongue every 5 (five) minutes x 3 doses as needed for chest pain. 05/22/19   Jettie Booze, MD  pantoprazole (PROTONIX) 40 MG tablet Take 1 tablet by mouth once daily 01/01/20   Lyda Jester M, PA-C  potassium chloride SA (KLOR-CON) 20 MEQ tablet Take 1 tablet (20 mEq total) by mouth daily as needed (Take with furosemide (lasix) only). 03/03/20   Jettie Booze, MD  tamsulosin (FLOMAX) 0.4 MG CAPS capsule Take 0.4 mg by mouth daily.     [provider]    Allergies    Patient has no known allergies.  Review of Systems   Review of Systems All systems reviewed and negative, other than as noted in HPI.  Physical Exam Updated Vital Signs BP 95/67   Pulse 70   Temp 98.4 F (36.9 C) (Oral)   Resp 14   Ht 5\' 9"  (1.753 m)   Wt 88.1 kg   SpO2 95%   BMI 28.68 kg/m   Physical Exam Vitals and nursing note reviewed.  Constitutional:      General: He is not in acute distress.    Appearance: He is well-developed.  HENT:     Head: Normocephalic and atraumatic.  Eyes:     General:        Right eye: No discharge.        Left eye: No discharge.     Conjunctiva/sclera: Conjunctivae normal.  Cardiovascular:     Rate and Rhythm: Normal rate and regular rhythm.     Heart sounds: Normal heart sounds. No murmur heard.  No friction rub. No gallop.   Pulmonary:     Effort: Pulmonary effort is normal. No respiratory distress.     Breath sounds: Normal breath sounds.  Abdominal:     General: There is no distension.     Palpations: Abdomen is soft.     Tenderness: There is no abdominal tenderness.  Musculoskeletal:        General: No tenderness.     Cervical back: Neck supple.     Comments: Mild symmetric LE edema. No calf tenderness. Neg Homan's.   Skin:    General: Skin is warm and dry.  Neurological:     Mental Status: He is alert.  Psychiatric:        Behavior: Behavior normal.         Thought Content: Thought content normal.     ED Results / Procedures / Treatments   Labs (all labs ordered are listed, but only abnormal results are displayed) Labs Reviewed  CBC WITH DIFFERENTIAL/PLATELET - Abnormal; Notable for the following components:      Result Value   RDW 16.0 (*)    All other components within normal  limits  BASIC METABOLIC PANEL - Abnormal; Notable for the following components:   Glucose, Bld 118 (*)    All other components within normal limits  SARS CORONAVIRUS 2 BY RT PCR (HOSPITAL ORDER, [place] [facility])  TROPONIN I (HIGH SENSITIVITY)  TROPONIN I (HIGH SENSITIVITY)    EKG EKG Interpretation  Date/Time:  Friday May 30 2020 13:24:17 EDT Ventricular Rate:  72 PR Interval:    QRS Duration: 87 QT Interval:  394 QTC Calculation: 432 R Axis:   2 Text Interpretation: Sinus rhythm Ventricular premature complex Borderline short PR interval Non-specific ST-t changes Confirmed by Virgel Manifold [phone]) on 05/30/2020 1:39:15 PM   Radiology No results found.  Procedures Procedures (including critical care time)  Medications Ordered in ED Medications  0.9 %  sodium chloride infusion ( Intravenous New Bag/Given 05/30/20 1421)    ED Course  I have reviewed the triage vital signs and the nursing notes.  Pertinent labs & imaging results that were available during my care of the patient were reviewed by me and considered in my medical decision making (see chart for details).    MDM Rules/Calculators/A&P                          79 year old male with near syncopal symptoms prior to arrival, now resolved.  Mild hypotension which resolved prior to getting fluids.  His EKG is abnormal and changed from prior.  He has biphasic T waves in V2 through 4.  He denies any chest pain though.  He had a cardiac catheterization in 2018 which showed multivessel CAD.  He was not a candidate for PCI nor for CABG and medical management was  advised.  His first troponin was normal.  With symptoms beginning a little bit before arrival, will repeat. Symptoms seem very atypical for ACS and I wouldn't have ordered it if EKG wasn't abnormal. If second troponin remains normal, I think he is appropriate for discharge and outpt FU.    Final Clinical Impression(s) / ED Diagnoses Final diagnoses:  Near syncope    Rx / DC Orders ED Discharge Orders    None       Virgel Manifold, MD 06/03/20 1112

## 2020-05-30 NOTE — ED Triage Notes (Signed)
Low BP and fatigue this am.

## 2020-05-30 NOTE — ED Provider Notes (Signed)
Signout note  79 year old gentleman presented to ER with concern for low-grade fever, fatigue.  Soft, improved without fluids.  Patient currently asymptomatic, had no associated chest pain.  EKG with nonspecific T wave changes in anterior leads.  Troponin initially was negative.  Plan to repeat troponin, patient remains asymptomatic and second troponin is negative, dissipate discharge home and follow-up with his primary doctors.  3:30 PM received sign out from [place] - f/u trop, then likely dc  4:56 PM Reviewed repeat troponin which is undetectable, patient remains asymptomatic with stable vital signs, will discharge home.   Lucrezia Starch, MD 05/30/20 [phone]

## 2020-06-03 DIAGNOSIS — I959 Hypotension, unspecified: Secondary | ICD-10-CM | POA: Diagnosis not present

## 2020-06-03 DIAGNOSIS — L608 Other nail disorders: Secondary | ICD-10-CM | POA: Diagnosis not present

## 2020-06-03 DIAGNOSIS — G609 Hereditary and idiopathic neuropathy, unspecified: Secondary | ICD-10-CM | POA: Diagnosis not present

## 2020-06-03 DIAGNOSIS — R109 Unspecified abdominal pain: Secondary | ICD-10-CM | POA: Diagnosis not present

## 2020-06-03 DIAGNOSIS — B372 Candidiasis of skin and nail: Secondary | ICD-10-CM | POA: Diagnosis not present

## 2020-06-08 ENCOUNTER — Other Ambulatory Visit: Payer: Self-pay | Admitting: Interventional Cardiology

## 2020-06-09 NOTE — Telephone Encounter (Signed)
Eliquis 5mg  refill request received. Patient is 79 years old, weight-88.1kg, Crea-0.97 on 05/30/2020, Diagnosis-Afib, and last seen by Dr. Irish Lack on 02/01/2020. Dose is appropriate based on dosing criteria. Will send in refill to requested pharmacy.

## 2020-07-10 DIAGNOSIS — M7061 Trochanteric bursitis, right hip: Secondary | ICD-10-CM | POA: Diagnosis not present

## 2020-07-13 ENCOUNTER — Other Ambulatory Visit: Payer: Self-pay | Admitting: Cardiology

## 2020-07-14 DIAGNOSIS — M2042 Other hammer toe(s) (acquired), left foot: Secondary | ICD-10-CM | POA: Diagnosis not present

## 2020-07-14 DIAGNOSIS — M7741 Metatarsalgia, right foot: Secondary | ICD-10-CM | POA: Diagnosis not present

## 2020-07-14 DIAGNOSIS — G629 Polyneuropathy, unspecified: Secondary | ICD-10-CM | POA: Diagnosis not present

## 2020-07-14 DIAGNOSIS — L909 Atrophic disorder of skin, unspecified: Secondary | ICD-10-CM | POA: Diagnosis not present

## 2020-07-14 DIAGNOSIS — B351 Tinea unguium: Secondary | ICD-10-CM | POA: Diagnosis not present

## 2020-07-14 DIAGNOSIS — M7742 Metatarsalgia, left foot: Secondary | ICD-10-CM | POA: Diagnosis not present

## 2020-07-14 DIAGNOSIS — M2041 Other hammer toe(s) (acquired), right foot: Secondary | ICD-10-CM | POA: Diagnosis not present

## 2020-07-14 DIAGNOSIS — L851 Acquired keratosis [keratoderma] palmaris et plantaris: Secondary | ICD-10-CM | POA: Diagnosis not present

## 2020-07-15 ENCOUNTER — Other Ambulatory Visit: Payer: Self-pay | Admitting: Interventional Cardiology

## 2020-07-23 ENCOUNTER — Other Ambulatory Visit: Payer: Self-pay | Admitting: Family Medicine

## 2020-08-03 NOTE — Progress Notes (Signed)
Cardiology Office Note   Date:  08/04/2020   ID:  Ronald Robinson, DOB Mar 10, 1941, MRN [id number]  PCP:  Pcp, No    No chief complaint on file.  CAD  Wt Readings from Last 3 Encounters:  08/04/20 199 lb 3.2 oz (90.4 kg)  05/30/20 194 lb 3.2 oz (88.1 kg)  02/01/20 198 lb 1.9 oz (89.9 kg)       History of Present Illness: Ronald Robinson is a 79 y.o. male  with prior h/o HTN who was recently diagnosed with CAD. He initially presented to [facility] on [facility]16 with a complaint of SSCP. In the ED his EKG initially showed AF with CVR. He was unaware of palpitations or tachycardia. His Troponin was 1.34-2.13. His subsequent EKGs show subtle ST elevations and TWI. Pt was transferred to [facility] for cardiology admission. Patient had a cardiac cath using the right radial approach which showed diffuse multivessel CAD involving the LAD, ramus intermediate, left circumflex, and RCA. EF normal at 55-60%. Pt not amenable to stent placement. CTS consulted and saw patient, they do not feel like CABG is the best treatment, his LAD system not graft able. The LCX and RCA systems are diffusely diseased and it is not possible to graft beyond the disease. Dr. Cyndia Bent recommended medical therapy. He was placed on ASA, Plavix, Imdur and Lisinopril.  Was readmitted on 2/24/18with chest pain, but this time new Afib in RVR. Amiodarone was added and Plavix was changed to apixaban and he was discharged after two days.  In early March2018, he had blurry vision. All he could see was the color brown. Discharge diagnosis was "-Bilateral and complete loss of vision is typically a symptom of low blood pressure/pre-syncope, rather than CVA." -Started on Imdur, BB, Ranexa and amiodarone.  -CTA neck showed no evidence of severe vertebrobasilar disease -MR brain negativefor acute CVA -Orthostatic as negative -Symptoms resolved, not a lot canbe added from neurology standpoint, patient already on Eliquis and LDof  aspirin.  Stopped Ranexa.In the past, he has some left sided pain that resolves with Tylenol.  In the past,Rare Exertional chest tightness if he walks fast. Not with regular activity or his job.  The patientdoes nothave symptoms concerning for COVID-19 infection (fever, chills, cough, or new shortness of breath).   In 11/19, he lost his job under difficult circumstances- he has an age discrimination complaint pending. He had some anxiety. He took some meds for this. He is coping with it better now.   He tested positive for COVID in 08/2019. He tested negative in 09/2019.  He was asymptomatic.   He did get a vaccine.  Two shots Pfizer.  He got a new job after a dispute with his former employer.    He works with [employer].  Episode of dizziness while at work in 8/21.  He went to the ER and his BP was fluctuating.  Resolved with IV fluids. Troponins negative.       Past Medical History:  Diagnosis Date  . CAD in native artery  11/26/2016  . Enlarged prostate   . Hyperlipidemia LDL goal <70 12/04/2016  . Hypertension     Past Surgical History:  Procedure Laterality Date  . HERNIA REPAIR    . LEFT HEART CATH AND CORONARY ANGIOGRAPHY N/A 11/24/2016   Procedure: Left Heart Cath and Coronary Angiography;  Surgeon: Troy Sine, MD;  Location: [facility];  Service: Cardiovascular;  Laterality: N/A;     Current Outpatient Medications  Medication  Sig Dispense Refill  . acetaminophen (TYLENOL) 325 MG tablet Take 2 tablets (650 mg total) by mouth every 4 (four) hours as needed for headache or mild pain. 30 tablet 2  . amitriptyline (ELAVIL) 10 MG tablet Take 1 tablet (10 mg total) by mouth at bedtime. 30 tablet 3  . amLODipine (NORVASC) 10 MG tablet Take 10 mg by mouth daily.    Marland Kitchen atorvastatin (LIPITOR) 80 MG tablet TAKE 1 TABLET BY MOUTH ONCE DAILY AT  6PM 90 tablet 0  . ELIQUIS 5 MG TABS tablet Take 1 tablet by mouth twice daily 180 tablet 2  . fexofenadine  (ALLEGRA) 180 MG tablet Take 1 tablet (180 mg total) by mouth daily. 30 tablet 0  . finasteride (PROSCAR) 5 MG tablet Take 5 mg by mouth daily.    . furosemide (LASIX) 40 MG tablet Take 1 tablet (40 mg total) by mouth daily as needed (swelling). 90 tablet 0  . irbesartan (AVAPRO) 150 MG tablet Take 1/2 (one-half) tablet by mouth once daily 45 tablet 3  . isosorbide mononitrate (IMDUR) 60 MG 24 hr tablet Take 1 tablet by mouth once daily 90 tablet 3  . lidocaine (LIDODERM) 5 % Place 1 patch onto the skin daily. Remove & Discard patch within 12 hours or as directed by MD 15 patch 0  . Multiple Vitamin (MULTIVITAMIN) tablet Take 1 tablet by mouth daily.    . nitroGLYCERIN (NITROSTAT) 0.4 MG SL tablet PLACE ONE TABLET UNDER THE TONGUE EVERY FIVE MINUTES FOR UP TO 3 DOSE AS NEEDED FOR CHEST PAIN. IF A SECOND DOSE IS TAKEN, CALL 911. 25 tablet 5  . pantoprazole (PROTONIX) 40 MG tablet Take 1 tablet by mouth once daily 90 tablet 2  . potassium chloride SA (KLOR-CON) 20 MEQ tablet Take 1 tablet (20 mEq total) by mouth daily as needed (Take with furosemide (lasix) only). 90 tablet 0  . tamsulosin (FLOMAX) 0.4 MG CAPS capsule Take 0.4 mg by mouth daily.      No current facility-administered medications for this visit.    Allergies:   Patient has no known allergies.    Social History:  The patient  reports that he has never smoked. He has never used smokeless tobacco. He reports that he does not drink alcohol and does not use drugs.   Family History:  The patient's family history includes Coronary artery disease in his brother, father, and sister; Hypertension in his mother.    ROS:  Please see the history of present illness.   Otherwise, review of systems are positive for back/hip pain.   All other systems are reviewed and negative.    PHYSICAL EXAM: VS:  BP 134/78   Pulse 73   Ht 5\' 9"  (1.753 m)   Wt 199 lb 3.2 oz (90.4 kg)   SpO2 96%   BMI 29.42 kg/m  , BMI Body mass index is 29.42  kg/m. GEN: Well nourished, well developed, in no acute distress  HEENT: normal  Neck: no JVD, carotid bruits, or masses Cardiac: RRR; no murmurs, rubs, or gallops,no edema  Respiratory:  clear to auscultation bilaterally, normal work of breathing GI: soft, nontender, nondistended, + BS MS: no deformity or atrophy  Skin: warm and dry, no rash Neuro:  Strength and sensation are intact Psych: euthymic mood, full affect    Recent Labs: 11/13/2019: ALT 21; TSH 1.080 05/30/2020: BUN 22; Creatinine, Ser 0.97; Hemoglobin 13.8; Platelets 196; Potassium 3.7; Sodium 137   Lipid Panel    Component Value  Date/Time   CHOL 136 11/13/2019 0741   TRIG 28 11/13/2019 0741   HDL 70 11/13/2019 0741   CHOLHDL 1.9 11/13/2019 0741   CHOLHDL 3.0 11/25/2016 0217   VLDL 7 11/25/2016 0217   LDLCALC 58 11/13/2019 0741     Other studies Reviewed: Additional studies/ records that were reviewed today with results demonstrating: ER records reviewed.   ASSESSMENT AND PLAN:  1. CAD/Old MI: No angina on medical therapy. Continue aggressive secondary prevention.  2. AFib: in NSR. On Eliquis.  No bleeding issues.   3. Hyperlipidemia: LDL 58 in 2/21.  Continue lipid lowering therapy. 4. HTN: The current medical regimen is effective;  continue present plan and medications.  Stay hydrated to avoid hyupotension.  5. LE edema much better today compared to 4/21.  OK to use compression stockings, 15-20 mm Hg. 6. OK for COVID vaccine bosster 6 months post second shot.   Current medicines are reviewed at length with the patient today.  The patient concerns regarding his medicines were addressed.  The following changes have been made:  No change  Labs/ tests ordered today include:  No orders of the defined types were placed in this encounter.   Recommend 150 minutes/week of aerobic exercise Low fat, low carb, high fiber diet recommended  Disposition:   FU in    Signed, Larae Grooms, MD  08/04/2020  3:39 PM    [facility] [place], [place], [place]  [address] Phone: [phone]; Fax: [phone]

## 2020-08-04 ENCOUNTER — Other Ambulatory Visit: Payer: Self-pay

## 2020-08-04 ENCOUNTER — Encounter: Payer: Self-pay | Admitting: Interventional Cardiology

## 2020-08-04 ENCOUNTER — Ambulatory Visit (INDEPENDENT_AMBULATORY_CARE_PROVIDER_SITE_OTHER): Payer: PPO | Admitting: Interventional Cardiology

## 2020-08-04 VITALS — BP 134/78 | HR 73 | Ht 69.0 in | Wt 199.2 lb

## 2020-08-04 DIAGNOSIS — I252 Old myocardial infarction: Secondary | ICD-10-CM | POA: Diagnosis not present

## 2020-08-04 DIAGNOSIS — E782 Mixed hyperlipidemia: Secondary | ICD-10-CM | POA: Diagnosis not present

## 2020-08-04 DIAGNOSIS — I4819 Other persistent atrial fibrillation: Secondary | ICD-10-CM

## 2020-08-04 DIAGNOSIS — I10 Essential (primary) hypertension: Secondary | ICD-10-CM | POA: Diagnosis not present

## 2020-08-04 DIAGNOSIS — I25119 Atherosclerotic heart disease of native coronary artery with unspecified angina pectoris: Secondary | ICD-10-CM | POA: Diagnosis not present

## 2020-08-04 NOTE — Patient Instructions (Signed)
Medication Instructions:  Your physician recommends that you continue on your current medications as directed. Please refer to the Current Medication list given to you today.  *If you need a refill on your cardiac medications before your next appointment, please call your pharmacy*   Lab Work: None  If you have labs (blood work) drawn today and your tests are completely normal, you will receive your results only by: Marland Kitchen MyChart Message (if you have MyChart) OR . A paper copy in the mail If you have any lab test that is abnormal or we need to change your treatment, we will call you to review the results.   Testing/Procedures: None   Follow-Up: At [facility], you and your health needs are our priority.  As part of our continuing mission to provide you with exceptional heart care, we have created designated Provider Care Teams.  These Care Teams include your primary Cardiologist (physician) and Advanced Practice Providers (APPs -  Physician Assistants and Nurse Practitioners) who all work together to provide you with the care you need, when you need it.  We recommend signing up for the patient portal called "MyChart".  Sign up information is provided on this After Visit Summary.  MyChart is used to connect with patients for Virtual Visits (Telemedicine).  Patients are able to view lab/test results, encounter notes, upcoming appointments, etc.  Non-urgent messages can be sent to your provider as well.   To learn more about what you can do with MyChart, go to [email or web address].    Your next appointment:   6 month(s)  The format for your next appointment:   In Person  Provider:   You may see Casandra Doffing, MD or one of the following Advanced Practice Providers on your designated Care Team:    Melina Copa, PA-C  Ermalinda Barrios, PA-C    Other Instructions We recommend that your wear compression stockings (15-20 mmHg) to help with swelling.

## 2020-11-08 ENCOUNTER — Other Ambulatory Visit: Payer: Self-pay | Admitting: Family Medicine

## 2020-12-09 ENCOUNTER — Telehealth: Payer: Self-pay | Admitting: Interventional Cardiology

## 2020-12-09 NOTE — Telephone Encounter (Signed)
Wife states that pt has been having a burning sensation in his chest everyday.  Inquired if it is associated with eating and wife states pt said he thinks it is.  Has not used any meds to see if any relief.  Sensation comes and goes.  No vitals available.  Nothing makes it worse or better.  Advised wife to keep appt with Dr. Irish Lack on Thursday since it was almost time for his next appt.  Also advised to have pt use TUMS the next time he has an episode and see if it helps and report this to [place] at appt.  Wife verbalized understanding and was appreciative for call.

## 2020-12-09 NOTE — Telephone Encounter (Signed)
Pt c/o of Chest Pain: STAT if CP now or developed within 24 hours  1. Are you having CP right now?  Burning in his chest- not at this time  2. Are you experiencing any other symptoms (ex. SOB, nausea, vomiting, sweating)?  no  3. How long have you been experiencing CP?  Longer than a month   CP continuous or coming and going? Comes and goes  5. Have you taken Nitroglycerin? No- pt wanted to be seen- I made an appt for Thursday(12-11-20) with Dr Irish Lack- please call to evaluate ?

## 2020-12-10 NOTE — Progress Notes (Deleted)
Cardiology Office Note   Date:  12/10/2020   ID:  Ronald Robinson, DOB 10/06/1941, MRN [id number]  PCP:  Pcp, No    No chief complaint on file.  CAD  Wt Readings from Last 3 Encounters:  08/04/20 199 lb 3.2 oz (90.4 kg)  05/30/20 194 lb 3.2 oz (88.1 kg)  02/01/20 198 lb 1.9 oz (89.9 kg)       History of Present Illness: Ronald Robinson is a 80 y.o. male  ***    Past Medical History:  Diagnosis Date  . CAD in native artery  11/26/2016  . Enlarged prostate   . Hyperlipidemia LDL goal <70 12/04/2016  . Hypertension     Past Surgical History:  Procedure Laterality Date  . HERNIA REPAIR    . LEFT HEART CATH AND CORONARY ANGIOGRAPHY N/A 11/24/2016   Procedure: Left Heart Cath and Coronary Angiography;  Surgeon: Troy Sine, MD;  Location: [facility];  Service: Cardiovascular;  Laterality: N/A;     Current Outpatient Medications  Medication Sig Dispense Refill  . acetaminophen (TYLENOL) 325 MG tablet Take 2 tablets (650 mg total) by mouth every 4 (four) hours as needed for headache or mild pain. 30 tablet 2  . amitriptyline (ELAVIL) 10 MG tablet Take 1 tablet (10 mg total) by mouth at bedtime. 30 tablet 3  . amLODipine (NORVASC) 10 MG tablet Take 10 mg by mouth daily.    Marland Kitchen atorvastatin (LIPITOR) 80 MG tablet TAKE 1 TABLET BY MOUTH ONCE DAILY AT  6PM 90 tablet 0  . ELIQUIS 5 MG TABS tablet Take 1 tablet by mouth twice daily 180 tablet 2  . fexofenadine (ALLEGRA) 180 MG tablet Take 1 tablet (180 mg total) by mouth daily. 30 tablet 0  . finasteride (PROSCAR) 5 MG tablet Take 5 mg by mouth daily.    . furosemide (LASIX) 40 MG tablet Take 1 tablet (40 mg total) by mouth daily as needed (swelling). 90 tablet 0  . irbesartan (AVAPRO) 150 MG tablet Take 1/2 (one-half) tablet by mouth once daily 45 tablet 3  . isosorbide mononitrate (IMDUR) 60 MG 24 hr tablet Take 1 tablet by mouth once daily 90 tablet 3  . lidocaine (LIDODERM) 5 % Place 1 patch onto the skin daily.  Remove & Discard patch within 12 hours or as directed by MD 15 patch 0  . Multiple Vitamin (MULTIVITAMIN) tablet Take 1 tablet by mouth daily.    . nitroGLYCERIN (NITROSTAT) 0.4 MG SL tablet PLACE ONE TABLET UNDER THE TONGUE EVERY FIVE MINUTES FOR UP TO 3 DOSE AS NEEDED FOR CHEST PAIN. IF A SECOND DOSE IS TAKEN, CALL 911. 25 tablet 5  . pantoprazole (PROTONIX) 40 MG tablet Take 1 tablet by mouth once daily 90 tablet 2  . potassium chloride SA (KLOR-CON) 20 MEQ tablet Take 1 tablet (20 mEq total) by mouth daily as needed (Take with furosemide (lasix) only). 90 tablet 0  . tamsulosin (FLOMAX) 0.4 MG CAPS capsule Take 0.4 mg by mouth daily.      No current facility-administered medications for this visit.    Allergies:   Patient has no known allergies.    Social History:  The patient  reports that he has never smoked. He has never used smokeless tobacco. He reports that he does not drink alcohol and does not use drugs.   Family History:  The patient's ***family history includes Coronary artery disease in his brother, father, and sister; Hypertension in his mother.  ROS:  Please see the history of present illness.   Otherwise, review of systems are positive for ***.   All other systems are reviewed and negative.    PHYSICAL EXAM: VS:  There were no vitals taken for this visit. , BMI There is no height or weight on file to calculate BMI. GEN: Well nourished, well developed, in no acute distress  HEENT: normal  Neck: no JVD, carotid bruits, or masses Cardiac: ***RRR; no murmurs, rubs, or gallops,no edema  Respiratory:  clear to auscultation bilaterally, normal work of breathing GI: soft, nontender, nondistended, + BS MS: no deformity or atrophy  Skin: warm and dry, no rash Neuro:  Strength and sensation are intact Psych: euthymic mood, full affect   EKG:   The ekg ordered today demonstrates ***   Recent Labs: 05/30/2020: BUN 22; Creatinine, Ser 0.97; Hemoglobin 13.8; Platelets 196;  Potassium 3.7; Sodium 137   Lipid Panel    Component Value Date/Time   CHOL 136 11/13/2019 0741   TRIG 28 11/13/2019 0741   HDL 70 11/13/2019 0741   CHOLHDL 1.9 11/13/2019 0741   CHOLHDL 3.0 11/25/2016 0217   VLDL 7 11/25/2016 0217   LDLCALC 58 11/13/2019 0741     Other studies Reviewed: Additional studies/ records that were reviewed today with results demonstrating: ***.   ASSESSMENT AND PLAN:  1. CAD/Old MI:  2. HTN: 3. Hyperlipidemia: 4. AFib: rate control and anticoagulation.   Current medicines are reviewed at length with the patient today.  The patient concerns regarding his medicines were addressed.  The following changes have been made:  No change***  Labs/ tests ordered today include: *** No orders of the defined types were placed in this encounter.   Recommend 150 minutes/week of aerobic exercise Low fat, low carb, high fiber diet recommended  Disposition:   FU in ***   Signed, Larae Grooms, MD  12/10/2020 9:17 PM    [facility] [place], [place], [place]  [address] Phone: [phone]; Fax: [phone]

## 2020-12-11 ENCOUNTER — Emergency Department (HOSPITAL_BASED_OUTPATIENT_CLINIC_OR_DEPARTMENT_OTHER)
Admission: EM | Admit: 2020-12-11 | Discharge: 2020-12-11 | Disposition: A | Discharge status: Home / Self Care | Payer: PPO | Attending: Emergency Medicine | Admitting: Emergency Medicine

## 2020-12-11 ENCOUNTER — Emergency Department (HOSPITAL_BASED_OUTPATIENT_CLINIC_OR_DEPARTMENT_OTHER): Payer: PPO

## 2020-12-11 ENCOUNTER — Ambulatory Visit: Payer: PPO | Admitting: Interventional Cardiology

## 2020-12-11 ENCOUNTER — Other Ambulatory Visit: Payer: Self-pay

## 2020-12-11 ENCOUNTER — Encounter (HOSPITAL_BASED_OUTPATIENT_CLINIC_OR_DEPARTMENT_OTHER): Payer: Self-pay | Admitting: *Deleted

## 2020-12-11 DIAGNOSIS — I10 Essential (primary) hypertension: Secondary | ICD-10-CM | POA: Insufficient documentation

## 2020-12-11 DIAGNOSIS — N179 Acute kidney failure, unspecified: Secondary | ICD-10-CM

## 2020-12-11 DIAGNOSIS — Z7901 Long term (current) use of anticoagulants: Secondary | ICD-10-CM | POA: Insufficient documentation

## 2020-12-11 DIAGNOSIS — I517 Cardiomegaly: Secondary | ICD-10-CM | POA: Diagnosis not present

## 2020-12-11 DIAGNOSIS — I25119 Atherosclerotic heart disease of native coronary artery with unspecified angina pectoris: Secondary | ICD-10-CM | POA: Insufficient documentation

## 2020-12-11 DIAGNOSIS — I4819 Other persistent atrial fibrillation: Secondary | ICD-10-CM

## 2020-12-11 DIAGNOSIS — I959 Hypotension, unspecified: Secondary | ICD-10-CM | POA: Diagnosis not present

## 2020-12-11 DIAGNOSIS — I4891 Unspecified atrial fibrillation: Secondary | ICD-10-CM | POA: Insufficient documentation

## 2020-12-11 DIAGNOSIS — Z79899 Other long term (current) drug therapy: Secondary | ICD-10-CM | POA: Insufficient documentation

## 2020-12-11 DIAGNOSIS — E785 Hyperlipidemia, unspecified: Secondary | ICD-10-CM | POA: Diagnosis not present

## 2020-12-11 DIAGNOSIS — E782 Mixed hyperlipidemia: Secondary | ICD-10-CM

## 2020-12-11 DIAGNOSIS — I214 Non-ST elevation (NSTEMI) myocardial infarction: Secondary | ICD-10-CM | POA: Diagnosis not present

## 2020-12-11 DIAGNOSIS — Z955 Presence of coronary angioplasty implant and graft: Secondary | ICD-10-CM | POA: Insufficient documentation

## 2020-12-11 DIAGNOSIS — I252 Old myocardial infarction: Secondary | ICD-10-CM

## 2020-12-11 LAB — URINALYSIS, ROUTINE W REFLEX MICROSCOPIC
Bilirubin Urine: NEGATIVE
Glucose, UA: NEGATIVE mg/dL
Hgb urine dipstick: NEGATIVE
Ketones, ur: NEGATIVE mg/dL
Leukocytes,Ua: NEGATIVE
Nitrite: NEGATIVE
Protein, ur: NEGATIVE mg/dL
Specific Gravity, Urine: 1.015 (ref 1.005–1.030)
pH: 6 (ref 5.0–8.0)

## 2020-12-11 LAB — COMPREHENSIVE METABOLIC PANEL
ALT: 27 U/L (ref 0–44)
AST: 33 U/L (ref 15–41)
Albumin: 3.4 g/dL — ABNORMAL LOW (ref 3.5–5.0)
Alkaline Phosphatase: 61 U/L (ref 38–126)
Anion gap: 10 (ref 5–15)
BUN: 24 mg/dL — ABNORMAL HIGH (ref 8–23)
CO2: 25 mmol/L (ref 22–32)
Calcium: 9 mg/dL (ref 8.9–10.3)
Chloride: 105 mmol/L (ref 98–111)
Creatinine, Ser: 1.34 mg/dL — ABNORMAL HIGH (ref 0.61–1.24)
GFR, Estimated: 54 mL/min — ABNORMAL LOW (ref >60)
Glucose, Bld: 143 mg/dL — ABNORMAL HIGH (ref 70–99)
Potassium: 3.7 mmol/L (ref 3.5–5.1)
Sodium: 140 mmol/L (ref 135–145)
Total Bilirubin: 0.4 mg/dL (ref 0.3–1.2)
Total Protein: 6.2 g/dL — ABNORMAL LOW (ref 6.5–8.1)

## 2020-12-11 LAB — TROPONIN I (HIGH SENSITIVITY): Troponin I (High Sensitivity): 195 ng/L (ref <18)

## 2020-12-11 LAB — CBC WITH DIFFERENTIAL/PLATELET
Abs Immature Granulocytes: 0 10*3/uL (ref 0.00–0.07)
Basophils Absolute: 0 10*3/uL (ref 0.0–0.1)
Basophils Relative: 1 %
Eosinophils Absolute: 0.1 10*3/uL (ref 0.0–0.5)
Eosinophils Relative: 1 %
HCT: 40 % (ref 39.0–52.0)
Hemoglobin: 13 g/dL (ref 13.0–17.0)
Immature Granulocytes: 0 %
Lymphocytes Relative: 27 %
Lymphs Abs: 1.2 10*3/uL (ref 0.7–4.0)
MCH: 26.2 pg (ref 26.0–34.0)
MCHC: 32.5 g/dL (ref 30.0–36.0)
MCV: 80.5 fL (ref 80.0–100.0)
Monocytes Absolute: 0.4 10*3/uL (ref 0.1–1.0)
Monocytes Relative: 10 %
Neutro Abs: 2.7 10*3/uL (ref 1.7–7.7)
Neutrophils Relative %: 61 %
Platelets: 185 10*3/uL (ref 150–400)
RBC: 4.97 MIL/uL (ref 4.22–5.81)
RDW: 16.2 % — ABNORMAL HIGH (ref 11.5–15.5)
WBC: 4.4 10*3/uL (ref 4.0–10.5)
nRBC: 0 % (ref 0.0–0.2)

## 2020-12-11 MED ORDER — SODIUM CHLORIDE 0.9 % IV BOLUS
500.0000 mL | Freq: Once | INTRAVENOUS | Status: AC
  Administered 2020-12-11: 500 mL via INTRAVENOUS

## 2020-12-11 MED ORDER — ISOSORBIDE MONONITRATE ER 60 MG PO TB24: 30.0000 mg | ORAL_TABLET | Freq: Every day | ORAL | 0 refills | Status: DC

## 2020-12-11 MED ORDER — ASPIRIN 81 MG PO CHEW
324.0000 mg | CHEWABLE_TABLET | Freq: Once | ORAL | Status: AC
  Administered 2020-12-11: 324 mg via ORAL
  Filled 2020-12-11: qty 4

## 2020-12-11 NOTE — ED Notes (Signed)
Pt. Reports he has had a little blurred vision at work today.  Pt. Has no other symptoms except his B/P was low at work as well.

## 2020-12-11 NOTE — ED Provider Notes (Signed)
[facility] EMERGENCY DEPARTMENT Provider Note   CSN: [id number] Arrival date & time: 12/11/20  1327     History Chief Complaint  Patient presents with  . Hypotension    Ronald Robinson is a 80 y.o. male.  HPI 80 year old male presents with hypotension.  History is from patient and spouse.  Patient's blood pressure was 97 yesterday.  It went down into the 70s today and 90s today.  Patient feels okay.  His glucose was elevated earlier today and so he licked some salt which he thinks helped.  Otherwise he thinks he is been eating and drinking okay.  No fever, cough, shortness of breath, vomiting or diarrhea.  His urine has been dark for the last couple weeks.  However no pain.  He has been having chest burning for months.  He is due to see his cardiologist today for this.   Past Medical History:  Diagnosis Date  . CAD in native artery  11/26/2016  . Enlarged prostate   . Hyperlipidemia LDL goal <70 12/04/2016  . Hypertension     Patient Active Problem List   Diagnosis Date Noted  . Situational anxiety 01/04/2019  . Cough due to ACE inhibitor 08/01/2017  . Unstable angina ([place]) 12/04/2016  . Hyperlipidemia LDL goal <70 12/04/2016  . Coronary artery disease due to lipid rich plaque 11/26/2016  . S/P cardiac cath: Severe 3 vessel disease, not a candidate for CABG. Medical treatment 11/26/2016  . Non-ST elevation (NSTEMI) myocardial infarction ([place]) 11/24/2016  . Family history of coronary artery disease 11/24/2016  . PAF (paroxysmal atrial fibrillation) ([place]) 11/24/2016  . BPH (benign prostatic hyperplasia) 11/24/2016  . Essential hypertension 01/12/2011  . KNEE PAIN, LEFT, ACUTE 01/12/2011    Past Surgical History:  Procedure Laterality Date  . HERNIA REPAIR    . LEFT HEART CATH AND CORONARY ANGIOGRAPHY N/A 11/24/2016   Procedure: Left Heart Cath and Coronary Angiography;  Surgeon: Troy Sine, MD;  Location: [facility];  Service: Cardiovascular;   Laterality: N/A;       Family History  Problem Relation Age of Onset  . Coronary artery disease Father   . Coronary artery disease Sister   . Coronary artery disease Brother   . Hypertension Mother     Social History   Tobacco Use  . Smoking status: Never Smoker  . Smokeless tobacco: Never Used  Vaping Use  . Vaping Use: Never used  Substance Use Topics  . Alcohol use: No  . Drug use: No    Home Medications Prior to Admission medications   Medication Sig Start Date End Date Taking? Authorizing Provider  acetaminophen (TYLENOL) 325 MG tablet Take 2 tablets (650 mg total) by mouth every 4 (four) hours as needed for headache or mild pain. 11/26/16   Delos Haring, PA-C  amitriptyline (ELAVIL) 10 MG tablet Take 1 tablet (10 mg total) by mouth at bedtime. 12/07/18   [name], DO  atorvastatin (LIPITOR) 80 MG tablet TAKE 1 TABLET BY MOUTH ONCE DAILY AT  6PM 11/10/20   [name], DO  ELIQUIS 5 MG TABS tablet Take 1 tablet by mouth twice daily 06/09/20   Jettie Booze, MD  fexofenadine (ALLEGRA) 180 MG tablet Take 1 tablet (180 mg total) by mouth daily. 03/21/17   [name], DO  finasteride (PROSCAR) 5 MG tablet Take 5 mg by mouth daily.    [provider]  furosemide (LASIX) 40 MG tablet Take 1 tablet (40 mg  total) by mouth daily as needed (swelling). 03/03/20   Jettie Booze, MD  isosorbide mononitrate (IMDUR) 60 MG 24 hr tablet Take 0.5 tablets (30 mg total) by mouth daily. 12/11/20   Sherwood Gambler, MD  lidocaine (LIDODERM) 5 % Place 1 patch onto the skin daily. Remove & Discard patch within 12 hours or as directed by MD 07/02/19   Tegeler, Gwenyth Allegra, MD  Multiple Vitamin (MULTIVITAMIN) tablet Take 1 tablet by mouth daily.    [provider]  nitroGLYCERIN (NITROSTAT) 0.4 MG SL tablet PLACE ONE TABLET UNDER THE TONGUE EVERY FIVE MINUTES FOR UP TO 3 DOSE AS NEEDED FOR CHEST PAIN. IF A SECOND DOSE IS TAKEN, CALL  911. 07/15/20   Jettie Booze, MD  pantoprazole (PROTONIX) 40 MG tablet Take 1 tablet by mouth once daily 07/14/20   Jettie Booze, MD  potassium chloride SA (KLOR-CON) 20 MEQ tablet Take 1 tablet (20 mEq total) by mouth daily as needed (Take with furosemide (lasix) only). 03/03/20   Jettie Booze, MD  tamsulosin (FLOMAX) 0.4 MG CAPS capsule Take 0.4 mg by mouth daily.     [provider]  irbesartan (AVAPRO) 150 MG tablet Take 1/2 (one-half) tablet by mouth once daily 11/05/19 12/11/20  Jettie Booze, MD    Allergies    Patient has no known allergies.  Review of Systems   Review of Systems  Constitutional: Negative for fever.  Respiratory: Negative for cough and shortness of breath.   Cardiovascular: Positive for chest pain.  Gastrointestinal: Negative for abdominal pain, diarrhea and vomiting.  Genitourinary: Negative for dysuria.  Neurological: Negative for dizziness, weakness and headaches.  All other systems reviewed and are negative.   Physical Exam Updated Vital Signs BP (!) 128/59 (BP Location: Right Arm)   Pulse 64   Temp 98.1 F (36.7 C) (Oral)   Resp (!) 23   Ht 5\' 9"  (1.753 m)   Wt 91.1 kg   SpO2 99%   BMI 29.67 kg/m   Physical Exam Vitals and nursing note reviewed.  Constitutional:      Appearance: He is well-developed and well-nourished.  HENT:     Head: Normocephalic and atraumatic.     Right Ear: External ear normal.     Left Ear: External ear normal.     Nose: Nose normal.  Eyes:     General:        Right eye: No discharge.        Left eye: No discharge.     Extraocular Movements: Extraocular movements intact.     Pupils: Pupils are equal, round, and reactive to light.  Cardiovascular:     Rate and Rhythm: Normal rate and regular rhythm.     Heart sounds: Normal heart sounds.  Pulmonary:     Effort: Pulmonary effort is normal.     Breath sounds: Normal breath sounds.  Abdominal:     Palpations: Abdomen is soft.      Tenderness: There is no abdominal tenderness.  Musculoskeletal:        General: No edema.     Cervical back: Neck supple.  Skin:    General: Skin is warm and dry.  Neurological:     Mental Status: He is alert.     Comments: CN 3-12 grossly intact. 5/5 strength in all 4 extremities. Grossly normal sensation. Normal finger to nose.   Psychiatric:        Mood and Affect: Mood is not anxious.  ED Results / Procedures / Treatments   Labs (all labs ordered are listed, but only abnormal results are displayed) Labs Reviewed  COMPREHENSIVE METABOLIC PANEL - Abnormal; Notable for the following components:      Result Value   Glucose, Bld 143 (*)    BUN 24 (*)    Creatinine, Ser 1.34 (*)    Total Protein 6.2 (*)    Albumin 3.4 (*)    GFR, Estimated 54 (*)    All other components within normal limits  CBC WITH DIFFERENTIAL/PLATELET - Abnormal; Notable for the following components:   RDW 16.2 (*)    All other components within normal limits  TROPONIN I (HIGH SENSITIVITY) - Abnormal; Notable for the following components:   Troponin I (High Sensitivity) 195 (*)    All other components within normal limits  URINALYSIS, ROUTINE W REFLEX MICROSCOPIC  TROPONIN I (HIGH SENSITIVITY)    EKG EKG Interpretation  Date/Time:  Thursday December 11 2020 13:49:07 EST Ventricular Rate:  79 PR Interval:    QRS Duration: 91 QT Interval:  389 QTC Calculation: 446 R Axis:   2 Text Interpretation: Sinus rhythm Repol abnrm suggests ischemia, lateral leads Confirmed by Sherwood Gambler [phone]) on 12/11/2020 2:18:12 PM   Radiology DG Chest Portable 1 View  Result Date: 12/11/2020 CLINICAL DATA:  Decreased blood pressure. Hypertension and hyperlipidemia. Chest burning. EXAM: PORTABLE CHEST 1 VIEW COMPARISON:  03/21/2017 FINDINGS: Mild cardiac enlargement. Aortic atherosclerosis. Both lungs are clear. The visualized skeletal structures are unremarkable. IMPRESSION: No active disease. Electronically Signed    By: Kerby Moors M.D.   On: 12/11/2020 14:47    Procedures Procedures   Medications Ordered in ED Medications  aspirin chewable tablet 324 mg (has no administration in time range)  sodium chloride 0.9 % bolus 500 mL (0 mLs Intravenous Stopped 12/11/20 1523)  sodium chloride 0.9 % bolus 500 mL (500 mLs Intravenous New Bag/Given 12/11/20 1537)    ED Course  I have reviewed the triage vital signs and the nursing notes.  Pertinent labs & imaging results that were available during my care of the patient were reviewed by me and considered in my medical decision making (see chart for details).    MDM Rules/Calculators/A&P                          Patient's ECG shows nonspecific ST changes.  He is not having active chest pain but has been having this nonspecific chest burning for a while.  Thus labs and troponin sent.  Troponin is quite elevated at 195.  Is possible this is all secondary to hypotension with his significant coronary disease.  However I recommended he be admitted for further management and care.  He is adamant that he does not want to be admitted to [facility] and I discussed this is where our cardiologist are but he is still not wanting to go.  He understands he could have further heart damage/heart attack, heart failure, or death.  He still wants to leave AMA and seems to understand this.  As far as his labs otherwise he is showing a mild acute kidney injury.  I did discuss with cardiology, Dr. Harrell Gave, who recommends stopping the irbesartan.  He is no longer on the Norvasc but would also cut the Imdur in half.  Return precautions given and discussed. Final Clinical Impression(s) / ED Diagnoses Final diagnoses:  Hypotension, unspecified hypotension type  Acute kidney injury [facility])  NSTEMI (non-ST  elevated myocardial infarction) ([place])    Rx / DC Orders ED Discharge Orders         Ordered    isosorbide mononitrate (IMDUR) 60 MG 24 hr tablet  Daily        12/11/20 1536            Sherwood Gambler, MD 12/11/20 1538

## 2020-12-11 NOTE — ED Notes (Signed)
Told EDP Pickering about Pt. Troponin being 195

## 2020-12-11 NOTE — ED Notes (Signed)
Patient is resting comfortably. 

## 2020-12-11 NOTE — ED Triage Notes (Signed)
States his BP has been dropping. He has been using an at home machine to take his BP. He is ambulatory.

## 2020-12-11 NOTE — Discharge Instructions (Addendum)
STOP the IRBESARTAN. Reduce your IMDUR in half (30 mg). Call your cardiologist for follow up  If at any point you get new or worsening chest pain, shortness of breath, weakness, or change your mind and want to come back to the hospital then please come back as soon as possible or call 911.

## 2020-12-15 DIAGNOSIS — I252 Old myocardial infarction: Secondary | ICD-10-CM | POA: Diagnosis not present

## 2020-12-15 DIAGNOSIS — I214 Non-ST elevation (NSTEMI) myocardial infarction: Secondary | ICD-10-CM | POA: Diagnosis not present

## 2020-12-15 DIAGNOSIS — I480 Paroxysmal atrial fibrillation: Secondary | ICD-10-CM | POA: Diagnosis not present

## 2020-12-15 DIAGNOSIS — I959 Hypotension, unspecified: Secondary | ICD-10-CM | POA: Diagnosis not present

## 2020-12-15 DIAGNOSIS — N179 Acute kidney failure, unspecified: Secondary | ICD-10-CM | POA: Diagnosis not present

## 2020-12-15 NOTE — Progress Notes (Signed)
Cardiology Office Note   Date:  12/22/2020   ID:  Ronald Robinson, DOB 16-Jul-1941, MRN [id number]  PCP:  Pcp, No    No chief complaint on file.  CAD/Old MI  Wt Readings from Last 3 Encounters:  12/22/20 201 lb (91.2 kg)  12/11/20 200 lb 14.4 oz (91.1 kg)  08/04/20 199 lb 3.2 oz (90.4 kg)       History of Present Illness: Ronald Robinson is a 80 y.o. male  with prior h/o HTN who was recently diagnosed with CAD. He initially presented to [facility] on 11/23/16 with a complaint of SSCP. In the ED his EKG initially showed AF with CVR. He was unaware of palpitations or tachycardia. His Troponin was 1.34-2.13. His subsequent EKGs show subtle ST elevations and TWI. Pt was transferred to [facility] for cardiology admission. Patient had a cardiac cath using the right radial approach which showed diffuse multivessel CAD involving the LAD, ramus intermediate, left circumflex, and RCA. EF normal at 55-60%. Pt not amenable to stent placement. CTS consulted and saw patient, they do not feel like CABG is the best treatment, his LAD system not graft able. The LCX and RCA systems are diffusely diseased and it is not possible to graft beyond the disease. Dr. Cyndia Bent recommended medical therapy. He was placed on ASA, Plavix, Imdur and Lisinopril.  Was readmitted on 2/24/18with chest pain, but this time new Afib in RVR. Amiodarone was added and Plavix was changed to apixaban and he was discharged after two days.  In early March2018, he had blurry vision. All he could see was the color brown. Discharge diagnosis was "-Bilateral and complete loss of vision is typically a symptom of low blood pressure/pre-syncope, rather than CVA." -Started on Imdur, BB, Ranexa and amiodarone.  -CTA neck showed no evidence of severe vertebrobasilar disease -MR brain negativefor acute CVA -Orthostatic as negative -Symptoms resolved, not a lot canbe added from neurology standpoint, patient already on Eliquis and LDof  aspirin.  Stopped Ranexa.In the past, he has some left sided pain that resolves with Tylenol.  In the past,Rare Exertional chest tightness if he walks fast. Not with regular activity or his job.  In 11/19, he lost his job under difficult circumstances- he has an age discrimination complaint pending. He had some anxiety. He took some meds for this. He is coping with it better now.  He tested positive for COVID in 08/2019. He tested negative in 09/2019.He was asymptomatic.   He did get a vaccine.  Two shots Pfizer.  He got a new job after a dispute with his former employer.    He works with [employer].  Episode of dizziness while at work in 8/21.  He went to the ER and his BP was fluctuating.  Resolved with IV fluids. Troponins negative.   He had hypotension in early March 2022 with records from the ER showing: "Patient's blood pressure was 97 yesterday.  It went down into the 70s today and 90s today.  Patient feels okay.  His glucose was elevated earlier today and so he licked some salt which he thinks helped.  Otherwise he thinks he is been eating and drinking okay.  No fever, cough, shortness of breath, vomiting or diarrhea.  His urine has been dark for the last couple weeks.  However no pain.  He has been having chest burning for months. "  Troponin was mildly elevated but he declined admission.   Avapro was stopped by the ER MD.   Of  late, his BP has not been low.  He did not take his med this morning.  He noted that his urine was dark.  He fels the urine cleared up with drinking more water.   He was diagnosed with multiple sclerosis per his report.   Since ER visit: Denies : Chest pain. Dizziness. Leg edema. Nitroglycerin use. Orthopnea. Palpitations. Paroxysmal nocturnal dyspnea. Shortness of breath. Syncope.    Past Medical History:  Diagnosis Date  . CAD in native artery  11/26/2016  . Enlarged prostate   . Hyperlipidemia LDL goal <70 12/04/2016  . Hypertension      Past Surgical History:  Procedure Laterality Date  . HERNIA REPAIR    . LEFT HEART CATH AND CORONARY ANGIOGRAPHY N/A 11/24/2016   Procedure: Left Heart Cath and Coronary Angiography;  Surgeon: Troy Sine, MD;  Location: [facility];  Service: Cardiovascular;  Laterality: N/A;     Current Outpatient Medications  Medication Sig Dispense Refill  . acetaminophen (TYLENOL) 325 MG tablet Take 2 tablets (650 mg total) by mouth every 4 (four) hours as needed for headache or mild pain. 30 tablet 2  . amitriptyline (ELAVIL) 10 MG tablet Take 1 tablet (10 mg total) by mouth at bedtime. 30 tablet 3  . atorvastatin (LIPITOR) 80 MG tablet TAKE 1 TABLET BY MOUTH ONCE DAILY AT  6PM 90 tablet 0  . ELIQUIS 5 MG TABS tablet Take 1 tablet by mouth twice daily 180 tablet 2  . fexofenadine (ALLEGRA) 180 MG tablet Take 1 tablet (180 mg total) by mouth daily. 30 tablet 0  . finasteride (PROSCAR) 5 MG tablet Take 5 mg by mouth daily.    . furosemide (LASIX) 40 MG tablet Take 1 tablet (40 mg total) by mouth daily as needed (swelling). 90 tablet 0  . isosorbide mononitrate (IMDUR) 60 MG 24 hr tablet Take 0.5 tablets (30 mg total) by mouth daily. 30 tablet 0  . Multiple Vitamin (MULTIVITAMIN) tablet Take 1 tablet by mouth daily.    . nitroGLYCERIN (NITROSTAT) 0.4 MG SL tablet PLACE ONE TABLET UNDER THE TONGUE EVERY FIVE MINUTES FOR UP TO 3 DOSE AS NEEDED FOR CHEST PAIN. IF A SECOND DOSE IS TAKEN, CALL 911. 25 tablet 5  . PACERONE 200 MG tablet Take 200 mg by mouth daily.    . pantoprazole (PROTONIX) 40 MG tablet Take 1 tablet by mouth once daily 90 tablet 2  . potassium chloride SA (KLOR-CON) 20 MEQ tablet Take 1 tablet (20 mEq total) by mouth daily as needed (Take with furosemide (lasix) only). 90 tablet 0  . tamsulosin (FLOMAX) 0.4 MG CAPS capsule Take 0.4 mg by mouth daily.      No current facility-administered medications for this visit.    Allergies:   Patient has no known allergies.     Social History:  The patient  reports that he has never smoked. He has never used smokeless tobacco. He reports that he does not drink alcohol and does not use drugs.   Family History:  The patient's *family history includes Coronary artery disease in his brother, father, and sister; Hypertension in his mother.    ROS:  Please see the history of present illness.   Otherwise, review of systems are positive for episode of lightheadedness.   All other systems are reviewed and negative.    PHYSICAL EXAM: VS:  BP (!) 162/80   Pulse (!) 57   Ht 5\' 9"  (1.753 m)   Wt 201 lb (91.2 kg)  SpO2 98%   BMI 29.68 kg/m  , BMI Body mass index is 29.68 kg/m. GEN: Well nourished, well developed, in no acute distress  HEENT: normal  Neck: no JVD, carotid bruits, or masses Cardiac: RRR; no murmurs, rubs, or gallops,no edema  Respiratory:  clear to auscultation bilaterally, normal work of breathing GI: soft, nontender, nondistended, + BS MS: no deformity or atrophy  Skin: warm and dry, no rash Neuro:  Strength and sensation are intact Psych: euthymic mood, full affect   EKG:   The ekg ordered today demonstrates NSR, PRWP, Nonspecific ST changes   Recent Labs: 12/11/2020: ALT 27; BUN 24; Creatinine, Ser 1.34; Hemoglobin 13.0; Platelets 185; Potassium 3.7; Sodium 140   Lipid Panel    Component Value Date/Time   CHOL 136 11/13/2019 0741   TRIG 28 11/13/2019 0741   HDL 70 11/13/2019 0741   CHOLHDL 1.9 11/13/2019 0741   CHOLHDL 3.0 11/25/2016 0217   VLDL 7 11/25/2016 0217   LDLCALC 58 11/13/2019 0741     Other studies Reviewed: Additional studies/ records that were reviewed today with results demonstrating: ER records reviewed; labs reviewed .   ASSESSMENT AND PLAN:  1. CAD/Old MI: Recent episode of chest burning with mildly elevated cardiac markers.  Overeating stimulate the CP.  Walking helps reduce the symptoms.  Avoid eating late at night. No ischemic testing at this time since he  feels better with activity. 2. HTN: Some low blood pressure readings.  Avoid excessive salt.  BP high today and higher at home of late. Start amlodipine 2.5 mg daily.   3. Hyperlipidemia: Whole food, plant-based diet.   4. AFib: Currently in NSR.  Eliquis for stroke prevention.  5. Anticoagulated: No bleeding problems.  6. CKD: Cr was high in ER- may have been dehydration give urine changes he noted.  Will recheck.     Current medicines are reviewed at length with the patient today.  The patient concerns regarding his medicines were addressed.  The following changes have been made:  Add amlodipine 2.5 mg daily- stay hydrated  Labs/ tests ordered today include:  No orders of the defined types were placed in this encounter.   Recommend 150 minutes/week of aerobic exercise Low fat, low carb, high fiber diet recommended  Disposition:   FU in 6 months- send home BP readings to [place]   Signed, Larae Grooms, MD  12/22/2020 8:26 AM    [facility] [place], [place],   [address] Phone: [phone]; Fax: [phone]

## 2020-12-22 ENCOUNTER — Other Ambulatory Visit: Payer: Self-pay

## 2020-12-22 ENCOUNTER — Encounter: Payer: Self-pay | Admitting: Interventional Cardiology

## 2020-12-22 ENCOUNTER — Ambulatory Visit (INDEPENDENT_AMBULATORY_CARE_PROVIDER_SITE_OTHER): Payer: PPO | Admitting: Interventional Cardiology

## 2020-12-22 VITALS — BP 162/80 | HR 57 | Ht 69.0 in | Wt 201.0 lb

## 2020-12-22 DIAGNOSIS — R600 Localized edema: Secondary | ICD-10-CM | POA: Diagnosis not present

## 2020-12-22 DIAGNOSIS — I252 Old myocardial infarction: Secondary | ICD-10-CM

## 2020-12-22 DIAGNOSIS — I10 Essential (primary) hypertension: Secondary | ICD-10-CM

## 2020-12-22 DIAGNOSIS — I25119 Atherosclerotic heart disease of native coronary artery with unspecified angina pectoris: Secondary | ICD-10-CM

## 2020-12-22 DIAGNOSIS — E782 Mixed hyperlipidemia: Secondary | ICD-10-CM | POA: Diagnosis not present

## 2020-12-22 DIAGNOSIS — I4819 Other persistent atrial fibrillation: Secondary | ICD-10-CM | POA: Diagnosis not present

## 2020-12-22 LAB — BASIC METABOLIC PANEL
BUN/Creatinine Ratio: 11 (ref 10–24)
BUN: 12 mg/dL (ref 8–27)
CO2: 25 mmol/L (ref 20–29)
Calcium: 9.4 mg/dL (ref 8.6–10.2)
Chloride: 105 mmol/L (ref 96–106)
Creatinine, Ser: 1.1 mg/dL (ref 0.76–1.27)
Glucose: 88 mg/dL (ref 65–99)
Potassium: 4.5 mmol/L (ref 3.5–5.2)
Sodium: 142 mmol/L (ref 134–144)
eGFR: 69 mL/min/{1.73_m2} (ref >59)

## 2020-12-22 MED ORDER — AMLODIPINE BESYLATE 2.5 MG PO TABS: 2.5000 mg | ORAL_TABLET | Freq: Every day | ORAL | 3 refills | Status: AC

## 2020-12-22 NOTE — Patient Instructions (Signed)
Medication Instructions:  Your physician has recommended you make the following change in your medication: Start Amlodipine 2.5 mg by mouth daily.  *If you need a refill on your cardiac medications before your next appointment, please call your pharmacy*   Lab Work: Lab work to be done today--BMP If you have labs (blood work) drawn today and your tests are completely normal, you will receive your results only by: Marland Kitchen MyChart Message (if you have MyChart) OR . A paper copy in the mail If you have any lab test that is abnormal or we need to change your treatment, we will call you to review the results.   Testing/Procedures: none   Follow-Up: At [facility], you and your health needs are our priority.  As part of our continuing mission to provide you with exceptional heart care, we have created designated Provider Care Teams.  These Care Teams include your primary Cardiologist (physician) and Advanced Practice Providers (APPs -  Physician Assistants and Nurse Practitioners) who all work together to provide you with the care you need, when you need it.  We recommend signing up for the patient portal called "MyChart".  Sign up information is provided on this After Visit Summary.  MyChart is used to connect with patients for Virtual Visits (Telemedicine).  Patients are able to view lab/test results, encounter notes, upcoming appointments, etc.  Non-urgent messages can be sent to your provider as well.   To learn more about what you can do with MyChart, go to [email or web address].    Your next appointment:   August 8,2022 at 8:00  The format for your next appointment:   In Person  Provider:   Casandra Doffing, MD   Other Instructions Check BP at home for next 2-3 weeks and call readings to office.  Call before then if top number consistently running 140 or greater

## 2020-12-28 ENCOUNTER — Other Ambulatory Visit: Payer: Self-pay | Admitting: Interventional Cardiology

## 2020-12-30 ENCOUNTER — Other Ambulatory Visit: Payer: Self-pay | Admitting: Interventional Cardiology

## 2021-01-11 DIAGNOSIS — E785 Hyperlipidemia, unspecified: Secondary | ICD-10-CM | POA: Diagnosis not present

## 2021-01-11 DIAGNOSIS — I10 Essential (primary) hypertension: Secondary | ICD-10-CM | POA: Diagnosis not present

## 2021-01-11 DIAGNOSIS — Z7901 Long term (current) use of anticoagulants: Secondary | ICD-10-CM | POA: Diagnosis not present

## 2021-01-11 DIAGNOSIS — I2510 Atherosclerotic heart disease of native coronary artery without angina pectoris: Secondary | ICD-10-CM | POA: Diagnosis not present

## 2021-01-11 DIAGNOSIS — J029 Acute pharyngitis, unspecified: Secondary | ICD-10-CM | POA: Diagnosis not present

## 2021-01-11 DIAGNOSIS — Z79899 Other long term (current) drug therapy: Secondary | ICD-10-CM | POA: Diagnosis not present

## 2021-01-11 DIAGNOSIS — R059 Cough, unspecified: Secondary | ICD-10-CM | POA: Diagnosis not present

## 2021-01-11 DIAGNOSIS — F419 Anxiety disorder, unspecified: Secondary | ICD-10-CM | POA: Diagnosis not present

## 2021-01-11 DIAGNOSIS — Z20822 Contact with and (suspected) exposure to covid-19: Secondary | ICD-10-CM | POA: Diagnosis not present

## 2021-01-12 DIAGNOSIS — E785 Hyperlipidemia, unspecified: Secondary | ICD-10-CM | POA: Diagnosis not present

## 2021-01-12 DIAGNOSIS — I2510 Atherosclerotic heart disease of native coronary artery without angina pectoris: Secondary | ICD-10-CM | POA: Diagnosis not present

## 2021-01-12 DIAGNOSIS — I10 Essential (primary) hypertension: Secondary | ICD-10-CM | POA: Diagnosis not present

## 2021-01-12 DIAGNOSIS — I2583 Coronary atherosclerosis due to lipid rich plaque: Secondary | ICD-10-CM | POA: Diagnosis not present

## 2021-01-12 DIAGNOSIS — I214 Non-ST elevation (NSTEMI) myocardial infarction: Secondary | ICD-10-CM | POA: Diagnosis not present

## 2021-01-12 DIAGNOSIS — I480 Paroxysmal atrial fibrillation: Secondary | ICD-10-CM | POA: Diagnosis not present

## 2021-01-19 DIAGNOSIS — D500 Iron deficiency anemia secondary to blood loss (chronic): Secondary | ICD-10-CM | POA: Diagnosis not present

## 2021-01-19 DIAGNOSIS — K219 Gastro-esophageal reflux disease without esophagitis: Secondary | ICD-10-CM | POA: Diagnosis not present

## 2021-01-19 DIAGNOSIS — N400 Enlarged prostate without lower urinary tract symptoms: Secondary | ICD-10-CM | POA: Diagnosis not present

## 2021-01-19 DIAGNOSIS — I700 Atherosclerosis of aorta: Secondary | ICD-10-CM | POA: Diagnosis not present

## 2021-01-19 DIAGNOSIS — I4819 Other persistent atrial fibrillation: Secondary | ICD-10-CM | POA: Diagnosis not present

## 2021-01-19 DIAGNOSIS — Z79899 Other long term (current) drug therapy: Secondary | ICD-10-CM | POA: Diagnosis not present

## 2021-01-19 DIAGNOSIS — I083 Combined rheumatic disorders of mitral, aortic and tricuspid valves: Secondary | ICD-10-CM | POA: Diagnosis not present

## 2021-01-19 DIAGNOSIS — I081 Rheumatic disorders of both mitral and tricuspid valves: Secondary | ICD-10-CM | POA: Diagnosis not present

## 2021-01-19 DIAGNOSIS — R079 Chest pain, unspecified: Secondary | ICD-10-CM | POA: Diagnosis not present

## 2021-01-19 DIAGNOSIS — I2510 Atherosclerotic heart disease of native coronary artery without angina pectoris: Secondary | ICD-10-CM | POA: Diagnosis not present

## 2021-01-19 DIAGNOSIS — R7401 Elevation of levels of liver transaminase levels: Secondary | ICD-10-CM | POA: Diagnosis not present

## 2021-01-19 DIAGNOSIS — I519 Heart disease, unspecified: Secondary | ICD-10-CM | POA: Diagnosis not present

## 2021-01-19 DIAGNOSIS — I4892 Unspecified atrial flutter: Secondary | ICD-10-CM | POA: Diagnosis not present

## 2021-01-19 DIAGNOSIS — R9431 Abnormal electrocardiogram [ECG] [EKG]: Secondary | ICD-10-CM | POA: Diagnosis not present

## 2021-01-19 DIAGNOSIS — I5022 Chronic systolic (congestive) heart failure: Secondary | ICD-10-CM | POA: Diagnosis not present

## 2021-01-19 DIAGNOSIS — I517 Cardiomegaly: Secondary | ICD-10-CM | POA: Diagnosis not present

## 2021-01-19 DIAGNOSIS — R918 Other nonspecific abnormal finding of lung field: Secondary | ICD-10-CM | POA: Diagnosis not present

## 2021-01-19 DIAGNOSIS — E785 Hyperlipidemia, unspecified: Secondary | ICD-10-CM | POA: Diagnosis not present

## 2021-01-19 DIAGNOSIS — I483 Typical atrial flutter: Secondary | ICD-10-CM | POA: Diagnosis not present

## 2021-01-19 DIAGNOSIS — R0602 Shortness of breath: Secondary | ICD-10-CM | POA: Diagnosis not present

## 2021-01-19 DIAGNOSIS — I480 Paroxysmal atrial fibrillation: Secondary | ICD-10-CM | POA: Diagnosis not present

## 2021-01-19 DIAGNOSIS — I484 Atypical atrial flutter: Secondary | ICD-10-CM | POA: Diagnosis not present

## 2021-01-19 DIAGNOSIS — I080 Rheumatic disorders of both mitral and aortic valves: Secondary | ICD-10-CM | POA: Diagnosis not present

## 2021-01-19 DIAGNOSIS — R0989 Other specified symptoms and signs involving the circulatory and respiratory systems: Secondary | ICD-10-CM | POA: Diagnosis not present

## 2021-01-19 DIAGNOSIS — R0789 Other chest pain: Secondary | ICD-10-CM | POA: Diagnosis not present

## 2021-01-19 DIAGNOSIS — Z7982 Long term (current) use of aspirin: Secondary | ICD-10-CM | POA: Diagnosis not present

## 2021-01-19 DIAGNOSIS — I214 Non-ST elevation (NSTEMI) myocardial infarction: Secondary | ICD-10-CM | POA: Diagnosis not present

## 2021-01-19 DIAGNOSIS — Z7901 Long term (current) use of anticoagulants: Secondary | ICD-10-CM | POA: Diagnosis not present

## 2021-01-19 DIAGNOSIS — I10 Essential (primary) hypertension: Secondary | ICD-10-CM | POA: Diagnosis not present

## 2021-01-19 DIAGNOSIS — J90 Pleural effusion, not elsewhere classified: Secondary | ICD-10-CM | POA: Diagnosis not present

## 2021-01-19 DIAGNOSIS — I499 Cardiac arrhythmia, unspecified: Secondary | ICD-10-CM | POA: Diagnosis not present

## 2021-01-19 DIAGNOSIS — R000 Tachycardia, unspecified: Secondary | ICD-10-CM | POA: Diagnosis not present

## 2021-01-19 DIAGNOSIS — R072 Precordial pain: Secondary | ICD-10-CM | POA: Diagnosis not present

## 2021-01-19 DIAGNOSIS — D649 Anemia, unspecified: Secondary | ICD-10-CM | POA: Diagnosis not present

## 2021-01-19 DIAGNOSIS — I2583 Coronary atherosclerosis due to lipid rich plaque: Secondary | ICD-10-CM | POA: Diagnosis not present

## 2021-01-19 DIAGNOSIS — E782 Mixed hyperlipidemia: Secondary | ICD-10-CM | POA: Diagnosis not present

## 2021-01-19 DIAGNOSIS — I252 Old myocardial infarction: Secondary | ICD-10-CM | POA: Diagnosis not present

## 2021-01-19 DIAGNOSIS — I5189 Other ill-defined heart diseases: Secondary | ICD-10-CM | POA: Diagnosis not present

## 2021-01-19 DIAGNOSIS — I110 Hypertensive heart disease with heart failure: Secondary | ICD-10-CM | POA: Diagnosis not present

## 2021-01-19 DIAGNOSIS — I255 Ischemic cardiomyopathy: Secondary | ICD-10-CM | POA: Diagnosis not present

## 2021-02-12 DIAGNOSIS — I4892 Unspecified atrial flutter: Secondary | ICD-10-CM | POA: Diagnosis not present

## 2021-02-12 DIAGNOSIS — I480 Paroxysmal atrial fibrillation: Secondary | ICD-10-CM | POA: Diagnosis not present

## 2021-02-12 DIAGNOSIS — I214 Non-ST elevation (NSTEMI) myocardial infarction: Secondary | ICD-10-CM | POA: Diagnosis not present

## 2021-02-12 DIAGNOSIS — I10 Essential (primary) hypertension: Secondary | ICD-10-CM | POA: Diagnosis not present

## 2021-02-12 DIAGNOSIS — D500 Iron deficiency anemia secondary to blood loss (chronic): Secondary | ICD-10-CM | POA: Diagnosis not present

## 2021-02-17 DIAGNOSIS — E785 Hyperlipidemia, unspecified: Secondary | ICD-10-CM | POA: Diagnosis not present

## 2021-02-17 DIAGNOSIS — I480 Paroxysmal atrial fibrillation: Secondary | ICD-10-CM | POA: Diagnosis not present

## 2021-02-17 DIAGNOSIS — Z9889 Other specified postprocedural states: Secondary | ICD-10-CM | POA: Diagnosis not present

## 2021-02-17 DIAGNOSIS — I2583 Coronary atherosclerosis due to lipid rich plaque: Secondary | ICD-10-CM | POA: Diagnosis not present

## 2021-02-17 DIAGNOSIS — Z8679 Personal history of other diseases of the circulatory system: Secondary | ICD-10-CM | POA: Diagnosis not present

## 2021-02-17 DIAGNOSIS — I2510 Atherosclerotic heart disease of native coronary artery without angina pectoris: Secondary | ICD-10-CM | POA: Diagnosis not present

## 2021-02-17 DIAGNOSIS — I214 Non-ST elevation (NSTEMI) myocardial infarction: Secondary | ICD-10-CM | POA: Diagnosis not present

## 2021-03-08 ENCOUNTER — Other Ambulatory Visit: Payer: Self-pay | Admitting: Family Medicine

## 2021-05-03 ENCOUNTER — Other Ambulatory Visit: Payer: Self-pay | Admitting: Interventional Cardiology

## 2021-05-04 NOTE — Telephone Encounter (Signed)
Pt last saw Dr Irish Lack 12/22/20, last labs 01/28/21 Creat 1.0, age 80, weight 91.2kg, based on specified criteria pt is on appropriate dosage of Eliquis '5mg'$  BID.  Will refill rx.

## 2021-05-18 ENCOUNTER — Ambulatory Visit: Payer: PPO | Admitting: Interventional Cardiology

## 2021-05-18 DIAGNOSIS — R0602 Shortness of breath: Secondary | ICD-10-CM | POA: Diagnosis not present

## 2021-05-18 DIAGNOSIS — I25119 Atherosclerotic heart disease of native coronary artery with unspecified angina pectoris: Secondary | ICD-10-CM | POA: Diagnosis not present

## 2021-05-18 DIAGNOSIS — R079 Chest pain, unspecified: Secondary | ICD-10-CM | POA: Diagnosis not present

## 2021-05-18 DIAGNOSIS — I4891 Unspecified atrial fibrillation: Secondary | ICD-10-CM | POA: Diagnosis not present

## 2021-05-18 DIAGNOSIS — I517 Cardiomegaly: Secondary | ICD-10-CM | POA: Diagnosis not present

## 2021-05-18 DIAGNOSIS — I214 Non-ST elevation (NSTEMI) myocardial infarction: Secondary | ICD-10-CM | POA: Diagnosis not present

## 2021-05-18 DIAGNOSIS — Z87891 Personal history of nicotine dependence: Secondary | ICD-10-CM | POA: Diagnosis not present

## 2021-05-19 DIAGNOSIS — I4891 Unspecified atrial fibrillation: Secondary | ICD-10-CM | POA: Diagnosis not present

## 2021-05-25 DIAGNOSIS — I214 Non-ST elevation (NSTEMI) myocardial infarction: Secondary | ICD-10-CM | POA: Diagnosis not present

## 2021-05-25 DIAGNOSIS — I10 Essential (primary) hypertension: Secondary | ICD-10-CM | POA: Diagnosis not present

## 2021-05-25 DIAGNOSIS — I480 Paroxysmal atrial fibrillation: Secondary | ICD-10-CM | POA: Diagnosis not present

## 2021-05-25 DIAGNOSIS — Z8679 Personal history of other diseases of the circulatory system: Secondary | ICD-10-CM | POA: Diagnosis not present

## 2021-05-25 DIAGNOSIS — I2510 Atherosclerotic heart disease of native coronary artery without angina pectoris: Secondary | ICD-10-CM | POA: Diagnosis not present

## 2021-05-25 DIAGNOSIS — Z9889 Other specified postprocedural states: Secondary | ICD-10-CM | POA: Diagnosis not present

## 2021-05-25 DIAGNOSIS — E785 Hyperlipidemia, unspecified: Secondary | ICD-10-CM | POA: Diagnosis not present

## 2021-05-25 DIAGNOSIS — I2583 Coronary atherosclerosis due to lipid rich plaque: Secondary | ICD-10-CM | POA: Diagnosis not present

## 2021-06-02 ENCOUNTER — Other Ambulatory Visit: Payer: Self-pay | Admitting: Interventional Cardiology

## 2021-06-03 DIAGNOSIS — R1319 Other dysphagia: Secondary | ICD-10-CM | POA: Diagnosis not present

## 2021-06-03 DIAGNOSIS — K219 Gastro-esophageal reflux disease without esophagitis: Secondary | ICD-10-CM | POA: Diagnosis not present

## 2021-06-03 DIAGNOSIS — D508 Other iron deficiency anemias: Secondary | ICD-10-CM | POA: Diagnosis not present

## 2021-06-08 DIAGNOSIS — D508 Other iron deficiency anemias: Secondary | ICD-10-CM | POA: Diagnosis not present

## 2021-06-16 DIAGNOSIS — K5909 Other constipation: Secondary | ICD-10-CM | POA: Diagnosis not present

## 2021-06-16 DIAGNOSIS — R1310 Dysphagia, unspecified: Secondary | ICD-10-CM | POA: Diagnosis not present

## 2021-06-16 DIAGNOSIS — K219 Gastro-esophageal reflux disease without esophagitis: Secondary | ICD-10-CM | POA: Diagnosis not present

## 2021-06-18 DIAGNOSIS — R1310 Dysphagia, unspecified: Secondary | ICD-10-CM | POA: Diagnosis not present

## 2021-06-18 DIAGNOSIS — K2289 Other specified disease of esophagus: Secondary | ICD-10-CM | POA: Diagnosis not present

## 2021-06-18 DIAGNOSIS — K219 Gastro-esophageal reflux disease without esophagitis: Secondary | ICD-10-CM | POA: Diagnosis not present

## 2021-07-18 ENCOUNTER — Other Ambulatory Visit: Payer: Self-pay | Admitting: Interventional Cardiology

## 2021-07-28 DIAGNOSIS — R350 Frequency of micturition: Secondary | ICD-10-CM | POA: Diagnosis not present

## 2021-07-28 DIAGNOSIS — N400 Enlarged prostate without lower urinary tract symptoms: Secondary | ICD-10-CM | POA: Diagnosis not present

## 2021-08-18 DIAGNOSIS — K219 Gastro-esophageal reflux disease without esophagitis: Secondary | ICD-10-CM | POA: Diagnosis not present

## 2021-08-18 DIAGNOSIS — R9389 Abnormal findings on diagnostic imaging of other specified body structures: Secondary | ICD-10-CM | POA: Diagnosis not present

## 2021-08-25 DIAGNOSIS — R9389 Abnormal findings on diagnostic imaging of other specified body structures: Secondary | ICD-10-CM | POA: Diagnosis not present

## 2021-08-25 DIAGNOSIS — R911 Solitary pulmonary nodule: Secondary | ICD-10-CM | POA: Diagnosis not present

## 2021-08-25 DIAGNOSIS — I2510 Atherosclerotic heart disease of native coronary artery without angina pectoris: Secondary | ICD-10-CM | POA: Diagnosis not present

## 2021-08-25 DIAGNOSIS — I77810 Thoracic aortic ectasia: Secondary | ICD-10-CM | POA: Diagnosis not present

## 2021-08-25 DIAGNOSIS — I517 Cardiomegaly: Secondary | ICD-10-CM | POA: Diagnosis not present

## 2021-10-20 DIAGNOSIS — E041 Nontoxic single thyroid nodule: Secondary | ICD-10-CM | POA: Diagnosis not present

## 2021-10-20 DIAGNOSIS — E042 Nontoxic multinodular goiter: Secondary | ICD-10-CM | POA: Diagnosis not present

## 2021-11-04 DIAGNOSIS — I10 Essential (primary) hypertension: Secondary | ICD-10-CM | POA: Diagnosis not present

## 2021-11-04 DIAGNOSIS — E782 Mixed hyperlipidemia: Secondary | ICD-10-CM | POA: Diagnosis not present

## 2021-11-04 DIAGNOSIS — K219 Gastro-esophageal reflux disease without esophagitis: Secondary | ICD-10-CM | POA: Diagnosis not present

## 2021-11-23 ENCOUNTER — Encounter (HOSPITAL_BASED_OUTPATIENT_CLINIC_OR_DEPARTMENT_OTHER): Payer: Self-pay

## 2021-11-23 ENCOUNTER — Emergency Department (HOSPITAL_BASED_OUTPATIENT_CLINIC_OR_DEPARTMENT_OTHER): Payer: PPO

## 2021-11-23 ENCOUNTER — Emergency Department (HOSPITAL_BASED_OUTPATIENT_CLINIC_OR_DEPARTMENT_OTHER)
Admission: EM | Admit: 2021-11-23 | Discharge: 2021-11-23 | Disposition: A | Discharge status: Home / Self Care | Payer: PPO | Attending: Emergency Medicine | Admitting: Emergency Medicine

## 2021-11-23 ENCOUNTER — Other Ambulatory Visit: Payer: Self-pay

## 2021-11-23 DIAGNOSIS — M1990 Unspecified osteoarthritis, unspecified site: Secondary | ICD-10-CM

## 2021-11-23 DIAGNOSIS — M169 Osteoarthritis of hip, unspecified: Secondary | ICD-10-CM | POA: Insufficient documentation

## 2021-11-23 DIAGNOSIS — M7631 Iliotibial band syndrome, right leg: Secondary | ICD-10-CM

## 2021-11-23 DIAGNOSIS — I10 Essential (primary) hypertension: Secondary | ICD-10-CM | POA: Diagnosis not present

## 2021-11-23 DIAGNOSIS — M1611 Unilateral primary osteoarthritis, right hip: Secondary | ICD-10-CM | POA: Diagnosis not present

## 2021-11-23 DIAGNOSIS — M25561 Pain in right knee: Secondary | ICD-10-CM | POA: Diagnosis not present

## 2021-11-23 DIAGNOSIS — I2510 Atherosclerotic heart disease of native coronary artery without angina pectoris: Secondary | ICD-10-CM | POA: Insufficient documentation

## 2021-11-23 DIAGNOSIS — M25551 Pain in right hip: Secondary | ICD-10-CM | POA: Diagnosis present

## 2021-11-23 DIAGNOSIS — Z7901 Long term (current) use of anticoagulants: Secondary | ICD-10-CM | POA: Diagnosis not present

## 2021-11-23 DIAGNOSIS — Z79899 Other long term (current) drug therapy: Secondary | ICD-10-CM | POA: Diagnosis not present

## 2021-11-23 DIAGNOSIS — M47816 Spondylosis without myelopathy or radiculopathy, lumbar region: Secondary | ICD-10-CM | POA: Diagnosis not present

## 2021-11-23 DIAGNOSIS — M25751 Osteophyte, right hip: Secondary | ICD-10-CM | POA: Diagnosis not present

## 2021-11-23 MED ORDER — LIDOCAINE 5 % EX PTCH: 1.0000 | MEDICATED_PATCH | Freq: Every day | CUTANEOUS | 0 refills | Status: AC | PRN

## 2021-11-23 MED ORDER — LIDOCAINE 5 % EX PTCH
1.0000 | MEDICATED_PATCH | CUTANEOUS | Status: DC
  Administered 2021-11-23: 1 via TRANSDERMAL
  Filled 2021-11-23: qty 1

## 2021-11-23 MED ORDER — METHOCARBAMOL 500 MG PO TABS: 1000.0000 mg | ORAL_TABLET | Freq: Two times a day (BID) | ORAL | 0 refills | Status: AC

## 2021-11-23 MED ORDER — ACETAMINOPHEN 325 MG PO TABS: 650.0000 mg | ORAL_TABLET | Freq: Four times a day (QID) | ORAL | 0 refills | Status: AC | PRN

## 2021-11-23 NOTE — Discharge Instructions (Addendum)
It was a pleasure caring for you today in the emergency department. ° °Please return to the emergency department for any worsening or worrisome symptoms. ° ° °

## 2021-11-23 NOTE — ED Provider Notes (Signed)
[facility] Provider Note   CSN: [id number] Arrival date & time: 11/23/21  1459     History  Chief Complaint  Patient presents with   Leg Pain    Ronald Robinson is a 81 y.o. male.  This is a 81 y.o. male  with significant medical history as below, including CAD, hypertension, hyperlipidemia who presents to the ED with complaint of right hip pain  Location: Right hip lateral Duration: 2 days Onset: Gradual Timing: Constant Description: Aching, dull Severity: Mild Exacerbating/Alleviating Factors: Worse when walking, palpation Associated Symptoms: None reported Pertinent Negatives: No fevers, chills, back pain, nausea or vomiting, no gait disturbance, no recent orthopedic injections, no falls.  No chest pain or dyspnea     Past Medical History: 11/26/2016: CAD in native artery  No date: Enlarged prostate 12/04/2016: Hyperlipidemia LDL goal <70 No date: Hypertension  Past Surgical History: No date: HERNIA REPAIR 11/24/2016: LEFT HEART CATH AND CORONARY ANGIOGRAPHY; N/A     Comment:  Procedure: Left Heart Cath and Coronary Angiography;                Surgeon: Troy Sine, MD;  Location: [facility];  Service: Cardiovascular;  Laterality: N/A;    The history is provided by the patient and the spouse. No language interpreter was used.  Leg Pain Associated symptoms: no fever       Home Medications Prior to Admission medications   Medication Sig Start Date End Date Taking? Authorizing Provider  acetaminophen (TYLENOL) 325 MG tablet Take 2 tablets (650 mg total) by mouth every 6 (six) hours as needed. 11/23/21  Yes Wynona Dove A, DO  lidocaine (LIDODERM) 5 % Place 1 patch onto the skin daily as needed for up to 15 doses. Remove & Discard patch within 12 hours or as directed by MD 11/23/21  Yes Jeanell Sparrow, DO  methocarbamol (ROBAXIN) 500 MG tablet Take 2 tablets (1,000 mg total) by mouth 2 (two) times daily for 5  days. 11/23/21 11/28/21 Yes Jeanell Sparrow, DO  acetaminophen (TYLENOL) 325 MG tablet Take 2 tablets (650 mg total) by mouth every 4 (four) hours as needed for headache or mild pain. 11/26/16   Delos Haring, PA-C  amiodarone (PACERONE) 200 MG tablet Take 1 tablet by mouth once daily 12/30/20   Jettie Booze, MD  amitriptyline (ELAVIL) 10 MG tablet Take 1 tablet (10 mg total) by mouth at bedtime. 12/07/18   [name], DO  amLODipine (NORVASC) 2.5 MG tablet Take 1 tablet (2.5 mg total) by mouth daily. 12/22/20   Jettie Booze, MD  apixaban Arne Cleveland) 5 MG TABS tablet Take 1 tablet by mouth twice daily 05/04/21   Jettie Booze, MD  atorvastatin (LIPITOR) 80 MG tablet TAKE 1 TABLET BY MOUTH ONCE DAILY AT  6PM 11/10/20   [name], DO  fexofenadine (ALLEGRA) 180 MG tablet Take 1 tablet (180 mg total) by mouth daily. 03/21/17   [name], DO  finasteride (PROSCAR) 5 MG tablet Take 5 mg by mouth daily.    [provider]  furosemide (LASIX) 40 MG tablet Take 1 tablet (40 mg total) by mouth daily as needed (swelling). 03/03/20   Jettie Booze, MD  isosorbide mononitrate (IMDUR) 60 MG 24 hr tablet Take 1 tablet by mouth once daily 12/29/20   Jettie Booze, MD  Multiple Vitamin (MULTIVITAMIN) tablet  Take 1 tablet by mouth daily.    [provider]  nitroGLYCERIN (NITROSTAT) 0.4 MG SL tablet PLACE ONE TABLET UNDER THE TONGUE EVERY FIVE MINUTES FOR UP TO 3 DOSE AS NEEDED FOR CHEST PAIN. IF A SECOND DOSE IS TAKEN, CALL 911. 07/20/21   Jettie Booze, MD  pantoprazole (PROTONIX) 40 MG tablet Take 1 tablet by mouth once daily 06/02/21   Sherren Mocha, MD  potassium chloride SA (KLOR-CON) 20 MEQ tablet Take 1 tablet (20 mEq total) by mouth daily as needed (Take with furosemide (lasix) only). 03/03/20   Jettie Booze, MD  tamsulosin (FLOMAX) 0.4 MG CAPS capsule Take 0.4 mg by mouth daily.     [provider]   irbesartan (AVAPRO) 150 MG tablet Take 1/2 (one-half) tablet by mouth once daily 11/05/19 12/11/20  Jettie Booze, MD      Allergies    Patient has no known allergies.    Review of Systems   Review of Systems  Constitutional:  Negative for chills and fever.  HENT:  Negative for facial swelling and trouble swallowing.   Eyes:  Negative for photophobia and visual disturbance.  Respiratory:  Negative for cough and shortness of breath.   Cardiovascular:  Negative for chest pain and palpitations.  Gastrointestinal:  Negative for abdominal pain, nausea and vomiting.  Endocrine: Negative for polydipsia and polyuria.  Genitourinary:  Negative for difficulty urinating and hematuria.  Musculoskeletal:  Positive for arthralgias. Negative for gait problem and joint swelling.  Skin:  Negative for pallor and rash.  Neurological:  Negative for syncope and headaches.  Psychiatric/Behavioral:  Negative for agitation and confusion.    Physical Exam Updated Vital Signs BP (!) 156/88    Pulse 72    Temp 98.6 F (37 C) (Oral)    Resp 20    Wt 86.6 kg    SpO2 99%    BMI 28.21 kg/m  Physical Exam Vitals and nursing note reviewed.  Constitutional:      General: He is not in acute distress.    Appearance: He is well-developed.  HENT:     Head: Normocephalic and atraumatic.     Right Ear: External ear normal.     Left Ear: External ear normal.     Mouth/Throat:     Mouth: Mucous membranes are moist.  Eyes:     General: No scleral icterus. Cardiovascular:     Rate and Rhythm: Normal rate and regular rhythm.     Pulses: Normal pulses.     Heart sounds: Normal heart sounds.  Pulmonary:     Effort: Pulmonary effort is normal. No respiratory distress.     Breath sounds: Normal breath sounds.  Abdominal:     General: Abdomen is flat.     Palpations: Abdomen is soft.     Tenderness: There is no abdominal tenderness.  Musculoskeletal:        General: Normal range of motion.     Cervical back:  Normal range of motion.     Right lower leg: No edema.     Left lower leg: No edema.       Legs:     Comments: Discomfort along lateral portion of right thigh.  No acetabular tenderness.  No range of motion reduction.  2+ DP pulses equal bilateral lower extremities.  Skin:    General: Skin is warm and dry.     Capillary Refill: Capillary refill takes less than 2 seconds.  Neurological:     Mental  Status: He is alert and oriented to person, place, and time.     GCS: GCS eye subscore is 4. GCS verbal subscore is 5. GCS motor subscore is 6.  Psychiatric:        Mood and Affect: Mood normal.        Behavior: Behavior normal.    ED Results / Procedures / Treatments   Labs (all labs ordered are listed, but only abnormal results are displayed) Labs Reviewed - No data to display  EKG None  Radiology DG Knee Complete 4 Views Right  Result Date: 11/23/2021 CLINICAL DATA:  Leg pain.  No known trauma. EXAM: RIGHT KNEE - COMPLETE 4+ VIEW COMPARISON:  None. FINDINGS: Moderate severe medial compartment joint space narrowing. Moderate peripheral medial compartment degenerative osteophytosis. Moderate patellofemoral joint space narrowing and peripheral osteophytosis degenerative change. No knee joint effusion. No acute fracture or dislocation. There is a chronic appearing deformity of the proximal tibial metaphysis and diaphysis, with bone enlargement and moderate cortical thickening as well as an area of chronic appearing well-circumscribed centrally lucent peripherally sclerotic bone within the proximal tibial diaphysis on lateral view measuring up to approximately 19 mm. No acute fracture or dislocation. Vascular calcifications are noted. IMPRESSION:: IMPRESSION: 1. 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 2. Chronic appearing deformity of the proximal tibial metaphysis and diaphysis, as described above. This may represent the sequela of remote  trauma. No definite aggressive features are seen. On the PACS, prior radiographs are listed as performed 07/17/2015 however these images and the report are not available to me. Recommend clinical correlation with patient history and prior radiographs to assess stability of this finding. Electronically Signed   By: Yvonne Kendall M.D.   On: 11/23/2021 17:12   DG Hip Unilat W or Wo Pelvis 1 View Right  Result Date: 11/23/2021 CLINICAL DATA:  Pain.  No trauma per patient. EXAM: DG HIP (WITH OR WITHOUT PELVIS) 1V RIGHT COMPARISON:  None. FINDINGS: There is diffuse decreased bone mineralization. Mild bilateral sacroiliac and femoroacetabular joint space narrowing and subchondral sclerosis. Mild bilateral superolateral acetabular degenerative osteophytosis. No acute fracture or dislocation. Moderate right-greater-than-left L4-5 and L3-4 degenerative disc changes. 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 Electronically Signed   By: Yvonne Kendall M.D.   On: 11/23/2021 17:14    Procedures Procedures    Medications Ordered in ED Medications  lidocaine (LIDODERM) 5 % 1 patch (1 patch Transdermal Patch Applied 11/23/21 1735)    ED Course/ Medical Decision Making/ A&P                           Medical Decision Making Amount and/or Complexity of Data Reviewed Radiology: ordered.  Risk OTC drugs. Prescription drug management.    CC: Right leg pain  This patient presents to the Emergency Department for the above complaint. This involves an extensive number of treatment options and is a complaint that carries with it a high risk of complications and morbidity. Vital signs were reviewed. Serious etiologies considered.  Record review:  Previous records obtained and reviewed   Additional history obtained from spouse  Medical and surgical history as noted above.   Work up as above, notable for:  imaging results that were available during my care of the  patient were reviewed by me and considered in my medical decision making.   I ordered imaging studies which included right knee x-ray, right hip x-ray and I reviewed  imaging which showed right-sided osteoarthritis, chronic deformity to proximal tibia metaphysis diaphysis.  Social determinants of health include - N/a  Management: Lidocaine patch  Reassessment:  Patient reports feeling better.  Ambulatory.  Neurovascularly intact bilateral lower extremities  Panic secondary to arthritis versus IT band syndrome.  Discussed supportive care at home.  Multimodal pain control.  Follow-up sports medicine.  Return if worse  The patient improved significantly and was discharged in stable condition. Detailed discussions were had with the patient regarding current findings, and need for close f/u with PCP or on call doctor. The patient has been instructed to return immediately if the symptoms worsen in any way for re-evaluation. Patient verbalized understanding and is in agreement with current care plan. All questions answered prior to discharge.         This chart was dictated using voice recognition software.  Despite best efforts to proofread,  errors can occur which can change the documentation meaning.         Final Clinical Impression(s) / ED Diagnoses Final diagnoses:  Osteoarthritis, unspecified osteoarthritis type, unspecified site  It band syndrome, right    Rx / DC Orders ED Discharge Orders          Ordered    lidocaine (LIDODERM) 5 %  Daily PRN        11/23/21 1730    methocarbamol (ROBAXIN) 500 MG tablet  2 times daily        11/23/21 1730    acetaminophen (TYLENOL) 325 MG tablet  Every 6 hours PRN        11/23/21 1730              Jeanell Sparrow, DO 11/23/21 1735

## 2021-11-23 NOTE — ED Triage Notes (Addendum)
Pt c/o pain to right leg from lateral knee up to hip area x 2 days-denies injury-NAD-steady gait with own cane

## 2021-11-30 DIAGNOSIS — M7631 Iliotibial band syndrome, right leg: Secondary | ICD-10-CM | POA: Diagnosis not present

## 2021-11-30 DIAGNOSIS — Z9989 Dependence on other enabling machines and devices: Secondary | ICD-10-CM | POA: Diagnosis not present

## 2021-11-30 DIAGNOSIS — M79604 Pain in right leg: Secondary | ICD-10-CM | POA: Diagnosis not present

## 2021-11-30 DIAGNOSIS — I10 Essential (primary) hypertension: Secondary | ICD-10-CM | POA: Diagnosis not present

## 2021-11-30 DIAGNOSIS — R262 Difficulty in walking, not elsewhere classified: Secondary | ICD-10-CM | POA: Diagnosis not present

## 2021-12-02 ENCOUNTER — Ambulatory Visit: Payer: PPO | Admitting: Family Medicine

## 2021-12-02 ENCOUNTER — Encounter: Payer: Self-pay | Admitting: Family Medicine

## 2021-12-02 VITALS — BP 120/78 | Ht 69.0 in | Wt 191.0 lb

## 2021-12-02 DIAGNOSIS — M5416 Radiculopathy, lumbar region: Secondary | ICD-10-CM | POA: Insufficient documentation

## 2021-12-02 NOTE — Progress Notes (Signed)
°  Ronald Robinson - 81 y.o. male MRN [id number]  Date of birth: 24-Nov-1940  SUBJECTIVE:  Including CC & ROS.  No chief complaint on file.   Ronald Robinson is a 81 y.o. male that is presenting with right leg pain.  The pain is been present for 2 weeks.  Denies any inciting event.  He has pain with transitioning from sitting to standing.  Has worse pain with prolonged standing.  Review of the note from 2/13 shows he was provided Lidoderm patches, Robaxin and Tylenol. Independent review of the right hip x-ray from 2/13 shows mild degenerative changes of the right hip joint. Independent review of the right knee x-ray from 2/13 shows moderate degenerative changes in the medial compartment.  Review of Systems See HPI   HISTORY: Past Medical, Surgical, Social, and Family History Reviewed & Updated per EMR.   Pertinent Historical Findings include:  Past Medical History:  Diagnosis Date   CAD in native artery  11/26/2016   Enlarged prostate    Hyperlipidemia LDL goal <70 12/04/2016   Hypertension     Past Surgical History:  Procedure Laterality Date   HERNIA REPAIR     LEFT HEART CATH AND CORONARY ANGIOGRAPHY N/A 11/24/2016   Procedure: Left Heart Cath and Coronary Angiography;  Surgeon: Troy Sine, MD;  Location: [facility];  Service: Cardiovascular;  Laterality: N/A;     PHYSICAL EXAM:  VS: BP 120/78 (BP Location: Left Arm, Patient Position: Sitting)    Ht 5\' 9"  (1.753 m)    Wt 191 lb (86.6 kg)    BMI 28.21 kg/m  Physical Exam Gen: NAD, alert, cooperative with exam, well-appearing MSK:  Neurovascularly intact       ASSESSMENT & PLAN:   Lumbar radiculopathy Acutely occurring.  Symptoms were consistent with radicular origin.  Less likely for hip pathology as the source.  Does have degenerative changes of the lumbar spine previously observed on x-ray. -Counseled on home exercise therapy and supportive care. -Counseled on Robaxin and Tylenol. -Could consider further  imaging or physical therapy.

## 2021-12-02 NOTE — Assessment & Plan Note (Signed)
Acutely occurring.  Symptoms were consistent with radicular origin.  Less likely for hip pathology as the source.  Does have degenerative changes of the lumbar spine previously observed on x-ray. -Counseled on home exercise therapy and supportive care. -Counseled on Robaxin and Tylenol. -Could consider further imaging or physical therapy.

## 2021-12-02 NOTE — Patient Instructions (Signed)
Nice to meet you Please try heat  Please try the exercises   Please send me a message in MyChart with any questions or updates.  Please see me back in 4-6 weeks.   --Dr. Raeford Razor

## 2021-12-03 ENCOUNTER — Ambulatory Visit (HOSPITAL_BASED_OUTPATIENT_CLINIC_OR_DEPARTMENT_OTHER): Payer: PPO | Admitting: General Practice

## 2021-12-30 ENCOUNTER — Ambulatory Visit: Payer: PPO | Admitting: Family Medicine

## 2022-02-03 DIAGNOSIS — R079 Chest pain, unspecified: Secondary | ICD-10-CM | POA: Diagnosis not present

## 2022-02-03 DIAGNOSIS — I4892 Unspecified atrial flutter: Secondary | ICD-10-CM | POA: Diagnosis not present

## 2022-02-03 DIAGNOSIS — I517 Cardiomegaly: Secondary | ICD-10-CM | POA: Diagnosis not present

## 2022-02-03 DIAGNOSIS — R0609 Other forms of dyspnea: Secondary | ICD-10-CM | POA: Diagnosis not present

## 2022-02-03 DIAGNOSIS — K219 Gastro-esophageal reflux disease without esophagitis: Secondary | ICD-10-CM | POA: Diagnosis not present

## 2022-02-03 DIAGNOSIS — I444 Left anterior fascicular block: Secondary | ICD-10-CM | POA: Diagnosis not present

## 2022-02-04 DIAGNOSIS — I4892 Unspecified atrial flutter: Secondary | ICD-10-CM | POA: Diagnosis not present

## 2022-02-05 DIAGNOSIS — R079 Chest pain, unspecified: Secondary | ICD-10-CM | POA: Diagnosis not present

## 2022-02-15 DIAGNOSIS — K219 Gastro-esophageal reflux disease without esophagitis: Secondary | ICD-10-CM | POA: Diagnosis not present

## 2022-02-15 DIAGNOSIS — R0789 Other chest pain: Secondary | ICD-10-CM | POA: Diagnosis not present

## 2022-03-04 ENCOUNTER — Other Ambulatory Visit: Payer: Self-pay | Admitting: Interventional Cardiology

## 2022-03-04 NOTE — Telephone Encounter (Signed)
Pt cancelled 4-6 month f/u with Dr Irish Lack 05/18/21, and has been following with Dr Loni Beckwith at [facility] cardiology.  Attempted to call pt, spoke with pt's wife she states pt is no longer seeing Dr Irish Lack and is seeing Dr Elonda Husky and the pharmacy was supposed to send the Eliquis refill request to Dr Valentina Lucks office.  Refused Eliquis refill and noted to please forward refill request to Dr Elonda Husky at [facility] cardiology.  Also asked pt's wife to contact their pharmacy and request rx be sent there as well.

## 2022-03-12 ENCOUNTER — Other Ambulatory Visit: Payer: Self-pay | Admitting: Interventional Cardiology

## 2022-03-12 NOTE — Telephone Encounter (Signed)
Per last refill request, pt is no longer a pt of Dr Beau Fanny.  Pt seeing Dr Loni Beckwith at [facility] now.  Declined refill on 03/04/22, after speaking with pt's wife, and sent note to pharmacy to forward refill request to pt's new cardiologist Dr Elonda Husky. Will refuse refill again.

## 2022-03-18 DIAGNOSIS — H40023 Open angle with borderline findings, high risk, bilateral: Secondary | ICD-10-CM | POA: Diagnosis not present

## 2022-03-18 DIAGNOSIS — H2513 Age-related nuclear cataract, bilateral: Secondary | ICD-10-CM | POA: Diagnosis not present

## 2022-06-03 ENCOUNTER — Other Ambulatory Visit: Payer: Self-pay | Admitting: Cardiovascular Disease

## 2022-06-10 ENCOUNTER — Other Ambulatory Visit: Payer: Self-pay

## 2022-06-10 MED ORDER — PANTOPRAZOLE SODIUM 40 MG PO TBEC: 40.0000 mg | DELAYED_RELEASE_TABLET | Freq: Every day | ORAL | 0 refills | Status: AC

## 2022-06-28 NOTE — Telephone Encounter (Signed)
OK to refill

## 2022-08-17 DIAGNOSIS — Z09 Encounter for follow-up examination after completed treatment for conditions other than malignant neoplasm: Secondary | ICD-10-CM | POA: Diagnosis not present

## 2022-08-17 DIAGNOSIS — R972 Elevated prostate specific antigen [PSA]: Secondary | ICD-10-CM | POA: Diagnosis not present

## 2022-09-01 ENCOUNTER — Other Ambulatory Visit: Payer: Self-pay | Admitting: Interventional Cardiology

## 2023-01-26 ENCOUNTER — Encounter: Payer: Self-pay | Admitting: *Deleted

## 2023-04-11 DEATH — deceased
